# Patient Record
Sex: Male | Born: 1935 | Race: White | Hispanic: No | Marital: Married | Smoking: Former smoker
Health system: Southern US, Community
[De-identification: ages and names within clinical notes are randomized; demographics above are authoritative.]

## PROBLEM LIST (undated history)

## (undated) DIAGNOSIS — N184 Chronic kidney disease, stage 4 (severe): Secondary | ICD-10-CM

## (undated) DIAGNOSIS — I2510 Atherosclerotic heart disease of native coronary artery without angina pectoris: Secondary | ICD-10-CM

## (undated) DIAGNOSIS — E1142 Type 2 diabetes mellitus with diabetic polyneuropathy: Secondary | ICD-10-CM

## (undated) DIAGNOSIS — Z8673 Personal history of transient ischemic attack (TIA), and cerebral infarction without residual deficits: Secondary | ICD-10-CM

## (undated) DIAGNOSIS — E785 Hyperlipidemia, unspecified: Secondary | ICD-10-CM

## (undated) DIAGNOSIS — J449 Chronic obstructive pulmonary disease, unspecified: Secondary | ICD-10-CM

## (undated) DIAGNOSIS — N400 Enlarged prostate without lower urinary tract symptoms: Secondary | ICD-10-CM

## (undated) DIAGNOSIS — G4733 Obstructive sleep apnea (adult) (pediatric): Secondary | ICD-10-CM

## (undated) DIAGNOSIS — I5042 Chronic combined systolic (congestive) and diastolic (congestive) heart failure: Secondary | ICD-10-CM

## (undated) DIAGNOSIS — Z87442 Personal history of urinary calculi: Secondary | ICD-10-CM

## (undated) DIAGNOSIS — I4510 Unspecified right bundle-branch block: Secondary | ICD-10-CM

## (undated) DIAGNOSIS — Z8669 Personal history of other diseases of the nervous system and sense organs: Secondary | ICD-10-CM

## (undated) DIAGNOSIS — I252 Old myocardial infarction: Secondary | ICD-10-CM

## (undated) DIAGNOSIS — R350 Frequency of micturition: Secondary | ICD-10-CM

## (undated) DIAGNOSIS — K219 Gastro-esophageal reflux disease without esophagitis: Secondary | ICD-10-CM

## (undated) DIAGNOSIS — I493 Ventricular premature depolarization: Secondary | ICD-10-CM

## (undated) DIAGNOSIS — M1990 Unspecified osteoarthritis, unspecified site: Secondary | ICD-10-CM

## (undated) DIAGNOSIS — I10 Essential (primary) hypertension: Secondary | ICD-10-CM

## (undated) DIAGNOSIS — C679 Malignant neoplasm of bladder, unspecified: Secondary | ICD-10-CM

## (undated) DIAGNOSIS — E119 Type 2 diabetes mellitus without complications: Secondary | ICD-10-CM

## (undated) DIAGNOSIS — Z87898 Personal history of other specified conditions: Secondary | ICD-10-CM

## (undated) HISTORY — DX: Ventricular premature depolarization: I49.3

## (undated) HISTORY — DX: Atherosclerotic heart disease of native coronary artery without angina pectoris: I25.10

## (undated) HISTORY — PX: APPENDECTOMY: SHX54

## (undated) HISTORY — DX: Malignant neoplasm of bladder, unspecified: C67.9

## (undated) HISTORY — PX: CARDIAC CATHETERIZATION: SHX172

## (undated) HISTORY — DX: Benign prostatic hyperplasia without lower urinary tract symptoms: N40.0

## (undated) HISTORY — PX: CORONARY ARTERY BYPASS GRAFT: SHX141

---

## 1996-01-09 HISTORY — PX: TRANSURETHRAL RESECTION OF PROSTATE: SHX73

## 1998-10-10 ENCOUNTER — Ambulatory Visit (HOSPITAL_BASED_OUTPATIENT_CLINIC_OR_DEPARTMENT_OTHER): Admission: RE | Admit: 1998-10-10 | Discharge: 1998-10-10 | Payer: Self-pay | Admitting: Orthopedic Surgery

## 2004-05-01 ENCOUNTER — Encounter: Admission: RE | Admit: 2004-05-01 | Discharge: 2004-05-01 | Payer: Self-pay | Admitting: Family Medicine

## 2008-01-09 HISTORY — PX: RETINAL DETACHMENT SURGERY: SHX105

## 2008-01-09 HISTORY — PX: CATARACT EXTRACTION W/ INTRAOCULAR LENS  IMPLANT, BILATERAL: SHX1307

## 2008-12-16 ENCOUNTER — Encounter: Payer: Self-pay | Admitting: Emergency Medicine

## 2008-12-16 ENCOUNTER — Ambulatory Visit: Payer: Self-pay | Admitting: Cardiology

## 2008-12-16 ENCOUNTER — Encounter: Payer: Self-pay | Admitting: Cardiology

## 2008-12-16 ENCOUNTER — Inpatient Hospital Stay (HOSPITAL_COMMUNITY): Admission: AD | Admit: 2008-12-16 | Discharge: 2008-12-21 | Payer: Self-pay | Admitting: Cardiology

## 2008-12-16 ENCOUNTER — Encounter: Payer: Self-pay | Admitting: Cardiovascular Disease

## 2008-12-17 ENCOUNTER — Ambulatory Visit: Payer: Self-pay | Admitting: Cardiothoracic Surgery

## 2008-12-17 ENCOUNTER — Encounter: Payer: Self-pay | Admitting: Cardiology

## 2008-12-19 ENCOUNTER — Encounter (INDEPENDENT_AMBULATORY_CARE_PROVIDER_SITE_OTHER): Payer: Self-pay | Admitting: Internal Medicine

## 2008-12-20 ENCOUNTER — Encounter: Payer: Self-pay | Admitting: Cardiology

## 2008-12-21 ENCOUNTER — Encounter: Payer: Self-pay | Admitting: Cardiology

## 2008-12-22 ENCOUNTER — Telehealth: Payer: Self-pay | Admitting: Cardiology

## 2008-12-23 ENCOUNTER — Ambulatory Visit: Payer: Self-pay | Admitting: Cardiology

## 2008-12-28 ENCOUNTER — Telehealth: Payer: Self-pay | Admitting: Cardiology

## 2008-12-28 ENCOUNTER — Encounter: Payer: Self-pay | Admitting: Cardiology

## 2009-01-05 DIAGNOSIS — E1165 Type 2 diabetes mellitus with hyperglycemia: Secondary | ICD-10-CM

## 2009-01-05 DIAGNOSIS — J984 Other disorders of lung: Secondary | ICD-10-CM

## 2009-01-05 DIAGNOSIS — E785 Hyperlipidemia, unspecified: Secondary | ICD-10-CM | POA: Insufficient documentation

## 2009-01-05 DIAGNOSIS — I10 Essential (primary) hypertension: Secondary | ICD-10-CM | POA: Insufficient documentation

## 2009-01-05 DIAGNOSIS — E118 Type 2 diabetes mellitus with unspecified complications: Secondary | ICD-10-CM

## 2009-01-05 DIAGNOSIS — I6350 Cerebral infarction due to unspecified occlusion or stenosis of unspecified cerebral artery: Secondary | ICD-10-CM

## 2009-01-05 DIAGNOSIS — E1149 Type 2 diabetes mellitus with other diabetic neurological complication: Secondary | ICD-10-CM | POA: Insufficient documentation

## 2009-01-05 DIAGNOSIS — Z87898 Personal history of other specified conditions: Secondary | ICD-10-CM

## 2009-01-05 DIAGNOSIS — Z87891 Personal history of nicotine dependence: Secondary | ICD-10-CM

## 2009-01-05 DIAGNOSIS — I214 Non-ST elevation (NSTEMI) myocardial infarction: Secondary | ICD-10-CM

## 2009-01-05 DIAGNOSIS — M19019 Primary osteoarthritis, unspecified shoulder: Secondary | ICD-10-CM | POA: Insufficient documentation

## 2009-01-06 ENCOUNTER — Ambulatory Visit: Payer: Self-pay | Admitting: Cardiology

## 2009-01-06 DIAGNOSIS — I5032 Chronic diastolic (congestive) heart failure: Secondary | ICD-10-CM

## 2009-01-12 ENCOUNTER — Encounter: Payer: Self-pay | Admitting: Cardiology

## 2009-02-02 ENCOUNTER — Encounter: Payer: Self-pay | Admitting: Cardiology

## 2009-02-02 ENCOUNTER — Ambulatory Visit: Payer: Self-pay | Admitting: Cardiothoracic Surgery

## 2009-02-02 ENCOUNTER — Encounter: Admission: RE | Admit: 2009-02-02 | Discharge: 2009-02-02 | Payer: Self-pay | Admitting: Cardiothoracic Surgery

## 2009-02-07 ENCOUNTER — Inpatient Hospital Stay (HOSPITAL_COMMUNITY): Admission: RE | Admit: 2009-02-07 | Discharge: 2009-02-12 | Payer: Self-pay | Admitting: Cardiothoracic Surgery

## 2009-02-07 ENCOUNTER — Ambulatory Visit: Payer: Self-pay | Admitting: Cardiothoracic Surgery

## 2009-02-16 ENCOUNTER — Encounter: Payer: Self-pay | Admitting: Cardiology

## 2009-02-17 ENCOUNTER — Telehealth (INDEPENDENT_AMBULATORY_CARE_PROVIDER_SITE_OTHER): Payer: Self-pay | Admitting: *Deleted

## 2009-02-23 ENCOUNTER — Encounter: Admission: RE | Admit: 2009-02-23 | Discharge: 2009-02-23 | Payer: Self-pay | Admitting: Cardiothoracic Surgery

## 2009-02-23 ENCOUNTER — Ambulatory Visit: Payer: Self-pay | Admitting: Cardiothoracic Surgery

## 2009-02-25 ENCOUNTER — Ambulatory Visit: Payer: Self-pay | Admitting: Cardiology

## 2009-02-25 DIAGNOSIS — I2510 Atherosclerotic heart disease of native coronary artery without angina pectoris: Secondary | ICD-10-CM | POA: Insufficient documentation

## 2009-03-02 ENCOUNTER — Encounter: Admission: RE | Admit: 2009-03-02 | Discharge: 2009-03-02 | Payer: Self-pay | Admitting: Cardiothoracic Surgery

## 2009-03-02 ENCOUNTER — Ambulatory Visit: Payer: Self-pay | Admitting: Cardiothoracic Surgery

## 2009-03-10 ENCOUNTER — Encounter (HOSPITAL_COMMUNITY): Admission: RE | Admit: 2009-03-10 | Discharge: 2009-06-08 | Payer: Self-pay | Admitting: Cardiology

## 2009-03-16 ENCOUNTER — Ambulatory Visit: Payer: Self-pay | Admitting: Cardiothoracic Surgery

## 2009-03-16 ENCOUNTER — Encounter: Admission: RE | Admit: 2009-03-16 | Discharge: 2009-03-16 | Payer: Self-pay | Admitting: Cardiothoracic Surgery

## 2009-03-25 ENCOUNTER — Ambulatory Visit: Payer: Self-pay | Admitting: Cardiology

## 2009-03-28 ENCOUNTER — Telehealth: Payer: Self-pay | Admitting: Cardiology

## 2009-03-28 ENCOUNTER — Ambulatory Visit: Payer: Self-pay | Admitting: Cardiology

## 2009-03-28 ENCOUNTER — Inpatient Hospital Stay (HOSPITAL_COMMUNITY): Admission: EM | Admit: 2009-03-28 | Discharge: 2009-03-31 | Payer: Self-pay | Admitting: Emergency Medicine

## 2009-03-28 LAB — CONVERTED CEMR LAB
CO2: 28 meq/L (ref 19–32)
Chloride: 107 meq/L (ref 96–112)
GFR calc non Af Amer: 88.07 mL/min (ref >60)
Pro B Natriuretic peptide (BNP): 1106 pg/mL — ABNORMAL HIGH (ref 0.0–100.0)

## 2009-03-29 ENCOUNTER — Encounter: Payer: Self-pay | Admitting: Cardiology

## 2009-04-04 ENCOUNTER — Encounter: Payer: Self-pay | Admitting: Cardiology

## 2009-04-05 ENCOUNTER — Ambulatory Visit: Payer: Self-pay | Admitting: Cardiology

## 2009-04-06 LAB — CONVERTED CEMR LAB
ALT: 18 units/L (ref 0–53)
AST: 15 units/L (ref 0–37)
Albumin: 3.1 g/dL — ABNORMAL LOW (ref 3.5–5.2)
Alkaline Phosphatase: 77 units/L (ref 39–117)
BUN: 20 mg/dL (ref 6–23)
CO2: 30 meq/L (ref 19–32)
Calcium: 9.2 mg/dL (ref 8.4–10.5)
GFR calc non Af Amer: 48.84 mL/min (ref >60)
Glucose, Bld: 202 mg/dL — ABNORMAL HIGH (ref 70–99)
LDL Cholesterol: 87 mg/dL (ref 0–99)
Total Protein: 6.7 g/dL (ref 6.0–8.3)
Triglycerides: 131 mg/dL (ref 0.0–149.0)

## 2009-04-08 ENCOUNTER — Telehealth: Payer: Self-pay | Admitting: Cardiology

## 2009-04-09 ENCOUNTER — Emergency Department (HOSPITAL_COMMUNITY): Admission: EM | Admit: 2009-04-09 | Discharge: 2009-04-10 | Payer: Self-pay | Admitting: Emergency Medicine

## 2009-04-14 ENCOUNTER — Telehealth: Payer: Self-pay | Admitting: Cardiology

## 2009-04-22 ENCOUNTER — Telehealth: Payer: Self-pay | Admitting: Cardiology

## 2009-05-04 ENCOUNTER — Telehealth (INDEPENDENT_AMBULATORY_CARE_PROVIDER_SITE_OTHER): Payer: Self-pay | Admitting: *Deleted

## 2009-05-04 ENCOUNTER — Encounter: Payer: Self-pay | Admitting: Cardiology

## 2009-05-17 ENCOUNTER — Ambulatory Visit: Payer: Self-pay | Admitting: Cardiology

## 2009-05-17 DIAGNOSIS — M79609 Pain in unspecified limb: Secondary | ICD-10-CM

## 2009-05-19 ENCOUNTER — Encounter: Payer: Self-pay | Admitting: Cardiology

## 2009-05-19 ENCOUNTER — Telehealth (INDEPENDENT_AMBULATORY_CARE_PROVIDER_SITE_OTHER): Payer: Self-pay | Admitting: *Deleted

## 2009-05-31 ENCOUNTER — Encounter: Payer: Self-pay | Admitting: Cardiology

## 2009-06-09 ENCOUNTER — Encounter (HOSPITAL_COMMUNITY): Admission: RE | Admit: 2009-06-09 | Discharge: 2009-06-17 | Payer: Self-pay | Admitting: Cardiology

## 2009-06-24 ENCOUNTER — Telehealth: Payer: Self-pay | Admitting: Cardiology

## 2009-07-13 ENCOUNTER — Ambulatory Visit (HOSPITAL_COMMUNITY): Admission: RE | Admit: 2009-07-13 | Discharge: 2009-07-13 | Payer: Self-pay | Admitting: Urology

## 2009-07-13 HISTORY — PX: OTHER SURGICAL HISTORY: SHX169

## 2009-07-22 ENCOUNTER — Encounter: Payer: Self-pay | Admitting: Cardiology

## 2009-08-15 ENCOUNTER — Ambulatory Visit: Payer: Self-pay | Admitting: Cardiology

## 2009-08-15 DIAGNOSIS — R55 Syncope and collapse: Secondary | ICD-10-CM | POA: Insufficient documentation

## 2009-08-17 ENCOUNTER — Encounter: Payer: Self-pay | Admitting: Cardiology

## 2009-08-17 ENCOUNTER — Ambulatory Visit: Payer: Self-pay

## 2009-08-22 LAB — CONVERTED CEMR LAB
Albumin: 3.6 g/dL (ref 3.5–5.2)
Alkaline Phosphatase: 79 units/L (ref 39–117)
Bilirubin, Direct: 0.1 mg/dL (ref 0.0–0.3)
Calcium: 9.5 mg/dL (ref 8.4–10.5)
Cholesterol: 238 mg/dL — ABNORMAL HIGH (ref 0–200)
Glucose, Bld: 148 mg/dL — ABNORMAL HIGH (ref 70–99)
HDL: 33.7 mg/dL — ABNORMAL LOW (ref >39.00)
Sodium: 144 meq/L (ref 135–145)
Total CHOL/HDL Ratio: 7
VLDL: 46.4 mg/dL — ABNORMAL HIGH (ref 0.0–40.0)

## 2009-08-30 ENCOUNTER — Encounter: Payer: Self-pay | Admitting: Cardiology

## 2009-09-05 ENCOUNTER — Encounter: Payer: Self-pay | Admitting: Cardiology

## 2009-09-07 ENCOUNTER — Ambulatory Visit: Payer: Self-pay | Admitting: Cardiology

## 2009-09-15 ENCOUNTER — Telehealth: Payer: Self-pay | Admitting: Cardiology

## 2009-09-15 LAB — CONVERTED CEMR LAB
BUN: 25 mg/dL — ABNORMAL HIGH (ref 6–23)
CO2: 32 meq/L (ref 19–32)
Calcium: 9.5 mg/dL (ref 8.4–10.5)
Chloride: 92 meq/L — ABNORMAL LOW (ref 96–112)
Creatinine, Ser: 1.4 mg/dL (ref 0.4–1.5)
GFR calc non Af Amer: 51.97 mL/min (ref >60)
Glucose, Bld: 273 mg/dL — ABNORMAL HIGH (ref 70–99)
Potassium: 2.7 meq/L — CL (ref 3.5–5.1)
Pro B Natriuretic peptide (BNP): 120 pg/mL — ABNORMAL HIGH (ref 0.0–100.0)
Sodium: 136 meq/L (ref 135–145)

## 2009-09-19 ENCOUNTER — Ambulatory Visit: Payer: Self-pay | Admitting: Cardiology

## 2009-09-27 LAB — CONVERTED CEMR LAB
GFR calc non Af Amer: 61.91 mL/min (ref >60)
Glucose, Bld: 276 mg/dL — ABNORMAL HIGH (ref 70–99)
Potassium: 4.7 meq/L (ref 3.5–5.1)
Sodium: 137 meq/L (ref 135–145)

## 2009-10-19 ENCOUNTER — Ambulatory Visit: Payer: Self-pay

## 2009-12-13 ENCOUNTER — Ambulatory Visit: Payer: Self-pay | Admitting: Cardiology

## 2009-12-21 ENCOUNTER — Encounter: Payer: Self-pay | Admitting: Cardiology

## 2009-12-23 ENCOUNTER — Ambulatory Visit: Payer: Self-pay | Admitting: Cardiology

## 2009-12-26 LAB — CONVERTED CEMR LAB
BUN: 11 mg/dL (ref 6–23)
Calcium: 9 mg/dL (ref 8.4–10.5)
Chloride: 103 meq/L (ref 96–112)
Creatinine, Ser: 0.9 mg/dL (ref 0.4–1.5)
Pro B Natriuretic peptide (BNP): 918.5 pg/mL — ABNORMAL HIGH (ref 0.0–100.0)

## 2009-12-28 ENCOUNTER — Encounter: Payer: Self-pay | Admitting: Cardiology

## 2010-01-03 ENCOUNTER — Telehealth: Payer: Self-pay | Admitting: Cardiology

## 2010-01-05 ENCOUNTER — Ambulatory Visit: Payer: Self-pay | Admitting: Cardiology

## 2010-01-11 ENCOUNTER — Telehealth: Payer: Self-pay | Admitting: Cardiology

## 2010-01-11 LAB — CONVERTED CEMR LAB
BUN: 23 mg/dL
CO2: 30 meq/L
Calcium: 9.6 mg/dL
Chloride: 95 meq/L — ABNORMAL LOW
Creatinine, Ser: 1.3 mg/dL
GFR calc non Af Amer: 56.98 mL/min — ABNORMAL LOW
Glucose, Bld: 322 mg/dL — ABNORMAL HIGH
Potassium: 5.3 meq/L — ABNORMAL HIGH
Pro B Natriuretic peptide (BNP): 199.1 pg/mL — ABNORMAL HIGH
Sodium: 133 meq/L — ABNORMAL LOW

## 2010-01-20 ENCOUNTER — Ambulatory Visit: Admit: 2010-01-20 | Payer: Self-pay

## 2010-02-05 LAB — CONVERTED CEMR LAB
BUN: 17 mg/dL (ref 6–23)
CO2: 27 meq/L (ref 19–32)
CO2: 29 meq/L (ref 19–32)
Calcium: 10 mg/dL (ref 8.4–10.5)
Calcium: 9.3 mg/dL (ref 8.4–10.5)
Chloride: 101 meq/L (ref 96–112)
Chloride: 103 meq/L (ref 96–112)
GFR calc non Af Amer: 57.65 mL/min (ref >60)
Glucose, Bld: 185 mg/dL — ABNORMAL HIGH (ref 70–99)
Glucose, Bld: 187 mg/dL — ABNORMAL HIGH (ref 70–99)
Potassium: 4.1 meq/L (ref 3.5–5.1)
Potassium: 4.1 meq/L (ref 3.5–5.1)
Pro B Natriuretic peptide (BNP): 214 pg/mL — ABNORMAL HIGH (ref 0.0–100.0)
Pro B Natriuretic peptide (BNP): 626 pg/mL — ABNORMAL HIGH (ref 0.0–100.0)
Sodium: 139 meq/L (ref 135–145)
Sodium: 139 meq/L (ref 135–145)

## 2010-02-09 NOTE — Assessment & Plan Note (Signed)
Summary: PER CHECK OUT/SF   Primary Provider:  Lupe Carney, MD  CC:  rov.  Pt did not have labs on Aug 1st.  .  History of Present Illness: 76 yo with history of CAD s/p NSTEMI accompanied by pulmonary edema and peri-catheterization CVA, NSTEMI s/p CABG in July 2011presents for followup post-CABG in Feb 2011 and recently admitted for hematuria s/p surgical resection of bladder tumor and stricture dilatation.  Reports having pressure like sensation on the left side of his chest, started 2 months ago and happens about once in 2 weeks. A typical episode lasting a few minutes. No aggravating or relieving factors. Not associated with walking, hurrying into things or eating. He reports increase in his exercise tolerance and says that he can walk about a mile in a day where he used to walk just 400 yards previously.  Patient also reports having an episode of syncope which was unprovoked. He describes that he fainted and hit his head in the kitchen, he was down for unknown time. No associated palpitations, chest pain, vertigo noted. No loss of bladder or bowel movements. This is the first time he had any thing like this.  No other complaints.  Labs (5/11): BNP 520, K 4.1, creatinine 1.0  Patient was seen with resident physican Lars Mage    Current Medications (verified): 1)  Acetaminophen 325 Mg Tabs (Acetaminophen) .... As Needed 2)  Aspirin Ec 325 Mg Tbec (Aspirin) .... Take One Tablet By Mouth Daily 3)  Coreg 25 Mg Tabs (Carvedilol) .Marland Kitchen.. 1 By Mouth Two Times A Day 4)  Furosemide 40 Mg Tabs (Furosemide) .... One Tablet in The Morning and One-Half Tablet in The Evening 5)  Nitrostat 0.4 Mg Subl (Nitroglycerin) .Marland Kitchen.. 1 Tablet Under Tongue At Onset of Chest Pain; You May Repeat Every 5 Minutes For Up To 3 Doses. 6)  Potassium Chloride Crys Cr 20 Meq Cr-Tabs (Potassium Chloride Crys Cr) .Marland Kitchen.. 1 By Mouth Two Times A Day 7)  Lipitor 80 Mg Tabs (Atorvastatin Calcium) .Marland Kitchen.. 1 By Mouth Daily 8)  Co  Q-10 30 Mg  Caps (Coenzyme Q10) .Marland Kitchen.. 1 Tab By Mouth Once Daily 9)  Fish Oil   Oil (Fish Oil) .Marland Kitchen.. 1 Tab By Mouth Once Daily 10)  Glipizide Xl 10 Mg Xr24h-Tab (Glipizide) .Marland Kitchen.. 1 Tab By Mouth Once Daily 11)  Lantus 100 Unit/ml Soln (Insulin Glargine) .... 30 Untis Once Daily 12)  Metformin Hcl 500 Mg Tabs (Metformin Hcl) .... 2 Tabs By Mouth Two Times A Day 13)  Vitamin C 500 Mg  Tabs (Ascorbic Acid) .Marland Kitchen.. 1 Tab By Mouth Once Daily 14)  Vitamin D2 400 .Marland Kitchen.. 1 Tab By Mouth Once Daily-Out 15)  Vita-Plus E 400 Unit Caps (Vitamin E) .Marland Kitchen.. 1 Tab By Mouth Once Daily 16)  Gabapentin 300 Mg Caps (Gabapentin) .... One Tablet Twice A Day 17)  Nabumetone 500 Mg Tabs (Nabumetone) .Marland Kitchen.. 1 By Mouth Two Times A Day 18)  Lisinopril 40 Mg Tabs (Lisinopril) .Marland Kitchen.. 1 By Mouth Daily 19)  Tamsulosin Hcl 0.4 Mg Caps (Tamsulosin Hcl) .Marland Kitchen.. 1 By Mouth Daily  Allergies (verified): No Known Drug Allergies  Past History:  Past Medical History: 1. HYPERTENSION (ICD-401.9) 2. DYSLIPIDEMIA (ICD-272.4) 3. BENIGN PROSTATIC HYPERTROPHY, HX OF (ICD-V13.8) 4. DIABETIC PERIPHERAL NEUROPATHY (ICD-250.60) 5. DM (ICD-250.00) 6. OSTEOARTHRITIS, SHOULDER, RIGHT (ICD-715.91) 7. TOBACCO USE, QUIT (ICD-V15.82) 8. CAD: NSTEMI (12/10).  LHC showed sequential 90% mid LAD stenoses and 90% stenosis at the mid to distal LAD, D1 totally occluded, OM1 subtotally  occluded, 70% mid CFX, EF 50%.  CABG (1/11): LIMA-LAD, SVG-D, SVG-OM, SVG-PLV.  9.  CVA: peri-catheterization in 12/10.  Patient initially developed double vision and MRI showed right dorsal mid-brain infarct.  Carotid dopplers with no significant disease.  Double vision has resolved.  10.  Diastolic CHF: Pulmonary edema with NSTEMI.  Echo (12/10) showed EF 50-55% with mid to apical septal HK and mild mitral regurgitation.  Echo (3/11) when admitted with CHF exacerbation: Moderate LVH, EF 60%, valves ok.  11. Bladder cancer s/p resection 7/11    Family History: Reviewed history from  01/06/2009 and no changes required. Father died at age 28 from an embolic cerebrovascular accident from a deep vein thrombosis in his lower extremity.  His  brother died in his 2s from similar circumstances of an embolic CVA from a lower extremity deep vein thrombosis.  Mother died at age 57 without health problems prior to that.  The patient has a sister who died in her 58s from sequelae of a war injury.  He has a sister who is alive and is 35 years old with osteoarthritis.  He has 3 sons who are alive and healthy.   Social History: Reviewed history from 01/06/2009 and no changes required. The patient is married.  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  He has had a distant smoking history, 50-pack years and quit 20 years ago.  He drinks alcohol occasionally.  He does not use drugs.   Review of Systems       All systems reviewed and negative except as per HPI.   Vital Signs:  Patient profile:   76 year old male Height:      71 inches Weight:      200 pounds BMI:     28.00 Pulse rate:   75 / minute Pulse rhythm:   regular BP sitting:   118 / 68  (left arm) Cuff size:   large  Vitals Entered By: Judithe Modest CMA (August 15, 2009 11:46 AM)  Physical Exam  General:  Well developed, well nourished, in no acute distress. Neck:  Neck supple,JVP 7 cm. No masses, thyromegaly or abnormal cervical nodes.                                                                                                                                       Lungs:  Clear bilaterally to auscultation and percussion. Heart:  Non-displaced PMI, chest non-tender; regular rate and rhythm, S1, S2 without murmurs, rubs or gallops. Carotid upstroke normal, no bruit.  Pedals normal pulses. No edema.  Abdomen:  Bowel sounds positive; abdomen soft and non-tender without masses, organomegaly, or hernias noted. No hepatosplenomegaly. Extremities:  No clubbing or cyanosis. Neurologic:   Alert and oriented x 3. Psych:  Normal affect.   Impression & Recommendations:  Problem # 1:  SYNCOPE (ICD-780.2) Concerning  given his cardiac history.  EF has been normal by echo.  Will set him up for a 3 week event monitor.  He is on a beta blocker.   Problem # 2:  CORONARY ATHEROSLERO UNSPEC TYPE BYPASS GRAFT (ICD-414.05) Status post CABG, stable with occasional atypical CP.  He will continue ASA, Coreg, ACEI, statin.    Problem # 3:  DIASTOLIC HEART FAILURE, CHRONIC (ICD-428.32) Euvolemic on exam.  His symptoms are N YHA class II. Continue current Lasix and low salt diet. BMET/BNP today.   Problem # 4:  DYSLIPIDEMIA (ICD-272.4) Check lipids/LFTs today, goal LDL < 70.   Other Orders: TLB-BMP (Basic Metabolic Panel-BMET) (80048-METABOL) TLB-BNP (B-Natriuretic Peptide) (83880-BNPR) TLB-Lipid Panel (80061-LIPID) TLB-Hepatic/Liver Function Pnl (80076-HEPATIC) Event (Event)  Patient Instructions: 1)  Your physician recommends that you return for a FASTING lipid profile/liver profile/BMP/BNP 780.2 414.05 428.32 2)  Your physician has recommended that you wear an event monitor.  Event monitors are medical devices that record the heart's electrical activity. Doctors most often use these monitors to diagnose arrhythmias. Arrhythmias are problems with the speed or rhythm of the heartbeat. The monitor is a small, portable device. You can wear one while you do your normal daily activities. This is usually used to diagnose what is causing palpitations/syncope (passing out). 3 WEEK 3)  Your physician recommends that you schedule a follow-up appointment in: 4 months with Dr Shirlee Latch.

## 2010-02-09 NOTE — Assessment & Plan Note (Signed)
Summary: 4 MONTH ROV   Visit Type:  Follow-up Primary Joshua Vincent:  Joshua Carney, MD  CC:  The pt has been off all his medications for the last 3 weeks except for his Lantus. He does have some SOB. No edema to lower extremities. He states he has gained about 12 pounds in 2 weeks. Pt is pending a consultation with Dr. Janee Morn on Dec. 14 for hernia surgery.Marland Kitchen  History of Present Illness: 76 yo with history of CAD s/p NSTEMI accompanied by pulmonary edema and peri-catheterization CVA, now s/p CABG presents for followup.  He has also had significant diastolic CHF.   Joshua Vincent has been off all his medications except insulin for the last 3 weeks because they are too expensive.  He has gained 12 lbs and is more short of breath.  He can walk about 100 feet and then has to stop.  No ortthopnea or PND.  No chest pain.  He is still working as a Electrical engineer.  SBP has been as high as the 170s off meds.  It is 148/72 today.  Also of note, he has a large inguinal hernia.  Repair has been recommended.    Labs (5/11): BNP 520, K 4.1, creatinine 1.0 Labs (8/11): BNP 120, HDL 34, LDL 148 Labs (9/11): K 4.7, creatinine 1.2  ECG: NSR, old ASMI   Current Medications (verified): 1)  Acetaminophen 325 Mg Tabs (Acetaminophen) .... As Needed 2)  Aspirin Ec 325 Mg Tbec (Aspirin) .... Take One Tablet By Mouth Daily 3)  Nitrostat 0.4 Mg Subl (Nitroglycerin) .Marland Kitchen.. 1 Tablet Under Tongue At Onset of Chest Pain; You May Repeat Every 5 Minutes For Up To 3 Doses. 4)  Fish Oil   Oil (Fish Oil) .Marland Kitchen.. 1 Tab By Mouth Once Daily 5)  Lantus 100 Unit/ml Soln (Insulin Glargine) .... 30 Untis Once Daily 6)  Vitamin C 500 Mg  Tabs (Ascorbic Acid) .Marland Kitchen.. 1 Tab By Mouth Once Daily 7)  Vitamin D2 400 .Marland Kitchen.. 1 Tab By Mouth Once Daily-Out 8)  Vita-Plus E 400 Unit Caps (Vitamin E) .Marland Kitchen.. 1 Tab By Mouth Once Daily 9)  Nabumetone 500 Mg Tabs (Nabumetone) .Marland Kitchen.. 1 By Mouth Two Times A Day 10)  Simvastatin 40 Mg Tabs (Simvastatin) .... One in The  Evening 11)  Amlodipine Besylate 10 Mg Tabs (Amlodipine Besylate) .... One Daily 12)  Chlorthalidone 25 Mg Tabs (Chlorthalidone) .... One Daily 13)  Doxazosin Mesylate 8 Mg Tabs (Doxazosin Mesylate) .... One Daily 14)  Klor-Con M20 20 Meq Cr-Tabs (Potassium Chloride Crys Cr) .... Two Tablets  Twice A Day 15)  Glucotrol 10 Mg Tabs (Glipizide) .... Take One Tab By Mouth Once Daily 16)  Metformin Hcl 500 Mg Tabs (Metformin Hcl) .... Take One Tab By Mouth Two Times A Day  Allergies (verified): No Known Drug Allergies  Past History:  Past Medical History: 1. HYPERTENSION (ICD-401.9) 2. DYSLIPIDEMIA (ICD-272.4) 3. BENIGN PROSTATIC HYPERTROPHY, HX OF (ICD-V13.8) 4. DIABETIC PERIPHERAL NEUROPATHY (ICD-250.60) 5. DM (ICD-250.00) 6. OSTEOARTHRITIS, SHOULDER, RIGHT (ICD-715.91) 7. TOBACCO USE, QUIT (ICD-V15.82) 8. CAD: NSTEMI (12/10).  LHC showed sequential 90% mid LAD stenoses and 90% stenosis at the mid to distal LAD, D1 totally occluded, OM1 subtotally occluded, 70% mid CFX, EF 50%.  CABG (1/11): LIMA-LAD, SVG-D, SVG-OM, SVG-PLV.  9.  CVA: peri-catheterization in 12/10.  Patient initially developed double vision and MRI showed right dorsal mid-brain infarct.  Carotid dopplers with no significant disease.  Double vision has resolved.  10.  Diastolic CHF: Pulmonary edema with  NSTEMI.  Echo (12/10) showed EF 50-55% with mid to apical septal HK and mild mitral regurgitation.  Echo (3/11) when admitted with CHF exacerbation: Moderate LVH, EF 60%, valves ok.  11. Bladder cancer s/p resection 7/11 12. 3 week event monitor (8/11): no significant arrhythmias, occasional PVCs    Family History: Reviewed history from 01/06/2009 and no changes required. Father died at age 70 from an embolic cerebrovascular accident from a deep vein thrombosis in his lower extremity.  His  brother died in his 48s from similar circumstances of an embolic CVA from a lower extremity deep vein thrombosis.  Mother died at age 38  without health problems prior to that.  The patient has a sister who died in her 58s from sequelae of a war injury.  He has a sister who is alive and is 26 years old with osteoarthritis.  He has 3 sons who are alive and healthy.   Social History: Reviewed history from 01/06/2009 and no changes required. The patient is married.  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  He has had a distant smoking history, 50-pack years and quit 20 years ago.  He drinks alcohol occasionally.  He does not use drugs.   Review of Systems       All systems negative except as per HPI.   Vital Signs:  Patient profile:   76 year old male Height:      71 inches Weight:      201 pounds BMI:     28.14 Pulse rate:   83 / minute Pulse rhythm:   regular Resp:     18 per minute BP sitting:   148 / 72  (left arm) Cuff size:   large  Vitals Entered By: Sherri Rad, RN, BSN (December 13, 2009 11:05 AM)  Physical Exam  General:  Well developed, well nourished, in no acute distress. Neck:  Neck supple, JVP 8 cm. No masses, thyromegaly or abnormal cervical nodes.                                                                                                                                       Lungs:  Clear bilaterally to auscultation and percussion. Heart:  Non-displaced PMI, chest non-tender; regular rate and rhythm, S1, S2 without murmurs, rubs or gallops. Carotid upstroke normal, no bruit.  Pedals normal pulses. 1+ ankle edema. Abdomen:  Bowel sounds positive; abdomen soft and non-tender without masses, organomegaly, or hernias noted. No hepatosplenomegaly. Extremities:  No clubbing or cyanosis. Neurologic:  Alert and oriented x 3. Psych:  Normal affect.   Impression & Recommendations:  Problem # 1:  DIASTOLIC HEART FAILURE, CHRONIC (ICD-428.32) Patient has gained 12 lbs off Lasix for the last 3 weeks.  He is not as volume overloaded on exam as I would expect, however.   He does have NYHA class  III symptoms.  We had a long talk about med compliance today.  I gave him a list of meds that he absolutely needs to continue for his heart.  All are generic.  He will take them.   - Restart Lasix 40 mg qam, 20 mg qpm and KCl 20 mEq two times a day.   - BMET/BNP in 10 days.   Problem # 2:  CORONARY ATHEROSLERO UNSPEC TYPE BYPASS GRAFT (ICD-414.05) No chest pain.  Needs to restart Lipitor 80 mg daily (can get at generic pricing now) and ASA 162 mg daily.  Lipids/LFTs in 2 months with goal LDL < 70.   Problem # 3:  HYPERTENSION (ICD-401.9) I will have him restart Coreg at 12.5 mg two times a day (1/2 of prior dose) and lisinopril at 20 mg daily (1/2 of prior dose).  He will followup in 2 wks.  Can reassess BP at that time.    Other Orders: EKG w/ Interpretation (93000)  Patient Instructions: 1)  Your physician has recommended you make the following change in your medication:  2)  Take Lasix(furosemide) 40mg  in the morning and 20mg  in the evening--this will be one 40mg  tablet in the morning and one-half 40mg  tablet in the evening. 3)  Take KCL(potassium) 20 mEq twice a day. 4)  Take Coreg(carvedilol) 12.5mg  twice a day. 5)  Take Aspirin 162mg  daily--this will be two 81mg  tablets daily. 6)  Take Lisinopril 20mg  daily. 7)  Start Lipitor 80mg  daily. 8)  Your physician recommends that you return for lab work in: 10 days---BMP/BNP 414.05 428.32 9)  Your physician recommends that you schedule a follow-up appointment in: 2 weeks with Dr Shirlee Latch. Prescriptions: COREG 12.5 MG TABS (CARVEDILOL) one twice a day  #60 x 6   Entered by:   Katina Dung, RN, BSN   Authorized by:   Marca Ancona, MD   Signed by:   Katina Dung, RN, BSN on 12/13/2009   Method used:   Electronically to        [facility] (retail)       [phone]       [place], [place]  [address]       Ph: [phone]       Fax: [phone]   RxID:    [id number] LISINOPRIL 20 MG TABS (LISINOPRIL) one daily  #30 x 6   Entered by:   Katina Dung, RN, BSN   Authorized by:   Marca Ancona, MD   Signed by:   Katina Dung, RN, BSN on 12/13/2009   Method used:   Electronically to        [facility] (retail)       [phone]       [place], [place]  [address]       Ph: [phone]       Fax: [phone]   RxID:   [id number] FUROSEMIDE 40 MG TABS (FUROSEMIDE) one in the morning and one-half in the evening  #45 x 6   Entered by:   Katina Dung, RN, BSN   Authorized by:   Marca Ancona, MD   Signed by:   Katina Dung, RN, BSN on 12/13/2009   Method used:   Electronically to        [facility] (retail)       [phone]  [place], [place]  [address]       Ph: [phone]       Fax: [phone]   RxID:   [id number] KLOR-CON M20 20 MEQ CR-TABS (POTASSIUM CHLORIDE CRYS CR) one  tablet  twice a day  #60 x 6   Entered by:   Katina Dung, RN, BSN   Authorized by:   Marca Ancona, MD   Signed by:   Katina Dung, RN, BSN on 12/13/2009   Method used:   Electronically to        [facility] (retail)       [phone]       [place], [place]  [address]       Ph: [phone]       Fax: [phone]   RxID:   [id number] LIPITOR 80 MG TABS (ATORVASTATIN CALCIUM) one daily  #90 x 3   Entered by:   Katina Dung, RN, BSN   Authorized by:   Marca Ancona, MD   Signed by:   Katina Dung, RN, BSN on 12/13/2009   Method used:   Print then Give to Patient   RxID:   [id number]

## 2010-02-09 NOTE — Letter (Signed)
Summary: [facility]  [employer] Authorization   Imported By: Roderic Ovens 04/07/2009 12:35:52  _____________________________________________________________________  External Attachment:    Type:   Image     Comment:   External Document

## 2010-02-09 NOTE — Progress Notes (Signed)
Summary: medication  ---- Converted from flag ---- ---- 01/11/2010 1:53 PM, Judie Grieve wrote: Pt wanted calling want you to know that she has got the medication ------------------------------

## 2010-02-09 NOTE — Progress Notes (Signed)
Summary: req call back  Phone Note Call from Patient Call back at Home Phone [phone]   Caller: Patient Reason for Call: Talk to Nurse Summary of Call: request call from nurse, would not state what call was about Initial call taken by: Migdalia Dk,  April 08, 2009 8:08 AM  Follow-up for Phone Call        Spoke with pt. Patient states he has some pressure on upper right corner or right lung (scorer of 3). He feels weak in his thigh, also has a charlie horse on left calf. Pt. states these symptoms  started  yesterday evening. Pt. denies SOB, no change on pressure on inspiration or expiration. Pt. denies wt. gain. RN advised pt. to call his PCP so he can be checked out. Pt. states will call  Dr. Lupe Carney his PCP. Pt. to  call the office back if needed. Ollen Gross, RN, BSN  April 08, 2009 8:54 AM

## 2010-02-09 NOTE — Letter (Signed)
Summary: [facility] Office Visit Note   [facility] Office Visit Note   Imported By: Roderic Ovens 08/02/2009 15:46:05  _____________________________________________________________________  External Attachment:    Type:   Image     Comment:   External Document

## 2010-02-09 NOTE — Letter (Signed)
Summary: [facility]   Imported By: Roderic Ovens 02/11/2009 10:44:36  _____________________________________________________________________  External Attachment:    Type:   Image     Comment:   External Document

## 2010-02-09 NOTE — Progress Notes (Signed)
Summary: PT WANT TO STOP HIS CARDIAC REHAB  Phone Note Call from Patient Call back at Home Phone [phone]   Caller: Patient Summary of Call: PT WANT DR.Estanislao Harmon TO STOP HIS CARDIAC REHAB Initial call taken by: Judie Grieve,  April 14, 2009 2:39 PM  Follow-up for Phone Call        talked with patient --pt wants to cancel Cardiac Rehab for now-he thinks it is just "too much" for him right now,--he wants Dr Shirlee Latch to call Cardiac Rehab and cancel--I advised pt that Dr Shirlee Latch would enourage him to continue participation in Cardiac Rehab-I told him if he decided to discontinue Cardiac Rehab he should let them know

## 2010-02-09 NOTE — Progress Notes (Signed)
Summary: B/P readings  Phone Note Outgoing Call   Call placed by: Katina Dung, RN, BSN,  April 22, 2009 8:34 AM Call placed to: Patient Summary of Call: B/P readings  Follow-up for Phone Call        Hydralazine and Imdur stopped 04-05-09--LMVM for pt to call me to get B/P readings Luana Shu  Pt returning call would like a call before 4:00 Judie Grieve  April 22, 2009 2:29 PM pt recently in hospital--B/P 112/66 today--per pt -this is about what his B/P has been--he will continue to monitor his B/P and let me know is any significant changes

## 2010-02-09 NOTE — Progress Notes (Signed)
Summary: Records request from [facility] [facility]  Request for records received from [facility]. Request forwarded to [facility].Dena Chavis  February 17, 2009 3:30 PM

## 2010-02-09 NOTE — Progress Notes (Signed)
Summary: pls clarify k+ direction & dosage  Phone Note From Other Clinic   Caller: luanne office [phone] Request: Talk with Nurse Summary of Call: pt states he's taken k+ two 20 mg twice a day per dr. Shirlee Latch instruction. pls clarify.  Initial call taken by: Lorne Skeens,  January 03, 2010 2:56 PM  Follow-up for Phone Call        spoke with Luanne and let her know that Dr Shirlee Latch did order two times a day  She states pt h as admitted to her he has not been consistant with taking medications as he should Dennis Bast, RN, BSN  January 03, 2010 3:09 PM Pt has an appointment with Dr Shirlee Latch on 01/05/10

## 2010-02-09 NOTE — Progress Notes (Signed)
  Faxed LOv over to [facility] to fax [phone] [facility]  May 04, 2009 11:53 AM    Appended Document: low B/P at Cardiac Rehab 05-04-09 B/P low at Cardiac Rehab from  faxed report from 05-04-09 Dr Shirlee Latch reviewed B/P readings and recommended pt D/C Norvasc and decrease Lasix to 40mg  daily--I talked with pt by telephone and pt verbalized understanding -he will continue to monitor B/P   Clinical Lists Changes  Medications: Changed medication from FUROSEMIDE 40 MG TABS (FUROSEMIDE) Take one tablet by mouth two times a day to FUROSEMIDE 40 MG TABS (FUROSEMIDE) Take one tablet by mouth daily Removed medication of AMLODIPINE BESYLATE 10 MG TABS (AMLODIPINE BESYLATE) Take one tablet by mouth daily-OUT    will forward report to Medical Records to be scanned

## 2010-02-09 NOTE — Assessment & Plan Note (Signed)
Summary: `1 month rov/sl   Primary Provider:  Lupe Carney, MD   History of Present Illness: 76 yo with history of CAD s/p NSTEMI accompanied by pulmonary edema and peri-catheterization CVA presents for followup post-CABG.  He was recently admitted with a diastolic CHF exacerbation for diuresis.  He says that his breathing is now back to normal.  He is not short of breath walking on flat ground and can climb steps without shortness of breath.  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  He says that rehab three times a week is too much with his work.  BP is 108/58 today.  He is having some lightheadedness with standing and wants to know if he can stop some of his medications.  No chest pain.  He thinks that gabapentin has been helping the neuropathic-type pain down his arm.  Echo showed moderate LVH and preserved LV systolic function.    Labs (12/10): K 4, creatinine 1.3, LDL 76, HDL 46 Labs (2/11): BNP 626, creatinine 0.9 Labs (3/11): K 3.9, creatinine 0.9, BNP 1106 Labs (04/05/09): K 4, creatinine 1.5, BNP 365, LDL 87, HDL 33, TGs 131  Current Medications (verified): 1)  Acetaminophen 325 Mg Tabs (Acetaminophen) .... As Needed 2)  Aspirin Ec 325 Mg Tbec (Aspirin) .... Take One Tablet By Mouth Daily 3)  Coreg 25 Mg Tabs (Carvedilol) .Marland Kitchen.. 1 By Mouth Two Times A Day 4)  Furosemide 40 Mg Tabs (Furosemide) .... Take One Tablet Once Daily-Out 5)  Isosorbide Mononitrate Cr 30 Mg Xr24h-Tab (Isosorbide Mononitrate) .... Take One Tablet By Mouth Daily-Out 6)  Nitrostat 0.4 Mg Subl (Nitroglycerin) .Marland Kitchen.. 1 Tablet Under Tongue At Onset of Chest Pain; You May Repeat Every 5 Minutes For Up To 3 Doses. 7)  Amlodipine Besylate 10 Mg Tabs (Amlodipine Besylate) .... Take One Tablet By Mouth Daily-Out 8)  Potassium Chloride Crys Cr 20 Meq Cr-Tabs (Potassium Chloride Crys Cr) .Marland Kitchen.. 1 By Mouth Two Times A Day 9)  Lipitor 80 Mg Tabs (Atorvastatin Calcium) .Marland Kitchen.. 1 By  Mouth Daily 10)  Co Q-10 30 Mg  Caps (Coenzyme Q10) .Marland Kitchen.. 1 Tab By Mouth Once Daily 11)  Fish Oil   Oil (Fish Oil) .Marland Kitchen.. 1 Tab By Mouth Once Daily 12)  Glipizide Xl 10 Mg Xr24h-Tab (Glipizide) .Marland Kitchen.. 1 Tab By Mouth Once Daily 13)  Lantus 100 Unit/ml Soln (Insulin Glargine) .... 30 Untis Once Daily 14)  Metformin Hcl 500 Mg Tabs (Metformin Hcl) .... 2 Tabs By Mouth Two Times A Day 15)  Vitamin C 500 Mg  Tabs (Ascorbic Acid) .Marland Kitchen.. 1 Tab By Mouth Once Daily 16)  Vitamin D2 400 .Marland Kitchen.. 1 Tab By Mouth Once Daily-Out 17)  Vita-Plus E 400 Unit Caps (Vitamin E) .Marland Kitchen.. 1 Tab By Mouth Once Daily 18)  Gabapentin 300 Mg Caps (Gabapentin) .... One Tablet Twice A Day 19)  Hydralazine Hcl 25 Mg Tabs (Hydralazine Hcl) .Marland Kitchen.. 1 By Mouth Three Times A Day 20)  Nabumetone 500 Mg Tabs (Nabumetone) .Marland Kitchen.. 1 By Mouth Two Times A Day 21)  Lisinopril 40 Mg Tabs (Lisinopril) .Marland Kitchen.. 1 By Mouth Daily 22)  Tamsulosin Hcl 0.4 Mg Caps (Tamsulosin Hcl) .Marland Kitchen.. 1 By Mouth Daily  Allergies (verified): No Known Drug Allergies  Past History:  Past Medical History: 1. HYPERTENSION (ICD-401.9) 2. DYSLIPIDEMIA (ICD-272.4) 3. BENIGN PROSTATIC HYPERTROPHY, HX OF (ICD-V13.8) 4. DIABETIC PERIPHERAL NEUROPATHY (ICD-250.60) 5. DM (ICD-250.00) 6. OSTEOARTHRITIS, SHOULDER, RIGHT (ICD-715.91) 7. TOBACCO  USE, QUIT (ICD-V15.82) 8. CAD: NSTEMI (12/10).  LHC showed sequential 90% mid LAD stenoses and 90% stenosis at the mid to distal LAD, D1 totally occluded, OM1 subtotally occluded, 70% mid CFX, EF 50%.  CABG (1/11): LIMA-LAD, SVG-D, SVG-OM, SVG-PLV.  9.  CVA: peri-catheterization in 12/10.  Patient initially developed double vision and MRI showed right dorsal mid-brain infarct.  Carotid dopplers with no significant disease.  Double vision has resolved.  10.  Diastolic CHF: Pulmonary edema with NSTEMI.  Echo (12/10) showed EF 50-55% with mid to apical septal HK and mild mitral regurgitation.  Echo (3/11) when admitted with CHF exacerbation: Moderate LVH,  EF 60%, valves ok.     Family History: Reviewed history from 01/06/2009 and no changes required. Father died at age 16 from an embolic cerebrovascular accident from a deep vein thrombosis in his lower extremity.  His  brother died in his 30s from similar circumstances of an embolic CVA from a lower extremity deep vein thrombosis.  Mother died at age 24 without health problems prior to that.  The patient has a sister who died in her 49s from sequelae of a war injury.  He has a sister who is alive and is 74 years old with osteoarthritis.  He has 3 sons who are alive and healthy.   Social History: Reviewed history from 01/06/2009 and no changes required. The patient is married.  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  He has had a distant smoking history, 50-pack years and quit 20 years ago.  He drinks alcohol occasionally.  He does not use drugs.   Review of Systems       All systems reviewed and negative except as per HPI.   Vital Signs:  Patient profile:   76 year old male Height:      71 inches Weight:      202 pounds BMI:     28.28 Pulse rate:   75 / minute Resp:     16 per minute BP sitting:   108 / 58  (right arm)  Vitals Entered By: Marrion Coy, CNA (April 05, 2009 8:22 AM)  Physical Exam  General:  Well developed, well nourished, in no acute distress. Neck:  Neck supple, no JVD. No masses, thyromegaly or abnormal cervical nodes. Lungs:  Clear bilaterally to auscultation and percussion. Heart:  Non-displaced PMI, chest non-tender; regular rate and rhythm, S1, S2 without murmurs, rubs or gallops. Carotid upstroke normal, no bruit.  Pedals normal pulses. 2+ edema to knees bilaterally. Trace ankle edema.  Abdomen:  Bowel sounds positive; abdomen soft and non-tender without masses, organomegaly, or hernias noted. No hepatosplenomegaly. Extremities:  No clubbing or cyanosis. Neurologic:  Alert and oriented x 3. Psych:  Normal  affect.   Impression & Recommendations:  Problem # 1:  CORONARY ATHEROSLERO UNSPEC TYPE BYPASS GRAFT (ICD-414.05) Status post CABG, no recurrent chest pain.  He will continue ASA, Coreg, ACEI, statin.  Three times a week rehab is getting too difficult for him with work.  I encouraged him to at least stay with it for 2 times a week.   Problem # 2:  DIASTOLIC HEART FAILURE, CHRONIC (ICD-428.32) Patient was recently admitted with diastolic CHF exacerbation.  He is doing better now, NYHA class II.  He is not volume overloaded on exam.  We have cut his Lasix back to 40 mg two times a day.  His creatinine is 1.5 today, which is increased for  him.  Will repeat BMP in 10 days to see if creatinine has decreased on lower Lasix dose.  K is stable.  - Continue Lasix 40 mg two times a day for now.  - Keep BP controlled.   Problem # 3:  HYPERTENSION (ICD-401.9) BP is actually on the low side today and patient has some lightheadedness with standing.  He wants to come off some of his medications if possible.  I will let him stop hydralazine and Imdur.  He will check BP daily and we will call in 2 wks to see what BP is running.   Problem # 4:  DYSLIPIDEMIA (ICD-272.4) HDL low and LDL a little high.  He has been scared of possible Crestor side effects so I have put him on Lipitor 80 instead. He needs to work on diet and exercise.  If LDL stays too high (> 70), will likely want him to go back to Crestor 40.    Other Orders: TLB-BMP (Basic Metabolic Panel-BMET) (80048-METABOL) TLB-BNP (B-Natriuretic Peptide) (83880-BNPR) TLB-Lipid Panel (80061-LIPID) TLB-Hepatic/Liver Function Pnl (80076-HEPATIC)  Patient Instructions: 1)  Your physician recommends that you schedule a follow-up appointment in: 6 weeks 2)  Your physician has requested that you regularly monitor and record your blood pressure readings at home.  Please use the same machine at the same time of day to check your readings and record them to bring  to your follow-up visit. We will call you in about 2 weeks to see how your blood pressure is doing 3)  Your physician has recommended you make the following change in your medication: Stop Imdur. Stop Hydralazine

## 2010-02-09 NOTE — Assessment & Plan Note (Signed)
Summary: per check out/saf   Primary Provider:  Lupe Carney, MD  CC:  pt feeling very SOB with exertion.  Pt is having numbness and tingling on the left side of his body.  Specifically fingers.  History of Present Illness: 76 yo with history of CAD s/p NSTEMI accompanied by pulmonary edema and per-catheterization CVA presents for followup post-CABG.  Patient had no cardiac history until 12/10 when he presented to [facility] with NSTEMI.  He also had acute diastolic CHF with pulmonary edema.  He had a left heart cath showing severe diffuse disease in the LAD as well as significant CFX system disease.  The decision was made to proceed with CABG.  Unfortunately, post-cath he developed double vision and was found to have an acute right dorsal mid-brain stroke, likely related to the cath.  He was diuresed and stabilized with no further chest pain.  The decision was made to delay CABG 4-6 weeks to allow recovery from the stroke.  His diplopia cleared up.  He had CABG x 4 on 02/07/09.    Since bypass, patient reports severe pain shooting down from his posterior neck to his 4th and 5th fingers of the left arm.  This has been very bothersome.  He additionally is short of breath after walking about 50 yards.  He has orthopnea and sleeps on 3-4 pillows or in his recliner.  No chest pain other than very mild soreness at his sternotomy.  Weight is 3 lbs greater than pre-CABG.   Labs (12/10): K 4, creatinine 1.3, LDL 76, HDL 46 Labs (2/11): BNP 626, creatinine 0.9  ECG: NSR, probable old ASMI, anterior T wave inversions   Current Medications (verified): 1)  Acetaminophen 325 Mg Tabs (Acetaminophen) .... As Needed 2)  Aspirin Ec 325 Mg Tbec (Aspirin) .... Take One Tablet By Mouth Daily 3)  Carvedilol 12.5 Mg Tabs (Carvedilol) .... Take One Tablet By Mouth Twice A Day 4)  Docusate Sodium 100 Mg Caps (Docusate Sodium) .Marland Kitchen.. 1 Tab By Mouth Two Times A Day 5)  Furosemide 40 Mg Tabs (Furosemide) .... One Tablet  in The Morning and One-Half Tablet in The Evening For 5 Days Then One Tablet in The Morning 6)  Isosorbide Mononitrate Cr 30 Mg Xr24h-Tab (Isosorbide Mononitrate) .... Take One Tablet By Mouth Daily 7)  Nitrostat 0.4 Mg Subl (Nitroglycerin) .Marland Kitchen.. 1 Tablet Under Tongue At Onset of Chest Pain; You May Repeat Every 5 Minutes For Up To 3 Doses. 8)  Amlodipine Besylate 10 Mg Tabs (Amlodipine Besylate) .... Take One Tablet By Mouth Daily 9)  Potassium Chloride Crys Cr 20 Meq Cr-Tabs (Potassium Chloride Crys Cr) .... Take Two Tablets By Mouth Daily For 5 Days The One Tablet Daily 10)  Crestor 40 Mg Tabs (Rosuvastatin Calcium) .Marland Kitchen.. 1 Tab Op Once Daily-Pt Has Not Started Taking This 11)  Co Q-10 30 Mg  Caps (Coenzyme Q10) .Marland Kitchen.. 1 Tab By Mouth Once Daily 12)  Fish Oil   Oil (Fish Oil) .Marland Kitchen.. 1 Tab By Mouth Once Daily 13)  Glipizide Xl 10 Mg Xr24h-Tab (Glipizide) .Marland Kitchen.. 1 Tab By Mouth Once Daily 14)  Lantus 100 Unit/ml Soln (Insulin Glargine) .... 30 Untis Once Daily 15)  Metformin Hcl 500 Mg Tabs (Metformin Hcl) .... 2 Tabs By Mouth Two Times A Day 16)  Quinapril Hcl 40 Mg Tabs (Quinapril Hcl) .Marland Kitchen.. 1 Tab By Mouth Once Daily 17)  Vitamin C 500 Mg  Tabs (Ascorbic Acid) .Marland Kitchen.. 1 Tab By Mouth Once Daily 18)  Vitamin  D2 400 .Marland Kitchen.. 1 Tab By Mouth Once Daily 19)  Vita-Plus E 400 Unit Caps (Vitamin E) .Marland Kitchen.. 1 Tab By Mouth Once Daily 20)  Gabapentin 300 Mg Caps (Gabapentin) .... One Tablet Twice A Day  Allergies (verified): No Known Drug Allergies  Past History:  Past Medical History: 1. HYPERTENSION (ICD-401.9) 2. DYSLIPIDEMIA (ICD-272.4) 3. BENIGN PROSTATIC HYPERTROPHY, HX OF (ICD-V13.8) 4. DIABETIC PERIPHERAL NEUROPATHY (ICD-250.60) 5. DM (ICD-250.00) 6. OSTEOARTHRITIS, SHOULDER, RIGHT (ICD-715.91) 7. TOBACCO USE, QUIT (ICD-V15.82) 8. CAD: NSTEMI (12/10).  LHC showed sequential 90% mid LAD stenoses and 90% stenosis at the mid to distal LAD, D1 totally occluded, OM1 subtotally occluded, 70% mid CFX, EF 50%.  CABG  (1/11): LIMA-LAD, SVG-D, SVG-OM, SVG-PLV.  9.  CVA: peri-catheterization in 12/10.  Patient initially developed double vision and MRI showed right dorsal mid-brain infarct.  Carotid dopplers with no significant disease.  Double vision has resolved.  10.  Diastolic CHF: Pulmonary edema with NSTEMI.  Echo (12/10) showed EF 50-55% with mid to apical septal HK and mild mitral regurgitation.      Family History: Reviewed history from 01/06/2009 and no changes required. Father died at age 62 from an embolic cerebrovascular accident from a deep vein thrombosis in his lower extremity.  His  brother died in his 75s from similar circumstances of an embolic CVA from a lower extremity deep vein thrombosis.  Mother died at age 30 without health problems prior to that.  The patient has a sister who died in her 91s from sequelae of a war injury.  He has a sister who is alive and is 21 years old with osteoarthritis.  He has 3 sons who are alive and healthy.   Social History: Reviewed history from 01/06/2009 and no changes required. The patient is married.  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  He has had a distant smoking history, 50-pack years and quit 20 years ago.  He drinks alcohol occasionally.  He does not use drugs.   Review of Systems       All systems reviewed and negative except as per HPI.   Vital Signs:  Patient profile:   76 year old male Height:      71 inches Weight:      210 pounds BMI:     29.39 Pulse rate:   76 / minute Pulse rhythm:   regular BP sitting:   172 / 88  (left arm)  Vitals Entered By: Judithe Modest CMA (February 25, 2009 1:53 PM)  Physical Exam  General:  Well developed, well nourished, in no acute distress. Neck:  Neck supple, JVP 8-9 cm. No masses, thyromegaly or abnormal cervical nodes. Chest Wall:  Midline sternotomy is healing well.  Lungs:  Bronchial breath sounds left base.  Heart:  Non-displaced PMI, chest non-tender;  regular rate and rhythm, S1, S2 without murmurs, rubs or gallops. Carotid upstroke normal, no bruit.  Pedals normal pulses. 2+ edema to knees bilaterally.  Abdomen:  Bowel sounds positive; abdomen soft and non-tender without masses, organomegaly, or hernias noted. No hepatosplenomegaly. Extremities:  No clubbing or cyanosis. Neurologic:  Alert and oriented x 3. Psych:  Normal affect.   Impression & Recommendations:  Problem # 1:  CORONARY ATHEROSLERO UNSPEC TYPE BYPASS GRAFT (ICD-414.05) Status post CABG, no recurrent chest pain.  He will continue ASA, Coreg, ACEI, and Crestor.   Problem # 2:  DIASTOLIC HEART FAILURE, CHRONIC (ICD-428.32) Patient is volume overloaded on exam  today with increased BNP.  I will increase his Lasix to 40 mg qam and 20 mg qpm for 5 days, then go to 40 mg daily until he sees me again.  Increase KCl to 40 mEq daily for 5 days, then 20 mEq daily.  Followup with me in 1 month with a BMET and BNP prior to appointment.   Problem # 3:  HYPERTENSION (ICD-401.9) Patient's BP is high today.  He has not taken all his meds and he is in a lot of pain from his left arm.  Will not change his BP meds today but will follow closely.  His Coreg has room to increase.   Problem # 4:  LEFT ARM PAIN Patient's pain sounds neuropathic. It runs from the posterior neck like an electric shock to the ulnar side of the left hand.  It has been fairly constant.  I will have him start gabapentin 300 mg two times a day.  If this does not help, will need further evaluation.   Other Orders: EKG w/ Interpretation (93000) TLB-BNP (B-Natriuretic Peptide) (83880-BNPR) TLB-BMP (Basic Metabolic Panel-BMET) (80048-METABOL)  Patient Instructions: 1)  Your physician has recommended you make the following change in your medication:  2)  Increase Lasix to 40mg  in the morning and 20mg  in the evening for 5 days then decrease to 40mg  in the morning 3)  Increase KCL (potassium) to two  a day for 5 days  then decrease to one a day 4)  Start Gabapentin 300mg  twice a day 5)  Your physician recommends that you have lab today---BMP/BNP 414.05 428.32 6)  Your physician recommends that you schedule a follow-up appointment in: 1 month---have lab tests a few days before the appointment with Dr Shirlee Latch in 1 month---BMP/BNP 414.05 428.32 7)  Your physician recommends referral and attendance at a Cardiac Rehab Program.  Prescriptions: GABAPENTIN 300 MG CAPS (GABAPENTIN) one tablet twice a day  #180 x 3   Entered by:   Katina Dung, RN, BSN   Authorized by:   Marca Ancona, MD   Signed by:   Katina Dung, RN, BSN on 02/25/2009   Method used:   Print then Give to Patient   RxID:   [id number] QUINAPRIL HCL 40 MG TABS (QUINAPRIL HCL) 1 tab by mouth once daily  #90 x 3   Entered by:   Katina Dung, RN, BSN   Authorized by:   Marca Ancona, MD   Signed by:   Katina Dung, RN, BSN on 02/25/2009   Method used:   Print then Give to Patient   RxID:   [id number] CRESTOR 40 MG TABS (ROSUVASTATIN CALCIUM) 1 tab op once daily-pt has not started taking this  #90 x 3   Entered by:   Katina Dung, RN, BSN   Authorized by:   Marca Ancona, MD   Signed by:   Katina Dung, RN, BSN on 02/25/2009   Method used:   Print then Give to Patient   RxID:   [id number] POTASSIUM CHLORIDE CRYS CR 20 MEQ CR-TABS (POTASSIUM CHLORIDE CRYS CR) Take two tablets by mouth daily for 5 days the one tablet daily  #90 x 3   Entered by:   Katina Dung, RN, BSN   Authorized by:   Marca Ancona, MD   Signed by:   Katina Dung, RN, BSN on 02/25/2009   Method used:   Print then Give to Patient   RxID:   [id number] AMLODIPINE BESYLATE 10 MG TABS (AMLODIPINE BESYLATE) Take one tablet by  mouth daily  #90 x 3   Entered by:   Katina Dung, RN, BSN   Authorized by:   Marca Ancona, MD   Signed by:   Katina Dung, RN, BSN on 02/25/2009   Method used:   Print then Give to Patient   RxID:    [id number] ISOSORBIDE MONONITRATE CR 30 MG XR24H-TAB (ISOSORBIDE MONONITRATE) Take one tablet by mouth daily  #90 x 3   Entered by:   Katina Dung, RN, BSN   Authorized by:   Marca Ancona, MD   Signed by:   Katina Dung, RN, BSN on 02/25/2009   Method used:   Print then Give to Patient   RxID:   [id number] FUROSEMIDE 40 MG TABS (FUROSEMIDE) One tablet in the morning and one-half tablet in the evening for 5 days then one tablet in the morning  #90 x 3   Entered by:   Katina Dung, RN, BSN   Authorized by:   Marca Ancona, MD   Signed by:   Katina Dung, RN, BSN on 02/25/2009   Method used:   Print then Give to Patient   RxID:   [id number] CARVEDILOL 12.5 MG TABS (CARVEDILOL) Take one tablet by mouth twice a day  #180 x 3   Entered by:   Katina Dung, RN, BSN   Authorized by:   Marca Ancona, MD   Signed by:   Katina Dung, RN, BSN on 02/25/2009   Method used:   Print then Give to Patient   RxID:   [id number] GABAPENTIN 300 MG CAPS (GABAPENTIN) one tablet twice a day  #60 x 6   Entered by:   Katina Dung, RN, BSN   Authorized by:   Marca Ancona, MD   Signed by:   Katina Dung, RN, BSN on 02/25/2009   Method used:   Electronically to        [facility] # [id number]* (retail)       [address]       [place], [place]  [address]       Ph: [phone] or [phone]       Fax: [phone]   RxID:   [id number] POTASSIUM CHLORIDE CRYS CR 20 MEQ CR-TABS (POTASSIUM CHLORIDE CRYS CR) Take two tablets by mouth daily for 5 days the one tablet daily  #60 x 6   Entered by:   Katina Dung, RN, BSN   Authorized by:   Marca Ancona, MD   Signed by:   Katina Dung, RN, BSN on 02/25/2009   Method used:   Electronically to        [facility]. # [id number]* (retail)       [address]       [place], [place]  [address]       Ph: [phone] or [phone]       Fax: [phone]   RxID:    [id number] FUROSEMIDE 40 MG TABS (FUROSEMIDE) One tablet in the morning and one-half tablet in the evening for 5 days then one tablet in the morning  #50 x 6   Entered by:   Katina Dung, RN, BSN   Authorized by:   Marca Ancona, MD   Signed by:   Katina Dung, RN, BSN on 02/25/2009   Method used:   Electronically to        [facility]. # [id number]* (retail)  [address]       [place], [place]  [address]       Ph: [phone] or [phone]       Fax: [phone]   RxID:   [id number]

## 2010-02-09 NOTE — Assessment & Plan Note (Signed)
Summary: 6wk f/u sl   Primary Provider:  Lupe Carney, MD  CC:  6 week follow up.  Pt is doing okay but reports that his hand on the left is still having numbness in the last 2 fingers. Also with this shooting pain and no strength.    .  History of Present Illness: 76 yo with history of CAD s/p NSTEMI accompanied by pulmonary edema and peri-catheterization CVA presents for followup post-CABG.  He was admitted post-CABG with a diastolic CHF exacerbation for diuresis.  He feels like his breathing is now close to normal.  He is not short of breath walking on flat ground and can climb steps without shortness of breath.  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  He has been doing cardiac rehab.  No chest pain.  Main complaint today is lack of strength in the 4th and 5th fingers of the left hand as well as shoooting pain down the left arm.  He does not think that gabapentin has helped much.     Labs (12/10): K 4, creatinine 1.3, LDL 76, HDL 46 Labs (2/11): BNP 626, creatinine 0.9 Labs (3/11): K 3.9, creatinine 0.9, BNP 1106 Labs (04/05/09): K 4, creatinine 1.5, BNP 365, LDL 87, HDL 33, TGs 131 Labs (5/11): creatinine 1.0, K 4.0  ECG: NSR, old ASMI  Current Medications (verified): 1)  Acetaminophen 325 Mg Tabs (Acetaminophen) .... As Needed 2)  Aspirin Ec 325 Mg Tbec (Aspirin) .... Take One Tablet By Mouth Daily 3)  Coreg 25 Mg Tabs (Carvedilol) .Marland Kitchen.. 1 By Mouth Two Times A Day 4)  Furosemide 40 Mg Tabs (Furosemide) .... Take One Tablet By Mouth Daily 5)  Nitrostat 0.4 Mg Subl (Nitroglycerin) .Marland Kitchen.. 1 Tablet Under Tongue At Onset of Chest Pain; You May Repeat Every 5 Minutes For Up To 3 Doses. 6)  Potassium Chloride Crys Cr 20 Meq Cr-Tabs (Potassium Chloride Crys Cr) .Marland Kitchen.. 1 By Mouth Two Times A Day 7)  Lipitor 80 Mg Tabs (Atorvastatin Calcium) .Marland Kitchen.. 1 By Mouth Daily 8)  Co Q-10 30 Mg  Caps (Coenzyme Q10) .Marland Kitchen.. 1 Tab By Mouth Once Daily 9)  Fish Oil    Oil (Fish Oil) .Marland Kitchen.. 1 Tab By Mouth Once Daily 10)  Glipizide Xl 10 Mg Xr24h-Tab (Glipizide) .Marland Kitchen.. 1 Tab By Mouth Once Daily 11)  Lantus 100 Unit/ml Soln (Insulin Glargine) .... 30 Untis Once Daily 12)  Metformin Hcl 500 Mg Tabs (Metformin Hcl) .... 2 Tabs By Mouth Two Times A Day 13)  Vitamin C 500 Mg  Tabs (Ascorbic Acid) .Marland Kitchen.. 1 Tab By Mouth Once Daily 14)  Vitamin D2 400 .Marland Kitchen.. 1 Tab By Mouth Once Daily-Out 15)  Vita-Plus E 400 Unit Caps (Vitamin E) .Marland Kitchen.. 1 Tab By Mouth Once Daily 16)  Gabapentin 300 Mg Caps (Gabapentin) .... One Tablet Twice A Day 17)  Nabumetone 500 Mg Tabs (Nabumetone) .Marland Kitchen.. 1 By Mouth Two Times A Day 18)  Lisinopril 40 Mg Tabs (Lisinopril) .Marland Kitchen.. 1 By Mouth Daily 19)  Tamsulosin Hcl 0.4 Mg Caps (Tamsulosin Hcl) .Marland Kitchen.. 1 By Mouth Daily  Allergies (verified): No Known Drug Allergies  Past History:  Past Medical History: Reviewed history from 04/05/2009 and no changes required. 1. HYPERTENSION (ICD-401.9) 2. DYSLIPIDEMIA (ICD-272.4) 3. BENIGN PROSTATIC HYPERTROPHY, HX OF (ICD-V13.8) 4. DIABETIC PERIPHERAL NEUROPATHY (ICD-250.60) 5. DM (ICD-250.00) 6. OSTEOARTHRITIS, SHOULDER, RIGHT (ICD-715.91) 7. TOBACCO USE, QUIT (ICD-V15.82) 8. CAD: NSTEMI (12/10).  LHC  showed sequential 90% mid LAD stenoses and 90% stenosis at the mid to distal LAD, D1 totally occluded, OM1 subtotally occluded, 70% mid CFX, EF 50%.  CABG (1/11): LIMA-LAD, SVG-D, SVG-OM, SVG-PLV.  9.  CVA: peri-catheterization in 12/10.  Patient initially developed double vision and MRI showed right dorsal mid-brain infarct.  Carotid dopplers with no significant disease.  Double vision has resolved.  10.  Diastolic CHF: Pulmonary edema with NSTEMI.  Echo (12/10) showed EF 50-55% with mid to apical septal HK and mild mitral regurgitation.  Echo (3/11) when admitted with CHF exacerbation: Moderate LVH, EF 60%, valves ok.     Family History: Reviewed history from 01/06/2009 and no changes required. Father died at age 5  from an embolic cerebrovascular accident from a deep vein thrombosis in his lower extremity.  His  brother died in his 60s from similar circumstances of an embolic CVA from a lower extremity deep vein thrombosis.  Mother died at age 75 without health problems prior to that.  The patient has a sister who died in her 84s from sequelae of a war injury.  He has a sister who is alive and is 84 years old with osteoarthritis.  He has 3 sons who are alive and healthy.   Social History: Reviewed history from 01/06/2009 and no changes required. The patient is married.  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  He has had a distant smoking history, 50-pack years and quit 20 years ago.  He drinks alcohol occasionally.  He does not use drugs.   Review of Systems       All systems reviewed and negative except as per HPI.   Vital Signs:  Patient profile:   76 year old male Height:      71 inches Weight:      200 pounds BMI:     28.00 Pulse rate:   73 / minute Pulse rhythm:   regular BP sitting:   146 / 90  (left arm) Cuff size:   large  Vitals Entered By: Judithe Modest CMA (May 17, 2009 11:05 AM)  Physical Exam  General:  Well developed, well nourished, in no acute distress. Neck:  Neck supple,JVP 8 cm. No masses, thyromegaly or abnormal cervical nodes.                                                                                                                                       Lungs:  Clear bilaterally to auscultation and percussion. Heart:  Non-displaced PMI, chest non-tender; regular rate and rhythm, S1, S2 without murmurs, rubs or gallops. Carotid upstroke normal, no bruit.  Pedals normal pulses. Trace ankle edema.  Abdomen:  Bowel sounds positive; abdomen soft and non-tender without masses, organomegaly, or hernias noted. No hepatosplenomegaly. Extremities:  No clubbing or cyanosis. Neurologic:  Left 4th and 5th finger strength is diminished.  Psych:   Normal affect.   Impression & Recommendations:  Problem # 1:  FINGER PAIN (ICD-729.5) Patient's main complaint is shooting pain down the left arm and weakness in the left 4th and 5th digits.  This seems neuropathic.  Gabapentin has not helped.  I am going to refer to neurology for evaluation, may need nerve conduction studies for a firm diagnosis.   Problem # 2:  CORONARY ATHEROSLERO UNSPEC TYPE BYPASS GRAFT (ICD-414.05) Status post CABG, no recurrent chest pain.  He will continue ASA, Coreg, ACEI, statin.  Three times a week rehab is getting too difficult for him with work.  I encouraged him to at least stay with it for 2 times a week.   Problem # 3:  DIASTOLIC HEART FAILURE, CHRONIC (ICD-428.32) Patient has mild volume overload on exam with significantly elevated BNP.  His symptoms are N YHA class II.  I am going to go ahead and increase his Lasix to 40 qam, 20 qpm to try to keep volume off more effectively. He needs to keep a low salt diet.    Problem # 4:  HYPERTENSION (ICD-401.9) Mild BP elevation today.  SBP in 130s for the most part at home.  Continue current meds.   Problem # 5:  DYSLIPIDEMIA (ICD-272.4) Goal LDL < 70.  He is on a high dose statin.  Repeat lipids/LFTs before next appointment.   Other Orders: EKG w/ Interpretation (93000) Neurology Referral (Neuro) TLB-BNP (B-Natriuretic Peptide) (83880-BNPR) TLB-BMP (Basic Metabolic Panel-BMET) (80048-METABOL)  Patient Instructions: 1)  Your physician recommends that you have lab today---BMP/BNP 414.05 428.32 2)  You have been referred to Neurololgy for your finger pain 3)  Your physician recommends that you schedule a follow-up appointment in: 3 months with Dr Shirlee Latch.

## 2010-02-09 NOTE — Miscellaneous (Signed)
Summary: [facility] Order/Treatment Plan  [facility] Order[facility]Treatment Plan   Imported By: Roderic Ovens 02/25/2009 11:52:20  _____________________________________________________________________  External Attachment:    Type:   Image     Comment:   External Document

## 2010-02-09 NOTE — Miscellaneous (Signed)
Summary: [facility] Progress Note  [facility] Progress Note   Imported By: Roderic Ovens 05/27/2009 11:12:05  _____________________________________________________________________  External Attachment:    Type:   Image     Comment:   External Document

## 2010-02-09 NOTE — Letter (Signed)
Summary: [facility] Clear[facility]   Imported By: Roderic Ovens 01/19/2010 16:27:27  _____________________________________________________________________  External Attachment:    Type:   Image     Comment:   External Document

## 2010-02-09 NOTE — Procedures (Signed)
Summary: Holter and Event  Hol[facility] and Event   Imported By: Erle Crocker 09/26/2009 11:53:53  _____________________________________________________________________  External Attachment:    Type:   Image     Comment:   External Document

## 2010-02-09 NOTE — Letter (Signed)
Summary: [facility] Office Visit Note   [facility] Office Visit Note   Imported By: Roderic Ovens 09/05/2009 15:58:37  _____________________________________________________________________  External Attachment:    Type:   Image     Comment:   External Document

## 2010-02-09 NOTE — Progress Notes (Signed)
Summary: SOB BLOOD SUGAR DROP  Phone Note Call from Patient Call back at Home Phone [phone]   Caller: Patient Summary of Call: PT HAVING SOB,BLOOD SUGAR DROP TO 70 Initial call taken by: Judie Grieve,  March 28, 2009 8:08 AM  Follow-up for Phone Call        talked with patient--pt states he has been SOB over the week-end and he has gained 4 pounds since Friday--he feels congested in his chest and it hurts when he coughs--he is requesting  an appt with Dr Shirlee Latch rather than make Lasix adjustments over the telephone--I will review with Dr Shirlee Latch the pt also noted his glucose was down to 70 --pt states Dr Clovis Riley manages his diabetes and he will follow-up with him about his glucose Katina Dung, RN, BSN  March 28, 2009 9:43 AM  discussed with Dr Ronn Melena today at 3:45 pt aware

## 2010-02-09 NOTE — Progress Notes (Signed)
  FAxed LOV,12 lead over to [facility] to fax [phone] [facility]  May 19, 2009 2:04 PM

## 2010-02-09 NOTE — Assessment & Plan Note (Signed)
Summary: rov   Primary Provider:  Lupe Carney, MD  CC:  pt comes in today SOB. Marland Kitchen  History of Present Illness: 76 yo with history of CAD s/p NSTEMI accompanied by pulmonary edema and peri-catheterization CVA presents for followup post-CABG.  Patient had no cardiac history until 12/10 when he presented to [facility] with NSTEMI.  He also had acute diastolic CHF with pulmonary edema.  He had a left heart cath showing severe diffuse disease in the LAD as well as significant CFX system disease.  The decision was made to proceed with CABG.  Unfortunately, post-cath he developed double vision and was found to have an acute right dorsal mid-brain stroke, likely related to the cath.  He was diuresed and stabilized with no further chest pain.  The decision was made to delay CABG 4-6 weeks to allow recovery from the stroke.  His diplopia cleared up.  He had CABG x 4 on 02/07/09.    Since CABG, patient has had significant dyspnea with exertion.  At last appointment, I increased his Lasix to 40 mg qam and 20 mg qpm for a period of time followed by return of Lasix to 40 mg daily.  Patient had been stably short of breath with exertion until 2 days ago.  2 days ago, he noted the onset of shortness of breath with minimal exertion.  He coughed up pink frothy sputum.  He was unable to lie flat and slept sitting up.  Yesterday, he continued to be short of breath with minimal exertion and almost called 911 but decided to wait to be seen in the office today.  He slept minimally last night.  No chest pain. He does appear comfortable at rest.  He took all his meds today but just ran out of several after today's doses. He does not want to take Crestor after seeing an ad on TV about potential side effects.  He is ok with taking Lipitor.  BP is very high today, 196/108. Oxygen saturation 93% on room air.   Labs (12/10): K 4, creatinine 1.3, LDL 76, HDL 46 Labs (2/11): BNP 626, creatinine 0.9 Labs (3/11): K 3.9, creatinine 0.9,  BNP 1106  ECG: NSR, old anterior MI.   Current Medications (verified): 1)  Acetaminophen 325 Mg Tabs (Acetaminophen) .... As Needed 2)  Aspirin Ec 325 Mg Tbec (Aspirin) .... Take One Tablet By Mouth Daily 3)  Carvedilol 12.5 Mg Tabs (Carvedilol) .... Take One Tablet By Mouth Twice A Day-Out 4)  Docusate Sodium 100 Mg Caps (Docusate Sodium) .Marland Kitchen.. 1 Tab By Mouth Two Times A Day 5)  Furosemide 40 Mg Tabs (Furosemide) .... Take One Tablet Once Daily-Out 6)  Isosorbide Mononitrate Cr 30 Mg Xr24h-Tab (Isosorbide Mononitrate) .... Take One Tablet By Mouth Daily-Out 7)  Nitrostat 0.4 Mg Subl (Nitroglycerin) .Marland Kitchen.. 1 Tablet Under Tongue At Onset of Chest Pain; You May Repeat Every 5 Minutes For Up To 3 Doses. 8)  Amlodipine Besylate 10 Mg Tabs (Amlodipine Besylate) .... Take One Tablet By Mouth Daily-Out 9)  Potassium Chloride Crys Cr 20 Meq Cr-Tabs (Potassium Chloride Crys Cr) .... Take One Tablet Once Daily 10)  Crestor 40 Mg Tabs (Rosuvastatin Calcium) .Marland Kitchen.. 1 Tab Op Once Daily-Pt Has Not Started Taking This 11)  Co Q-10 30 Mg  Caps (Coenzyme Q10) .Marland Kitchen.. 1 Tab By Mouth Once Daily 12)  Fish Oil   Oil (Fish Oil) .Marland Kitchen.. 1 Tab By Mouth Once Daily 13)  Glipizide Xl 10 Mg Xr24h-Tab (Glipizide) .Marland Kitchen.. 1 Tab  By Mouth Once Daily 14)  Lantus 100 Unit/ml Soln (Insulin Glargine) .... 30 Untis Once Daily 15)  Metformin Hcl 500 Mg Tabs (Metformin Hcl) .... 2 Tabs By Mouth Two Times A Day 16)  Quinapril Hcl 40 Mg Tabs (Quinapril Hcl) .Marland Kitchen.. 1 Tab By Mouth Once Daily 17)  Vitamin C 500 Mg  Tabs (Ascorbic Acid) .Marland Kitchen.. 1 Tab By Mouth Once Daily 18)  Vitamin D2 400 .Marland Kitchen.. 1 Tab By Mouth Once Daily-Out 19)  Vita-Plus E 400 Unit Caps (Vitamin E) .Marland Kitchen.. 1 Tab By Mouth Once Daily 20)  Gabapentin 300 Mg Caps (Gabapentin) .... One Tablet Twice A Day  Allergies (verified): No Known Drug Allergies  Past History:  Past Medical History: Reviewed history from 02/25/2009 and no changes required. 1. HYPERTENSION (ICD-401.9) 2.  DYSLIPIDEMIA (ICD-272.4) 3. BENIGN PROSTATIC HYPERTROPHY, HX OF (ICD-V13.8) 4. DIABETIC PERIPHERAL NEUROPATHY (ICD-250.60) 5. DM (ICD-250.00) 6. OSTEOARTHRITIS, SHOULDER, RIGHT (ICD-715.91) 7. TOBACCO USE, QUIT (ICD-V15.82) 8. CAD: NSTEMI (12/10).  LHC showed sequential 90% mid LAD stenoses and 90% stenosis at the mid to distal LAD, D1 totally occluded, OM1 subtotally occluded, 70% mid CFX, EF 50%.  CABG (1/11): LIMA-LAD, SVG-D, SVG-OM, SVG-PLV.  9.  CVA: peri-catheterization in 12/10.  Patient initially developed double vision and MRI showed right dorsal mid-brain infarct.  Carotid dopplers with no significant disease.  Double vision has resolved.  10.  Diastolic CHF: Pulmonary edema with NSTEMI.  Echo (12/10) showed EF 50-55% with mid to apical septal HK and mild mitral regurgitation.      Family History: Reviewed history from 01/06/2009 and no changes required. Father died at age 21 from an embolic cerebrovascular accident from a deep vein thrombosis in his lower extremity.  His  brother died in his 64s from similar circumstances of an embolic CVA from a lower extremity deep vein thrombosis.  Mother died at age 5 without health problems prior to that.  The patient has a sister who died in her 38s from sequelae of a war injury.  He has a sister who is alive and is 46 years old with osteoarthritis.  He has 3 sons who are alive and healthy.   Social History: Reviewed history from 01/06/2009 and no changes required. The patient is married.  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  He has had a distant smoking history, 50-pack years and quit 20 years ago.  He drinks alcohol occasionally.  He does not use drugs.   Review of Systems       All systems reviewed and negative except as per HPI.   Vital Signs:  Patient profile:   76 year old male Height:      71 inches Weight:      211 pounds BMI:     29.53 O2 Sat:      93 % on Room air Pulse rate:   99 /  minute Pulse rhythm:   regular BP sitting:   196 / 108  (left arm) Cuff size:   large  Vitals Entered By: Judithe Modest CMA (March 28, 2009 3:58 PM)  O2 Flow:  Room air  Physical Exam  General:  Well developed, well nourished, in no acute distress. Neck:  Neck supple, JVP 9-10 cm. No masses, thyromegaly or abnormal cervical nodes. Lungs:  Crackles 1/2 up lung fields bilaterally.  Heart:  Non-displaced PMI, chest non-tender; regular rate and rhythm, S1, S2 without murmurs, rubs or gallops. Carotid upstroke normal, no bruit.  Pedals normal pulses. 2+ edema to knees bilaterally.  Abdomen:  Bowel sounds positive; abdomen soft and non-tender without masses, organomegaly, or hernias noted. No hepatosplenomegaly. Extremities:  No clubbing or cyanosis. Neurologic:  Alert and oriented x 3. Psych:  Normal affect.   Impression & Recommendations:  Problem # 1:  DIASTOLIC HEART FAILURE, CHRONIC (ICD-428.32) Patient is volume overloaded today with NYHA class IV symptoms.  His BNP has gone up despite increased Lasix.  His BP is very high.  He is not doing well with outpatient management.  I am going to admit Mr Youngblood for IV diuresis and afterload reduction.  - NTG gtt, titrate to 150 micrograms/min (goal) to afterload reduce.  - lasix 80 mg IV q8 hrs.   - Echo to reassess LV systolic function and assess for any evidence of pericardial constriction.  - BMET, BNP today  Problem # 2:  CORONARY ATHEROSLERO UNSPEC TYPE BYPASS GRAFT (ICD-414.05) Status post CABG, no recurrent chest pain.  He will continue ASA, Coreg, ACEI, statin.  As he is concerned with possible side effects from Crestor, I will have him on Lipitor 80 mg daily instead.   Problem # 3:  CEREBROVASCULAR ACCIDENT (ICD-434.91) Patient appears to have completely recovered from his per-catheterization CVA.   Problem # 4:  HYPERTENSION (ICD-401.9) High BP is likely contributing to CHF exacerbation.  IV NTG gtt as above, continue  quinapril, amlodipine, Coreg.    Other Orders: EKG w/ Interpretation (93000)

## 2010-02-09 NOTE — Progress Notes (Signed)
Summary: returning call back--bladder surgery 07-13-09 Dr Isabel Caprice  Phone Note Call from Patient Call back at [facility] Phone [phone] Call back at [phone]   Caller: Patient Reason for Call: Talk to Nurse Summary of Call: returning call back to anne l.  Initial call taken by: Lorne Skeens,  June 24, 2009 10:10 AM  Follow-up for Phone Call        [facility] Katina Dung, RN, BSN  June 24, 2009 10:43 AM  talked with pt by telephone--he is scheduled for surgery by Dr Isabel Caprice July 6,2011 at [facility] for bladder tumors--wants to be sure Dr Shirlee Latch is aware of this--will forward to Dr Shirlee Latch      Appended Document: returning call back--bladder surgery 07-13-09 Dr Isabel Caprice If possible would stay on a baby ASA peri-operatively  Appended Document: returning call back--bladder surgery 07-13-09 Dr Isabel Caprice [facility]   Appended Document: returning call back--bladder surgery 07-13-09 Dr Isabel Caprice [facility]   Appended Document: returning call back--bladder surgery 07-13-09 Dr Isabel Caprice discussed with pt by telephone

## 2010-02-09 NOTE — Assessment & Plan Note (Signed)
Summary: per check outsf   Visit Type:  Follow-up Primary Provider:  Lupe Carney, MD  CC:  dizziness .  History of Present Illness: 76 yo with history of CAD s/p NSTEMI accompanied by pulmonary edema and peri-catheterization CVA, now s/p CABG presents for followup.  He has also had significant diastolic CHF.  He has been back on his medications now for about 3 weeks.  Weight is down about 10 lbs and BNP is also lower.  He continues to get short of breath after walking about 100-150 feet.  He is short of breath after climbing a flight of steps.  No chest pain.  Of note, he is bladder cancer-free per his urologist. SBP has been running 105-130 at home.  He needs an inguinal hernia repair.   Labs (5/11): BNP 520, K 4.1, creatinine 1.0 Labs (8/11): BNP 120, HDL 34, LDL 148 Labs (9/11): K 4.7, creatinine 1.2 Labs (12/11): K 3.3 => 5.3, creatinine 0.9 => 1.3, BNP 918 => 199   Current Medications (verified): 1)  Acetaminophen 325 Mg Tabs (Acetaminophen) .... As Needed 2)  Aspirin 81 Mg Tbec (Aspirin) .... Two Daily 3)  Nitrostat 0.4 Mg Subl (Nitroglycerin) .Marland Kitchen.. 1 Tablet Under Tongue At Onset of Chest Pain; You May Repeat Every 5 Minutes For Up To 3 Doses. 4)  Fish Oil   Oil (Fish Oil) .Marland Kitchen.. 1 Tab By Mouth Once Daily 5)  Lantus 100 Unit/ml Soln (Insulin Glargine) .... 30 Untis Once Daily 6)  Vitamin C 500 Mg  Tabs (Ascorbic Acid) .Marland Kitchen.. 1 Tab By Mouth Once Daily 7)  Vitamin D2 400 .Marland Kitchen.. 1 Tab By Mouth Once Daily-Out 8)  Vita-Plus E 400 Unit Caps (Vitamin E) .Marland Kitchen.. 1 Tab By Mouth Once Daily 9)  Nabumetone 500 Mg Tabs (Nabumetone) .Marland Kitchen.. 1 By Mouth Two Times A Day 10)  Lipitor 80 Mg Tabs (Atorvastatin Calcium) .... One Daily 11)  Doxazosin Mesylate 8 Mg Tabs (Doxazosin Mesylate) .... One Daily 12)  Klor-Con M20 20 Meq Cr-Tabs (Potassium Chloride Crys Cr) .... Two   Tablets   Twice A Day 13)  Glucotrol 10 Mg Tabs (Glipizide) .... Take One Tab By Mouth Once Daily 14)  Metformin Hcl 500 Mg Tabs  (Metformin Hcl) .... Take One Tab By Mouth Two Times A Day 15)  Furosemide 40 Mg Tabs (Furosemide) .... One in The Morning and One-Half in The Evening 16)  Lisinopril 20 Mg Tabs (Lisinopril) .... One Daily 17)  Coreg 12.5 Mg Tabs (Carvedilol) .... One Twice A Day  Allergies (verified): 1)  ! Niacin  Past History:  Past Medical History: Reviewed history from 12/13/2009 and no changes required. 1. HYPERTENSION (ICD-401.9) 2. DYSLIPIDEMIA (ICD-272.4) 3. BENIGN PROSTATIC HYPERTROPHY, HX OF (ICD-V13.8) 4. DIABETIC PERIPHERAL NEUROPATHY (ICD-250.60) 5. DM (ICD-250.00) 6. OSTEOARTHRITIS, SHOULDER, RIGHT (ICD-715.91) 7. TOBACCO USE, QUIT (ICD-V15.82) 8. CAD: NSTEMI (12/10).  LHC showed sequential 90% mid LAD stenoses and 90% stenosis at the mid to distal LAD, D1 totally occluded, OM1 subtotally occluded, 70% mid CFX, EF 50%.  CABG (1/11): LIMA-LAD, SVG-D, SVG-OM, SVG-PLV.  9.  CVA: peri-catheterization in 12/10.  Patient initially developed double vision and MRI showed right dorsal mid-brain infarct.  Carotid dopplers with no significant disease.  Double vision has resolved.  10.  Diastolic CHF: Pulmonary edema with NSTEMI.  Echo (12/10) showed EF 50-55% with mid to apical septal HK and mild mitral regurgitation.  Echo (3/11) when admitted with CHF exacerbation: Moderate LVH, EF 60%, valves ok.  11. Bladder cancer s/p resection  7/11 12. 3 week event monitor (8/11): no significant arrhythmias, occasional PVCs    Family History: Reviewed history from 01/06/2009 and no changes required. Father died at age 27 from an embolic cerebrovascular accident from a deep vein thrombosis in his lower extremity.  His  brother died in his 27s from similar circumstances of an embolic CVA from a lower extremity deep vein thrombosis.  Mother died at age 69 without health problems prior to that.  The patient has a sister who died in her 65s from sequelae of a war injury.  He has a sister who is alive and is 71 years  old with osteoarthritis.  He has 3 sons who are alive and healthy.   Social History: Reviewed history from 01/06/2009 and no changes required. The patient is married.  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  He has had a distant smoking history, 50-pack years and quit 20 years ago.  He drinks alcohol occasionally.  He does not use drugs.   Review of Systems       All systems reviewed and negative except as per HPI.   Vital Signs:  Patient profile:   76 year old male Height:      71 inches Weight:      191.75 pounds BMI:     26.84 Pulse rate:   78 / minute BP sitting:   108 / 60  (left arm) Cuff size:   regular  Vitals Entered By: Caralee Ates CMA (January 05, 2010 12:39 PM)  Physical Exam  General:  Well developed, well nourished, in no acute distress. Neck:  Neck supple, no JVD. No masses, thyromegaly or abnormal cervical nodes. Lungs:  Clear bilaterally to auscultation and percussion. Heart:  Non-displaced PMI, chest non-tender; regular rate and rhythm, S1, S2 without murmurs, rubs or gallops. Carotid upstroke normal, no bruit.  Pedals normal pulses. Trace ankle edema.  Abdomen:  Bowel sounds positive; abdomen soft and non-tender without masses, organomegaly, or hernias noted. No hepatosplenomegaly. Extremities:  No clubbing or cyanosis. Neurologic:  Alert and oriented x 3. Psych:  Normal affect.   Impression & Recommendations:  Problem # 1:  CORONARY ATHEROSLERO UNSPEC TYPE BYPASS GRAFT (ICD-414.05) Stable with no ischemic symptoms.  Continue ASA, Coreg, ACEI, and statin.   Problem # 2:  DYSLIPIDEMIA (ICD-272.4) Check lipids/LFTs in 2/12 with goal LDL < 70.   Problem # 3:  DIASTOLIC HEART FAILURE, CHRONIC (ICD-428.32) Still symptomatic (NYHA class III), but weight is down 10 lbs and BNP is down considerably.  He looks close to euvolemic on exam.  Continue current Lasix dose.  Can decrease KCl to 40 mEq daily and repeat BMET in 2 wks  given elevated potassium.   Problem # 4:  PREOPERATIVE EVALUATION Patient should be stable to undergo inguinal hernia repair as long as he stays on his cardiac medications.  Aspirin should be held for the minimum amount of time peri-operatively.   Other Orders: TLB-BMP (Basic Metabolic Panel-BMET) (80048-METABOL) TLB-BNP (B-Natriuretic Peptide) (83880-BNPR)  Patient Instructions: 1)  Your physician recommends that you return for a FASTING lipid profile: IN FEB 2)  Your physician wants you to follow-up in:4 MONTHS   You will receive a reminder letter in the mail two months in advance. If you don't receive a letter, please call our office to schedule the follow-up appointment.

## 2010-02-09 NOTE — Progress Notes (Signed)
Summary: pt rtn call to [facility]  Phone Note Call from Patient Call back at [facility] Phone [phone]   Caller: Patient Reason for Call: Talk to Nurse, Talk to Doctor Summary of Call: pt rtn call to [facility] Initial call taken by: Omer Jack,  September 15, 2009 1:01 PM  Follow-up for Phone Call        reveiwed meds with pt and discussed returning for lab--will review with Dr Shirlee Latch     New/Updated Medications: AMLODIPINE BESYLATE 10 MG TABS (AMLODIPINE BESYLATE) one daily CHLORTHALIDONE 25 MG TABS (CHLORTHALIDONE) one daily DOXAZOSIN MESYLATE 8 MG TABS (DOXAZOSIN MESYLATE) one daily KLOR-CON M20 20 MEQ CR-TABS (POTASSIUM CHLORIDE CRYS CR) two tablets  twice a day   Current Medications (verified): 1)  Acetaminophen 325 Mg Tabs (Acetaminophen) .... As Needed 2)  Aspirin Ec 325 Mg Tbec (Aspirin) .... Take One Tablet By Mouth Daily 3)  Nitrostat 0.4 Mg Subl (Nitroglycerin) .Marland Kitchen.. 1 Tablet Under Tongue At Onset of Chest Pain; You May Repeat Every 5 Minutes For Up To 3 Doses. 4)  Fish Oil   Oil (Fish Oil) .Marland Kitchen.. 1 Tab By Mouth Once Daily 5)  Lantus 100 Unit/ml Soln (Insulin Glargine) .... 30 Untis Once Daily 6)  Vitamin C 500 Mg  Tabs (Ascorbic Acid) .Marland Kitchen.. 1 Tab By Mouth Once Daily 7)  Vitamin D2 400 .Marland Kitchen.. 1 Tab By Mouth Once Daily-Out 8)  Vita-Plus E 400 Unit Caps (Vitamin E) .Marland Kitchen.. 1 Tab By Mouth Once Daily 9)  Nabumetone 500 Mg Tabs (Nabumetone) .Marland Kitchen.. 1 By Mouth Two Times A Day 10)  Simvastatin 40 Mg Tabs (Simvastatin) .... One in The Evening 11)  Amlodipine Besylate 10 Mg Tabs (Amlodipine Besylate) .... One Daily 12)  Chlorthalidone 25 Mg Tabs (Chlorthalidone) .... One Daily 13)  Doxazosin Mesylate 8 Mg Tabs (Doxazosin Mesylate) .... One Daily 14)  Klor-Con M20 20 Meq Cr-Tabs (Potassium Chloride Crys Cr) .... Two Tablets  Twice A Day  Allergies: No Known Drug Allergies reviewed with Dr McLean--pt to take KCL twice a day(two 20 mEq twice a day) and return for BMP 09/19/09   Appended  Document: pt rtn call to [facility]    Clinical Lists Changes  Medications: Rx of KLOR-CON M20 20 MEQ CR-TABS (POTASSIUM CHLORIDE CRYS CR) two tablets  twice a day;  #120 x 11;  Signed;  Entered by: Katina Dung, RN, BSN;  Authorized by: Marca Ancona, MD;  Method used: Electronically to [facility] # [id number]*, [address] [place], [place], [place]  [address], Ph: [phone] or [phone], Fax: [phone]    Prescriptions: KLOR-CON M20 20 MEQ CR-TABS (POTASSIUM CHLORIDE CRYS CR) two tablets  twice a day  #120 x 11   Entered by:   Katina Dung, RN, BSN   Authorized by:   Marca Ancona, MD   Signed by:   Katina Dung, RN, BSN on 09/19/2009   Method used:   Electronically to        [facility] # [id number]* (retail)       [address]       [place], [place]  [address]       Ph: [phone] or [phone]       Fax: [phone]   RxID:   [id number]

## 2010-02-09 NOTE — Miscellaneous (Signed)
Summary: Home Health Certification/Care Plan  Home Health Certification/Care Plan   Imported By: Roderic Ovens 01/24/2009 12:24:46  _____________________________________________________________________  External Attachment:    Type:   Image     Comment:   External Document

## 2010-02-09 NOTE — Miscellaneous (Signed)
  Clinical Lists Changes  Observations: Added new observation of ECHOINTERP:  - Left ventricle: There is moderate concentric LVH. In addition     there is upper septal thickening. There is no LVOT gradient. The     cavity size was normal. The estimated ejection fraction was 60%.     Wall motion was normal; there were no regional wall motion     abnormalities.   - Mitral valve: Mild regurgitation. (03/29/2009 13:37)      Echocardiogram  Procedure date:  03/29/2009  Findings:       - Left ventricle: There is moderate concentric LVH. In addition     there is upper septal thickening. There is no LVOT gradient. The     cavity size was normal. The estimated ejection fraction was 60%.     Wall motion was normal; there were no regional wall motion     abnormalities.   - Mitral valve: Mild regurgitation.

## 2010-02-09 NOTE — Miscellaneous (Signed)
Summary: [facility] Progress Note  [facility]   Imported By: Roderic Ovens 05/30/2009 15:36:59  _____________________________________________________________________  External Attachment:    Type:   Image     Comment:   External Document

## 2010-02-09 NOTE — Miscellaneous (Signed)
Summary: [facility] Note   [facility]   Imported By: Roderic Ovens 09/19/2009 11:50:11  _____________________________________________________________________  External Attachment:    Type:   Image     Comment:   External Document

## 2010-02-09 NOTE — Letter (Signed)
Summary: [facility] Office Note  [facility] Office Note   Imported By: Roderic Ovens 06/16/2009 15:52:01  _____________________________________________________________________  External Attachment:    Type:   Image     Comment:   External Document

## 2010-02-15 ENCOUNTER — Other Ambulatory Visit (HOSPITAL_COMMUNITY): Payer: Self-pay | Admitting: General Surgery

## 2010-02-15 ENCOUNTER — Encounter (HOSPITAL_COMMUNITY)
Admission: RE | Admit: 2010-02-15 | Discharge: 2010-02-15 | Disposition: A | Discharge status: Home / Self Care | Payer: Medicare Other | Source: Ambulatory Visit | Attending: General Surgery | Admitting: General Surgery

## 2010-02-15 ENCOUNTER — Ambulatory Visit (HOSPITAL_COMMUNITY)
Admission: RE | Admit: 2010-02-15 | Discharge: 2010-02-15 | Disposition: A | Discharge status: Home / Self Care | Payer: Medicare Other | Source: Ambulatory Visit | Attending: General Surgery | Admitting: General Surgery

## 2010-02-15 DIAGNOSIS — K4090 Unilateral inguinal hernia, without obstruction or gangrene, not specified as recurrent: Secondary | ICD-10-CM

## 2010-02-15 DIAGNOSIS — I10 Essential (primary) hypertension: Secondary | ICD-10-CM | POA: Insufficient documentation

## 2010-02-15 DIAGNOSIS — Z01818 Encounter for other preprocedural examination: Secondary | ICD-10-CM | POA: Insufficient documentation

## 2010-02-15 LAB — CBC
MCH: 28.1 pg (ref 26.0–34.0)
MCV: 81.1 fL (ref 78.0–100.0)
Platelets: 217 10*3/uL (ref 150–400)
RDW: 13.7 % (ref 11.5–15.5)
WBC: 6.1 10*3/uL (ref 4.0–10.5)

## 2010-02-15 LAB — BASIC METABOLIC PANEL
BUN: 10 mg/dL (ref 6–23)
CO2: 30 mEq/L (ref 19–32)
Calcium: 9.4 mg/dL (ref 8.4–10.5)
Chloride: 96 mEq/L (ref 96–112)
Creatinine, Ser: 1.11 mg/dL (ref 0.4–1.5)
GFR calc Af Amer: >60 mL/min (ref >60)

## 2010-02-15 LAB — SURGICAL PCR SCREEN
MRSA, PCR: NEGATIVE
Staphylococcus aureus: NEGATIVE

## 2010-02-17 ENCOUNTER — Ambulatory Visit (HOSPITAL_COMMUNITY)
Admission: RE | Admit: 2010-02-17 | Discharge: 2010-02-17 | Disposition: A | Discharge status: Home / Self Care | Payer: Medicare Other | Attending: General Surgery | Admitting: General Surgery

## 2010-02-17 DIAGNOSIS — J4489 Other specified chronic obstructive pulmonary disease: Secondary | ICD-10-CM | POA: Insufficient documentation

## 2010-02-17 DIAGNOSIS — J449 Chronic obstructive pulmonary disease, unspecified: Secondary | ICD-10-CM | POA: Insufficient documentation

## 2010-02-17 DIAGNOSIS — K4090 Unilateral inguinal hernia, without obstruction or gangrene, not specified as recurrent: Secondary | ICD-10-CM | POA: Insufficient documentation

## 2010-02-17 DIAGNOSIS — I10 Essential (primary) hypertension: Secondary | ICD-10-CM | POA: Insufficient documentation

## 2010-02-17 DIAGNOSIS — Z951 Presence of aortocoronary bypass graft: Secondary | ICD-10-CM | POA: Insufficient documentation

## 2010-02-17 DIAGNOSIS — E119 Type 2 diabetes mellitus without complications: Secondary | ICD-10-CM | POA: Insufficient documentation

## 2010-02-17 HISTORY — PX: INGUINAL HERNIA REPAIR: SUR1180

## 2010-02-20 NOTE — Op Note (Signed)
Joshua, Vincent                ACCOUNT NO.:  [id number]  MEDICAL RECORD NO.:  [id number]           PATIENT TYPE:  O  LOCATION:  [facility]                         FACILITY:  [facility]  PHYSICIAN:  Gabrielle Dare. Janee Morn, M.D.DATE OF BIRTH:  December 08, 1934  DATE OF PROCEDURE:  02/17/2010 DATE OF DISCHARGE:  02/17/2010                              OPERATIVE REPORT   PREOPERATIVE DIAGNOSIS:  Left inguinal hernia.  POSTOPERATIVE DIAGNOSIS:  Left inguinal hernia.  PROCEDURE:  Repair of left inguinal hernia with mesh.  SURGEON:  Gabrielle Dare. Janee Morn, MD  ANESTHESIA:  General endotracheal.  FINDINGS:  Large indirect left inguinal hernia that extended into the scrotum.  HISTORY OF PRESENT ILLNESS:  [name] is a 76 year old gentleman who I evaluated in the office for an increasingly symptomatic large left inguinal hernia.  It is extending down into his scrotum.  He presents for elective repair with mesh.  PROCEDURE IN DETAIL:  Informed consent was obtained.  The patient was identified in the preop holding area.  His site was marked.  He received intravenous antibiotics.  He was brought to the operating room.  General endotracheal anesthesia was administered by the anesthesia staff.  His lower abdomen and bilateral groins as well as his scrotum and penis all were prepped and draped in sterile fashion.  An extra towel was placed to keep the anus of the field but give [place] exposure to the scrotum to help with reduction of hernia.  Marcaine 0.25% with epinephrine was injected out towards the anterior-superior iliac spine on the left side and along the planned line of incision.  The hernia was not reducible initially.  Left inguinal incision was made.  Subcutaneous tissues were dissected down through Scarpa fascia revealing a very attenuated external oblique fascia.  This was divided out laterally and had already been obliterated medially due to the large hernia.  Once it was divided out laterally,  the inferior leaflet was dissected free off the hernia and down revealing the shelving edge of the inguinal ligament.  The superior leaflet was dissected off the hernia sac as well revealing the transversalis.  Next, the hernia itself was manipulated, so we were still unable to reduce it fully so the external oblique was opened a little bit further out laterally, freeing things up.  This facilitated reduction of the hernia up out of the scrotum and back into the abdomen. The hernia sac was then decompressed by the sac.  The cord structures and hernia sac were encircled with a Penrose drain.  The hernia sac was then gradually and carefully dissected free off the cord structures. The hernia sac was opened, it contained only some small amount of omentum residual and this was reduced back into the abdomen.  The hernia sac was freed up off the cord structures.  It was twisted high, ligated with a 2-0 Vicryl suture.  There was associated cord lipoma on the cord and it was removed and discarded.  The cord structures remained viable. The hernia repair was then accomplished with a polypropylene mesh, was cut into a keyhole fashion custom size.  After inspection of the floor  revealed no evidence of direct hernia, this mesh was then tacked medially to the tissues over the pubic tubercle with 2-0 Prolene and the stitch was continued in a running fashion inferiorly along the shelving edge of the inguinal ligament.  Next, the 2-0 Prolene was also used in an interrupted fashion to tack the mesh again to the tissues over the pubic tubercle and then superiorly out along the transversalis.  We continued tacking this down with the 2-0 Prolene.  Next, the two leaflets of the mesh were tacked together into the underlying musculature with 2-0 Prolene.  The ends of the leaflets of the mesh were left a bit long to tuck out laterally to extend out under the external oblique and these were tacked down again with  several interrupted 2-0 Prolene.  Next, the aperture in the mesh keyhole was adjusted slightly to submit tip of the fifth digit.  Cord structures remained viable and nonedematous.  Testicle was repositioned into anatomic place in the scrotum.  The area was copiously irrigated.  Some additional local anesthetic was injected.  Hemostasis was obtained.  The external oblique fascia as much as possible was closed with a running 2-0 Vicryl suture. Subcutaneous tissues were approximated with interrupted 3-0 Vicryl sutures and the skin was closed with running 4-0 Monocryl subcuticular stitch followed by Dermabond.  Sponge, needle, and instrument counts were again correct.  There was good hemostasis at the end of the case. There were no apparent complications.  The patient was taken to the recovery room in stable condition.     Gabrielle Dare Janee Morn, M.D.     BET/MEDQ  D:  02/17/2010  T:  02/17/2010  Job:  [id number]  cc:   L. Lupe [place], M.D. Valetta Fuller, M.D.  Electronically Signed by Violeta Gelinas M.D. on 02/20/2010 10:15:27 AM

## 2010-02-23 LAB — GLUCOSE, CAPILLARY: Glucose-Capillary: 144 mg/dL — ABNORMAL HIGH (ref 70–99)

## 2010-03-01 NOTE — Letter (Signed)
Summary: [facility] - Office Visit  [facility] - Office Visit   Imported By: Marylou Mccoy 02/21/2010 14:31:54  _____________________________________________________________________  External Attachment:    Type:   Image     Comment:   External Document

## 2010-03-26 LAB — COMPREHENSIVE METABOLIC PANEL
AST: 13 U/L (ref 0–37)
Albumin: 3.1 g/dL — ABNORMAL LOW (ref 3.5–5.2)
Alkaline Phosphatase: 86 U/L (ref 39–117)
CO2: 30 mEq/L (ref 19–32)
Chloride: 99 mEq/L (ref 96–112)
GFR calc Af Amer: 44 mL/min — ABNORMAL LOW (ref >60)
GFR calc non Af Amer: 36 mL/min — ABNORMAL LOW (ref >60)
Potassium: 4 mEq/L (ref 3.5–5.1)
Total Bilirubin: 1.1 mg/dL (ref 0.3–1.2)

## 2010-03-26 LAB — SURGICAL PCR SCREEN
MRSA, PCR: NEGATIVE
Staphylococcus aureus: POSITIVE — AB

## 2010-03-26 LAB — CBC
MCH: 29.6 pg (ref 26.0–34.0)
MCHC: 34.6 g/dL (ref 30.0–36.0)
Platelets: 180 10*3/uL (ref 150–400)
RDW: 20.7 % — ABNORMAL HIGH (ref 11.5–15.5)

## 2010-03-27 LAB — POCT I-STAT, CHEM 8
BUN: 10 mg/dL (ref 6–23)
Calcium, Ion: 1.18 mmol/L (ref 1.12–1.32)
Chloride: 109 mEq/L (ref 96–112)
Creatinine, Ser: 1 mg/dL (ref 0.4–1.5)
Glucose, Bld: 144 mg/dL — ABNORMAL HIGH (ref 70–99)
HCT: 31 % — ABNORMAL LOW (ref 39.0–52.0)
Hemoglobin: 10.5 g/dL — ABNORMAL LOW (ref 13.0–17.0)
Potassium: 3.8 mEq/L (ref 3.5–5.1)
Sodium: 139 mEq/L (ref 135–145)
TCO2: 24 mmol/L (ref 0–100)

## 2010-03-27 LAB — COMPREHENSIVE METABOLIC PANEL
ALT: 16 U/L (ref 0–53)
AST: 13 U/L (ref 0–37)
Albumin: 3.6 g/dL (ref 3.5–5.2)
Alkaline Phosphatase: 73 U/L (ref 39–117)
BUN: 11 mg/dL (ref 6–23)
CO2: 23 mEq/L (ref 19–32)
Calcium: 9 mg/dL (ref 8.4–10.5)
Chloride: 105 mEq/L (ref 96–112)
Creatinine, Ser: 0.96 mg/dL (ref 0.4–1.5)
GFR calc Af Amer: >60 mL/min (ref >60)
GFR calc non Af Amer: >60 mL/min (ref >60)
Glucose, Bld: 144 mg/dL — ABNORMAL HIGH (ref 70–99)
Potassium: 3.9 mEq/L (ref 3.5–5.1)
Sodium: 138 mEq/L (ref 135–145)
Total Bilirubin: 1 mg/dL (ref 0.3–1.2)
Total Protein: 6 g/dL (ref 6.0–8.3)

## 2010-03-27 LAB — GLUCOSE, CAPILLARY
Glucose-Capillary: 114 mg/dL — ABNORMAL HIGH (ref 70–99)
Glucose-Capillary: 117 mg/dL — ABNORMAL HIGH (ref 70–99)
Glucose-Capillary: 119 mg/dL — ABNORMAL HIGH (ref 70–99)
Glucose-Capillary: 121 mg/dL — ABNORMAL HIGH (ref 70–99)
Glucose-Capillary: 126 mg/dL — ABNORMAL HIGH (ref 70–99)
Glucose-Capillary: 136 mg/dL — ABNORMAL HIGH (ref 70–99)
Glucose-Capillary: 136 mg/dL — ABNORMAL HIGH (ref 70–99)
Glucose-Capillary: 142 mg/dL — ABNORMAL HIGH (ref 70–99)
Glucose-Capillary: 142 mg/dL — ABNORMAL HIGH (ref 70–99)
Glucose-Capillary: 92 mg/dL (ref 70–99)
Glucose-Capillary: 96 mg/dL (ref 70–99)

## 2010-03-27 LAB — POCT I-STAT 3, ART BLOOD GAS (G3+)
Acid-Base Excess: 3 mmol/L — ABNORMAL HIGH (ref 0.0–2.0)
Acid-Base Excess: 3 mmol/L — ABNORMAL HIGH (ref 0.0–2.0)
Acid-base deficit: 1 mmol/L (ref 0.0–2.0)
Acid-base deficit: 1 mmol/L (ref 0.0–2.0)
Acid-base deficit: 1 mmol/L (ref 0.0–2.0)
Acid-base deficit: 2 mmol/L (ref 0.0–2.0)
Acid-base deficit: 2 mmol/L (ref 0.0–2.0)
Bicarbonate: 23.3 mEq/L (ref 20.0–24.0)
Bicarbonate: 23.4 mEq/L (ref 20.0–24.0)
Bicarbonate: 23.4 mEq/L (ref 20.0–24.0)
Bicarbonate: 23.8 mEq/L (ref 20.0–24.0)
Bicarbonate: 24.2 mEq/L — ABNORMAL HIGH (ref 20.0–24.0)
Bicarbonate: 26.6 mEq/L — ABNORMAL HIGH (ref 20.0–24.0)
Bicarbonate: 26.9 mEq/L — ABNORMAL HIGH (ref 20.0–24.0)
O2 Saturation: 100 %
O2 Saturation: 100 %
O2 Saturation: 91 %
O2 Saturation: 92 %
O2 Saturation: 92 %
O2 Saturation: 94 %
O2 Saturation: 98 %
Patient temperature: 36
Patient temperature: 36.3
Patient temperature: 36.5
Patient temperature: 36.5
Patient temperature: 36.6
TCO2: 24 mmol/L (ref 0–100)
TCO2: 25 mmol/L (ref 0–100)
TCO2: 25 mmol/L (ref 0–100)
TCO2: 25 mmol/L (ref 0–100)
TCO2: 25 mmol/L (ref 0–100)
TCO2: 28 mmol/L (ref 0–100)
TCO2: 28 mmol/L (ref 0–100)
pCO2 arterial: 36.2 mmHg (ref 35.0–45.0)
pCO2 arterial: 36.6 mmHg (ref 35.0–45.0)
pCO2 arterial: 37.3 mmHg (ref 35.0–45.0)
pCO2 arterial: 38.2 mmHg (ref 35.0–45.0)
pCO2 arterial: 38.2 mmHg (ref 35.0–45.0)
pCO2 arterial: 38.4 mmHg (ref 35.0–45.0)
pCO2 arterial: 39 mmHg (ref 35.0–45.0)
pH, Arterial: 7.389 (ref 7.350–7.450)
pH, Arterial: 7.392 (ref 7.350–7.450)
pH, Arterial: 7.392 (ref 7.350–7.450)
pH, Arterial: 7.407 (ref 7.350–7.450)
pH, Arterial: 7.414 (ref 7.350–7.450)
pH, Arterial: 7.461 — ABNORMAL HIGH (ref 7.350–7.450)
pH, Arterial: 7.474 — ABNORMAL HIGH (ref 7.350–7.450)
pO2, Arterial: 294 mmHg — ABNORMAL HIGH (ref 80.0–100.0)
pO2, Arterial: 365 mmHg — ABNORMAL HIGH (ref 80.0–100.0)
pO2, Arterial: 60 mmHg — ABNORMAL LOW (ref 80.0–100.0)
pO2, Arterial: 60 mmHg — ABNORMAL LOW (ref 80.0–100.0)
pO2, Arterial: 61 mmHg — ABNORMAL LOW (ref 80.0–100.0)
pO2, Arterial: 67 mmHg — ABNORMAL LOW (ref 80.0–100.0)
pO2, Arterial: 96 mmHg (ref 80.0–100.0)

## 2010-03-27 LAB — POCT I-STAT 4, (NA,K, GLUC, HGB,HCT)
Glucose, Bld: 101 mg/dL — ABNORMAL HIGH (ref 70–99)
Glucose, Bld: 108 mg/dL — ABNORMAL HIGH (ref 70–99)
Glucose, Bld: 121 mg/dL — ABNORMAL HIGH (ref 70–99)
Glucose, Bld: 81 mg/dL (ref 70–99)
Glucose, Bld: 82 mg/dL (ref 70–99)
Glucose, Bld: 89 mg/dL (ref 70–99)
HCT: 25 % — ABNORMAL LOW (ref 39.0–52.0)
HCT: 26 % — ABNORMAL LOW (ref 39.0–52.0)
HCT: 26 % — ABNORMAL LOW (ref 39.0–52.0)
HCT: 28 % — ABNORMAL LOW (ref 39.0–52.0)
HCT: 32 % — ABNORMAL LOW (ref 39.0–52.0)
HCT: 36 % — ABNORMAL LOW (ref 39.0–52.0)
Hemoglobin: 10.9 g/dL — ABNORMAL LOW (ref 13.0–17.0)
Hemoglobin: 12.2 g/dL — ABNORMAL LOW (ref 13.0–17.0)
Hemoglobin: 8.5 g/dL — ABNORMAL LOW (ref 13.0–17.0)
Hemoglobin: 8.8 g/dL — ABNORMAL LOW (ref 13.0–17.0)
Hemoglobin: 8.8 g/dL — ABNORMAL LOW (ref 13.0–17.0)
Hemoglobin: 9.5 g/dL — ABNORMAL LOW (ref 13.0–17.0)
Potassium: 2.8 mEq/L — ABNORMAL LOW (ref 3.5–5.1)
Potassium: 2.8 mEq/L — ABNORMAL LOW (ref 3.5–5.1)
Potassium: 2.9 mEq/L — ABNORMAL LOW (ref 3.5–5.1)
Potassium: 2.9 mEq/L — ABNORMAL LOW (ref 3.5–5.1)
Potassium: 3.3 mEq/L — ABNORMAL LOW (ref 3.5–5.1)
Potassium: 3.4 mEq/L — ABNORMAL LOW (ref 3.5–5.1)
Sodium: 136 mEq/L (ref 135–145)
Sodium: 139 mEq/L (ref 135–145)
Sodium: 140 mEq/L (ref 135–145)
Sodium: 141 mEq/L (ref 135–145)
Sodium: 142 mEq/L (ref 135–145)
Sodium: 142 mEq/L (ref 135–145)

## 2010-03-27 LAB — CBC
HCT: 29.8 % — ABNORMAL LOW (ref 39.0–52.0)
HCT: 33.9 % — ABNORMAL LOW (ref 39.0–52.0)
HCT: 37 % — ABNORMAL LOW (ref 39.0–52.0)
Hemoglobin: 11.5 g/dL — ABNORMAL LOW (ref 13.0–17.0)
Hemoglobin: 13 g/dL (ref 13.0–17.0)
Hemoglobin: 9.9 g/dL — ABNORMAL LOW (ref 13.0–17.0)
MCHC: 33.2 g/dL (ref 30.0–36.0)
MCHC: 33.8 g/dL (ref 30.0–36.0)
MCHC: 35.1 g/dL (ref 30.0–36.0)
MCV: 89.9 fL (ref 78.0–100.0)
MCV: 90.5 fL (ref 78.0–100.0)
MCV: 91.9 fL (ref 78.0–100.0)
Platelets: 121 10*3/uL — ABNORMAL LOW (ref 150–400)
Platelets: 155 10*3/uL (ref 150–400)
Platelets: 170 10*3/uL (ref 150–400)
RBC: 3.3 MIL/uL — ABNORMAL LOW (ref 4.22–5.81)
RBC: 3.69 MIL/uL — ABNORMAL LOW (ref 4.22–5.81)
RBC: 4.12 MIL/uL — ABNORMAL LOW (ref 4.22–5.81)
RDW: 13.7 % (ref 11.5–15.5)
RDW: 13.7 % (ref 11.5–15.5)
RDW: 14 % (ref 11.5–15.5)
WBC: 11.5 10*3/uL — ABNORMAL HIGH (ref 4.0–10.5)
WBC: 5.5 10*3/uL (ref 4.0–10.5)
WBC: 8 10*3/uL (ref 4.0–10.5)

## 2010-03-27 LAB — TYPE AND SCREEN
ABO/RH(D): O POS
Antibody Screen: NEGATIVE

## 2010-03-27 LAB — HEMOGLOBIN AND HEMATOCRIT, BLOOD
HCT: 26.6 % — ABNORMAL LOW (ref 39.0–52.0)
Hemoglobin: 9.2 g/dL — ABNORMAL LOW (ref 13.0–17.0)

## 2010-03-27 LAB — HEMOGLOBIN A1C
Hgb A1c MFr Bld: 8 % — ABNORMAL HIGH (ref 4.6–6.1)
Mean Plasma Glucose: 183 mg/dL

## 2010-03-27 LAB — PROTIME-INR
INR: 1.02 (ref 0.00–1.49)
INR: 1.48 (ref 0.00–1.49)
Prothrombin Time: 13.3 seconds (ref 11.6–15.2)
Prothrombin Time: 17.8 seconds — ABNORMAL HIGH (ref 11.6–15.2)

## 2010-03-27 LAB — PREPARE PLATELETS

## 2010-03-27 LAB — BLOOD GAS, ARTERIAL
Acid-Base Excess: 1 mmol/L (ref 0.0–2.0)
Bicarbonate: 25 mEq/L — ABNORMAL HIGH (ref 20.0–24.0)
Drawn by: 206361
FIO2: 0.21 %
O2 Saturation: 98.4 %
Patient temperature: 98.6
TCO2: 26.2 mmol/L (ref 0–100)
pCO2 arterial: 39.1 mmHg (ref 35.0–45.0)
pH, Arterial: 7.421 (ref 7.350–7.450)
pO2, Arterial: 104 mmHg — ABNORMAL HIGH (ref 80.0–100.0)

## 2010-03-27 LAB — URINALYSIS, ROUTINE W REFLEX MICROSCOPIC
Bilirubin Urine: NEGATIVE
Glucose, UA: NEGATIVE mg/dL
Hgb urine dipstick: NEGATIVE
Ketones, ur: NEGATIVE mg/dL
Leukocytes, UA: NEGATIVE
Nitrite: NEGATIVE
Protein, ur: >300 mg/dL — AB
Specific Gravity, Urine: 1.02 (ref 1.005–1.030)
Urobilinogen, UA: 1 mg/dL (ref 0.0–1.0)
pH: 5.5 (ref 5.0–8.0)

## 2010-03-27 LAB — PLATELET COUNT: Platelets: 102 10*3/uL — ABNORMAL LOW (ref 150–400)

## 2010-03-27 LAB — APTT
aPTT: 32 seconds (ref 24–37)
aPTT: 39 seconds — ABNORMAL HIGH (ref 24–37)

## 2010-03-27 LAB — MAGNESIUM: Magnesium: 2.4 mg/dL (ref 1.5–2.5)

## 2010-03-27 LAB — URINE MICROSCOPIC-ADD ON

## 2010-03-27 LAB — CREATININE, SERUM
Creatinine, Ser: 0.84 mg/dL (ref 0.4–1.5)
GFR calc Af Amer: >60 mL/min (ref >60)
GFR calc non Af Amer: >60 mL/min (ref >60)

## 2010-03-27 LAB — ABO/RH: ABO/RH(D): O POS

## 2010-03-27 LAB — POCT I-STAT GLUCOSE
Glucose, Bld: 92 mg/dL (ref 70–99)
Operator id: 156951

## 2010-03-27 LAB — MRSA PCR SCREENING: MRSA by PCR: NEGATIVE

## 2010-03-28 LAB — GLUCOSE, CAPILLARY: Glucose-Capillary: 228 mg/dL — ABNORMAL HIGH (ref 70–99)

## 2010-03-29 LAB — POCT I-STAT, CHEM 8
BUN: 26 mg/dL — ABNORMAL HIGH (ref 6–23)
Chloride: 105 mEq/L (ref 96–112)
Creatinine, Ser: 1.5 mg/dL (ref 0.4–1.5)
Glucose, Bld: 239 mg/dL — ABNORMAL HIGH (ref 70–99)
Hemoglobin: 12.2 g/dL — ABNORMAL LOW (ref 13.0–17.0)
Potassium: 3.6 mEq/L (ref 3.5–5.1)
Sodium: 137 mEq/L (ref 135–145)

## 2010-03-29 LAB — URINALYSIS, ROUTINE W REFLEX MICROSCOPIC
Hgb urine dipstick: NEGATIVE
Ketones, ur: NEGATIVE mg/dL
Nitrite: NEGATIVE
Protein, ur: 30 mg/dL — AB
Urobilinogen, UA: 1 mg/dL (ref 0.0–1.0)

## 2010-03-29 LAB — CBC
HCT: 36.2 % — ABNORMAL LOW (ref 39.0–52.0)
Hemoglobin: 11.9 g/dL — ABNORMAL LOW (ref 13.0–17.0)
MCHC: 33 g/dL (ref 30.0–36.0)
Platelets: 208 10*3/uL (ref 150–400)
RDW: 15.7 % — ABNORMAL HIGH (ref 11.5–15.5)

## 2010-03-29 LAB — POCT CARDIAC MARKERS
CKMB, poc: <1 ng/mL — ABNORMAL LOW (ref 1.0–8.0)
Myoglobin, poc: 148 ng/mL (ref 12–200)
Troponin i, poc: <0.05 ng/mL (ref 0.00–0.09)

## 2010-03-30 LAB — GLUCOSE, CAPILLARY
Glucose-Capillary: 108 mg/dL — ABNORMAL HIGH (ref 70–99)
Glucose-Capillary: 111 mg/dL — ABNORMAL HIGH (ref 70–99)
Glucose-Capillary: 121 mg/dL — ABNORMAL HIGH (ref 70–99)
Glucose-Capillary: 131 mg/dL — ABNORMAL HIGH (ref 70–99)
Glucose-Capillary: 131 mg/dL — ABNORMAL HIGH (ref 70–99)
Glucose-Capillary: 133 mg/dL — ABNORMAL HIGH (ref 70–99)
Glucose-Capillary: 134 mg/dL — ABNORMAL HIGH (ref 70–99)
Glucose-Capillary: 136 mg/dL — ABNORMAL HIGH (ref 70–99)
Glucose-Capillary: 137 mg/dL — ABNORMAL HIGH (ref 70–99)
Glucose-Capillary: 139 mg/dL — ABNORMAL HIGH (ref 70–99)
Glucose-Capillary: 147 mg/dL — ABNORMAL HIGH (ref 70–99)
Glucose-Capillary: 148 mg/dL — ABNORMAL HIGH (ref 70–99)
Glucose-Capillary: 149 mg/dL — ABNORMAL HIGH (ref 70–99)
Glucose-Capillary: 175 mg/dL — ABNORMAL HIGH (ref 70–99)
Glucose-Capillary: 181 mg/dL — ABNORMAL HIGH (ref 70–99)
Glucose-Capillary: 196 mg/dL — ABNORMAL HIGH (ref 70–99)
Glucose-Capillary: 213 mg/dL — ABNORMAL HIGH (ref 70–99)
Glucose-Capillary: 243 mg/dL — ABNORMAL HIGH (ref 70–99)
Glucose-Capillary: 77 mg/dL (ref 70–99)
Glucose-Capillary: 86 mg/dL (ref 70–99)
Glucose-Capillary: 98 mg/dL (ref 70–99)
Glucose-Capillary: 99 mg/dL (ref 70–99)

## 2010-03-30 LAB — BASIC METABOLIC PANEL
BUN: 11 mg/dL (ref 6–23)
BUN: 13 mg/dL (ref 6–23)
BUN: 14 mg/dL (ref 6–23)
CO2: 23 mEq/L (ref 19–32)
CO2: 27 mEq/L (ref 19–32)
CO2: 30 mEq/L (ref 19–32)
Calcium: 8.1 mg/dL — ABNORMAL LOW (ref 8.4–10.5)
Calcium: 8.5 mg/dL (ref 8.4–10.5)
Calcium: 8.8 mg/dL (ref 8.4–10.5)
Calcium: 9.1 mg/dL (ref 8.4–10.5)
Chloride: 105 mEq/L (ref 96–112)
Chloride: 105 mEq/L (ref 96–112)
Chloride: 107 mEq/L (ref 96–112)
Creatinine, Ser: 0.94 mg/dL (ref 0.4–1.5)
Creatinine, Ser: 0.97 mg/dL (ref 0.4–1.5)
Creatinine, Ser: 1.02 mg/dL (ref 0.4–1.5)
Creatinine, Ser: 1.05 mg/dL (ref 0.4–1.5)
GFR calc Af Amer: >60 mL/min (ref >60)
GFR calc Af Amer: >60 mL/min (ref >60)
GFR calc Af Amer: >60 mL/min (ref >60)
GFR calc Af Amer: >60 mL/min (ref >60)
GFR calc non Af Amer: >60 mL/min (ref >60)
GFR calc non Af Amer: >60 mL/min (ref >60)
GFR calc non Af Amer: >60 mL/min (ref >60)
GFR calc non Af Amer: >60 mL/min (ref >60)
Glucose, Bld: 141 mg/dL — ABNORMAL HIGH (ref 70–99)
Glucose, Bld: 141 mg/dL — ABNORMAL HIGH (ref 70–99)
Glucose, Bld: 88 mg/dL (ref 70–99)
Glucose, Bld: 98 mg/dL (ref 70–99)
Potassium: 3.5 mEq/L (ref 3.5–5.1)
Potassium: 3.9 mEq/L (ref 3.5–5.1)
Potassium: 4.1 mEq/L (ref 3.5–5.1)
Sodium: 137 mEq/L (ref 135–145)
Sodium: 138 mEq/L (ref 135–145)
Sodium: 142 mEq/L (ref 135–145)
Sodium: 143 mEq/L (ref 135–145)

## 2010-03-30 LAB — CBC
HCT: 29.9 % — ABNORMAL LOW (ref 39.0–52.0)
HCT: 31.3 % — ABNORMAL LOW (ref 39.0–52.0)
HCT: 31.4 % — ABNORMAL LOW (ref 39.0–52.0)
Hemoglobin: 10.7 g/dL — ABNORMAL LOW (ref 13.0–17.0)
Hemoglobin: 10.7 g/dL — ABNORMAL LOW (ref 13.0–17.0)
Hemoglobin: 9.9 g/dL — ABNORMAL LOW (ref 13.0–17.0)
MCHC: 33.1 g/dL (ref 30.0–36.0)
MCHC: 34 g/dL (ref 30.0–36.0)
MCHC: 34.2 g/dL (ref 30.0–36.0)
MCV: 90.1 fL (ref 78.0–100.0)
MCV: 91.9 fL (ref 78.0–100.0)
MCV: 92 fL (ref 78.0–100.0)
Platelets: 135 K/uL — ABNORMAL LOW (ref 150–400)
Platelets: 142 K/uL — ABNORMAL LOW (ref 150–400)
Platelets: 162 10*3/uL (ref 150–400)
RBC: 3.32 MIL/uL — ABNORMAL LOW (ref 4.22–5.81)
RBC: 3.4 MIL/uL — ABNORMAL LOW (ref 4.22–5.81)
RBC: 3.42 MIL/uL — ABNORMAL LOW (ref 4.22–5.81)
RDW: 13.9 % (ref 11.5–15.5)
RDW: 14.2 % (ref 11.5–15.5)
RDW: 14.3 % (ref 11.5–15.5)
WBC: 11.8 K/uL — ABNORMAL HIGH (ref 4.0–10.5)
WBC: 9.4 10*3/uL (ref 4.0–10.5)
WBC: 9.5 K/uL (ref 4.0–10.5)

## 2010-03-30 LAB — URINALYSIS, MICROSCOPIC ONLY
Bilirubin Urine: NEGATIVE
Glucose, UA: NEGATIVE mg/dL
Ketones, ur: NEGATIVE mg/dL
Nitrite: NEGATIVE
Protein, ur: 100 mg/dL — AB
Specific Gravity, Urine: 1.023 (ref 1.005–1.030)
Urobilinogen, UA: 0.2 mg/dL (ref 0.0–1.0)
pH: 5.5 (ref 5.0–8.0)

## 2010-03-30 LAB — BASIC METABOLIC PANEL WITH GFR
BUN: 15 mg/dL (ref 6–23)
CO2: 28 meq/L (ref 19–32)
Calcium: 8.6 mg/dL (ref 8.4–10.5)
Chloride: 104 meq/L (ref 96–112)
Creatinine, Ser: 1.02 mg/dL (ref 0.4–1.5)
GFR calc non Af Amer: >60 mL/min
Glucose, Bld: 114 mg/dL — ABNORMAL HIGH (ref 70–99)
Potassium: 3.9 meq/L (ref 3.5–5.1)
Sodium: 138 meq/L (ref 135–145)

## 2010-03-30 LAB — TSH: TSH: 2.706 u[IU]/mL (ref 0.350–4.500)

## 2010-03-30 LAB — MAGNESIUM
Magnesium: 2.2 mg/dL (ref 1.5–2.5)
Magnesium: 2.4 mg/dL (ref 1.5–2.5)

## 2010-04-03 ENCOUNTER — Encounter: Payer: Self-pay | Admitting: *Deleted

## 2010-04-03 LAB — COMPREHENSIVE METABOLIC PANEL
ALT: 12 U/L (ref 0–53)
ALT: 15 U/L (ref 0–53)
ALT: 18 U/L (ref 0–53)
AST: 13 U/L (ref 0–37)
Albumin: 2.3 g/dL — ABNORMAL LOW (ref 3.5–5.2)
Alkaline Phosphatase: 76 U/L (ref 39–117)
BUN: 12 mg/dL (ref 6–23)
BUN: 14 mg/dL (ref 6–23)
CO2: 27 mEq/L (ref 19–32)
CO2: 30 mEq/L (ref 19–32)
Calcium: 8.7 mg/dL (ref 8.4–10.5)
Calcium: 9.4 mg/dL (ref 8.4–10.5)
Chloride: 103 mEq/L (ref 96–112)
GFR calc Af Amer: >60 mL/min (ref >60)
GFR calc non Af Amer: >60 mL/min (ref >60)
GFR calc non Af Amer: >60 mL/min (ref >60)
Glucose, Bld: 128 mg/dL — ABNORMAL HIGH (ref 70–99)
Glucose, Bld: 92 mg/dL (ref 70–99)
Potassium: 3.8 mEq/L (ref 3.5–5.1)
Sodium: 137 mEq/L (ref 135–145)
Sodium: 139 mEq/L (ref 135–145)
Sodium: 140 mEq/L (ref 135–145)
Total Bilirubin: 0.4 mg/dL (ref 0.3–1.2)
Total Protein: 6.8 g/dL (ref 6.0–8.3)

## 2010-04-03 LAB — BASIC METABOLIC PANEL
BUN: 15 mg/dL (ref 6–23)
BUN: 19 mg/dL (ref 6–23)
CO2: 31 mEq/L (ref 19–32)
Chloride: 102 mEq/L (ref 96–112)
Chloride: 103 mEq/L (ref 96–112)
Creatinine, Ser: 1.2 mg/dL (ref 0.4–1.5)
Glucose, Bld: 133 mg/dL — ABNORMAL HIGH (ref 70–99)
Potassium: 3.2 mEq/L — ABNORMAL LOW (ref 3.5–5.1)
Potassium: 3.7 mEq/L (ref 3.5–5.1)

## 2010-04-03 LAB — GLUCOSE, CAPILLARY
Glucose-Capillary: 100 mg/dL — ABNORMAL HIGH (ref 70–99)
Glucose-Capillary: 112 mg/dL — ABNORMAL HIGH (ref 70–99)
Glucose-Capillary: 128 mg/dL — ABNORMAL HIGH (ref 70–99)
Glucose-Capillary: 189 mg/dL — ABNORMAL HIGH (ref 70–99)
Glucose-Capillary: 204 mg/dL — ABNORMAL HIGH (ref 70–99)
Glucose-Capillary: 276 mg/dL — ABNORMAL HIGH (ref 70–99)
Glucose-Capillary: 49 mg/dL — ABNORMAL LOW (ref 70–99)

## 2010-04-03 LAB — CBC
HCT: 31.1 % — ABNORMAL LOW (ref 39.0–52.0)
HCT: 31.9 % — ABNORMAL LOW (ref 39.0–52.0)
HCT: 34.2 % — ABNORMAL LOW (ref 39.0–52.0)
Hemoglobin: 10.5 g/dL — ABNORMAL LOW (ref 13.0–17.0)
MCHC: 33.2 g/dL (ref 30.0–36.0)
MCHC: 33.9 g/dL (ref 30.0–36.0)
MCV: 82.8 fL (ref 78.0–100.0)
MCV: 84 fL (ref 78.0–100.0)
Platelets: 217 10*3/uL (ref 150–400)
Platelets: 260 10*3/uL (ref 150–400)
RBC: 3.72 MIL/uL — ABNORMAL LOW (ref 4.22–5.81)
RBC: 4.6 MIL/uL (ref 4.22–5.81)
RDW: 15.2 % (ref 11.5–15.5)
RDW: 15.5 % (ref 11.5–15.5)
WBC: 6.5 10*3/uL (ref 4.0–10.5)
WBC: 7.7 10*3/uL (ref 4.0–10.5)

## 2010-04-03 LAB — DIFFERENTIAL
Lymphocytes Relative: 20 % (ref 12–46)
Lymphs Abs: 1.6 10*3/uL (ref 0.7–4.0)
Monocytes Relative: 11 % (ref 3–12)
Neutro Abs: 5 10*3/uL (ref 1.7–7.7)
Neutrophils Relative %: 65 % (ref 43–77)

## 2010-04-03 LAB — CK TOTAL AND CKMB (NOT AT ARMC)
Relative Index: INVALID (ref 0.0–2.5)
Total CK: 33 U/L (ref 7–232)

## 2010-04-03 LAB — TROPONIN I: Troponin I: 0.14 ng/mL — ABNORMAL HIGH (ref 0.00–0.06)

## 2010-04-03 LAB — PROTIME-INR
INR: 1.02 (ref 0.00–1.49)
Prothrombin Time: 13.3 seconds (ref 11.6–15.2)

## 2010-04-03 LAB — APTT: aPTT: 35 seconds (ref 24–37)

## 2010-04-03 LAB — CARDIAC PANEL(CRET KIN+CKTOT+MB+TROPI)
CK, MB: 1 ng/mL (ref 0.3–4.0)
Relative Index: INVALID (ref 0.0–2.5)

## 2010-04-03 LAB — TSH: TSH: 2.793 u[IU]/mL (ref 0.350–4.500)

## 2010-04-11 LAB — CBC
HCT: 37 % — ABNORMAL LOW (ref 39.0–52.0)
Hemoglobin: 12.3 g/dL — ABNORMAL LOW (ref 13.0–17.0)
Hemoglobin: 12.9 g/dL — ABNORMAL LOW (ref 13.0–17.0)
MCHC: 33.8 g/dL (ref 30.0–36.0)
MCHC: 34.2 g/dL (ref 30.0–36.0)
MCHC: 34.5 g/dL (ref 30.0–36.0)
MCHC: 34.7 g/dL (ref 30.0–36.0)
MCV: 91.4 fL (ref 78.0–100.0)
MCV: 91.5 fL (ref 78.0–100.0)
Platelets: 154 10*3/uL (ref 150–400)
Platelets: 163 10*3/uL (ref 150–400)
Platelets: 172 10*3/uL (ref 150–400)
Platelets: 185 10*3/uL (ref 150–400)
RBC: 4.02 MIL/uL — ABNORMAL LOW (ref 4.22–5.81)
RDW: 13.2 % (ref 11.5–15.5)
RDW: 13.5 % (ref 11.5–15.5)
RDW: 13.6 % (ref 11.5–15.5)
RDW: 13.6 % (ref 11.5–15.5)
RDW: 13.8 % (ref 11.5–15.5)
WBC: 7.7 10*3/uL (ref 4.0–10.5)

## 2010-04-11 LAB — BASIC METABOLIC PANEL
BUN: 17 mg/dL (ref 6–23)
CO2: 25 mEq/L (ref 19–32)
CO2: 25 mEq/L (ref 19–32)
CO2: 28 mEq/L (ref 19–32)
CO2: 29 mEq/L (ref 19–32)
Calcium: 10.1 mg/dL (ref 8.4–10.5)
Calcium: 9 mg/dL (ref 8.4–10.5)
Calcium: 9.2 mg/dL (ref 8.4–10.5)
Calcium: 9.5 mg/dL (ref 8.4–10.5)
Chloride: 100 mEq/L (ref 96–112)
Chloride: 102 mEq/L (ref 96–112)
Creatinine, Ser: 1.08 mg/dL (ref 0.4–1.5)
Creatinine, Ser: 1.08 mg/dL (ref 0.4–1.5)
Creatinine, Ser: 1.21 mg/dL (ref 0.4–1.5)
Creatinine, Ser: 1.3 mg/dL (ref 0.4–1.5)
GFR calc Af Amer: >60 mL/min (ref >60)
GFR calc Af Amer: >60 mL/min (ref >60)
GFR calc non Af Amer: 54 mL/min — ABNORMAL LOW (ref >60)
GFR calc non Af Amer: >60 mL/min (ref >60)
Glucose, Bld: 161 mg/dL — ABNORMAL HIGH (ref 70–99)
Glucose, Bld: 196 mg/dL — ABNORMAL HIGH (ref 70–99)
Glucose, Bld: 231 mg/dL — ABNORMAL HIGH (ref 70–99)
Glucose, Bld: 246 mg/dL — ABNORMAL HIGH (ref 70–99)
Potassium: 3.6 mEq/L (ref 3.5–5.1)
Potassium: 4 mEq/L (ref 3.5–5.1)
Sodium: 133 mEq/L — ABNORMAL LOW (ref 135–145)
Sodium: 136 mEq/L (ref 135–145)
Sodium: 137 mEq/L (ref 135–145)
Sodium: 140 mEq/L (ref 135–145)

## 2010-04-11 LAB — GLUCOSE, CAPILLARY
Glucose-Capillary: 156 mg/dL — ABNORMAL HIGH (ref 70–99)
Glucose-Capillary: 162 mg/dL — ABNORMAL HIGH (ref 70–99)
Glucose-Capillary: 167 mg/dL — ABNORMAL HIGH (ref 70–99)
Glucose-Capillary: 171 mg/dL — ABNORMAL HIGH (ref 70–99)
Glucose-Capillary: 179 mg/dL — ABNORMAL HIGH (ref 70–99)
Glucose-Capillary: 181 mg/dL — ABNORMAL HIGH (ref 70–99)
Glucose-Capillary: 183 mg/dL — ABNORMAL HIGH (ref 70–99)
Glucose-Capillary: 194 mg/dL — ABNORMAL HIGH (ref 70–99)
Glucose-Capillary: 205 mg/dL — ABNORMAL HIGH (ref 70–99)
Glucose-Capillary: 218 mg/dL — ABNORMAL HIGH (ref 70–99)
Glucose-Capillary: 239 mg/dL — ABNORMAL HIGH (ref 70–99)
Glucose-Capillary: 256 mg/dL — ABNORMAL HIGH (ref 70–99)
Glucose-Capillary: 300 mg/dL — ABNORMAL HIGH (ref 70–99)
Glucose-Capillary: 316 mg/dL — ABNORMAL HIGH (ref 70–99)

## 2010-04-11 LAB — HEMOGLOBINOPATHY EVALUATION
Hemoglobin Other: 0 % (ref 0.0–0.0)
Hgb A: 97.5 % (ref 96.8–97.8)

## 2010-04-11 LAB — CARDIAC PANEL(CRET KIN+CKTOT+MB+TROPI)
CK, MB: 4.4 ng/mL — ABNORMAL HIGH (ref 0.3–4.0)
Relative Index: INVALID (ref 0.0–2.5)
Relative Index: INVALID (ref 0.0–2.5)
Total CK: 69 U/L (ref 7–232)
Total CK: 95 U/L (ref 7–232)
Troponin I: 2.21 ng/mL (ref 0.00–0.06)

## 2010-04-11 LAB — LIPID PANEL
Cholesterol: 143 mg/dL (ref 0–200)
HDL: 46 mg/dL (ref >39)
Total CHOL/HDL Ratio: 3.1 RATIO
VLDL: 21 mg/dL (ref 0–40)

## 2010-04-11 LAB — DIFFERENTIAL
Basophils Absolute: 0 10*3/uL (ref 0.0–0.1)
Basophils Relative: 1 % (ref 0–1)
Eosinophils Absolute: 0.2 10*3/uL (ref 0.0–0.7)
Eosinophils Relative: 2 % (ref 0–5)
Monocytes Absolute: 0.5 10*3/uL (ref 0.1–1.0)

## 2010-04-11 LAB — PROTIME-INR: INR: 0.96 (ref 0.00–1.49)

## 2010-04-11 LAB — CK TOTAL AND CKMB (NOT AT ARMC)
CK, MB: 5.7 ng/mL — ABNORMAL HIGH (ref 0.3–4.0)
Relative Index: 5.1 — ABNORMAL HIGH (ref 0.0–2.5)
Total CK: 112 U/L (ref 7–232)

## 2010-04-11 LAB — HEPARIN LEVEL (UNFRACTIONATED): Heparin Unfractionated: 0.22 IU/mL — ABNORMAL LOW (ref 0.30–0.70)

## 2010-04-11 LAB — TROPONIN I: Troponin I: 1.55 ng/mL (ref 0.00–0.06)

## 2010-04-11 LAB — POCT CARDIAC MARKERS: CKMB, poc: 1.1 ng/mL (ref 1.0–8.0)

## 2010-04-11 LAB — APTT: aPTT: 31 seconds (ref 24–37)

## 2010-04-17 ENCOUNTER — Ambulatory Visit (INDEPENDENT_AMBULATORY_CARE_PROVIDER_SITE_OTHER): Payer: Medicare Other | Admitting: Cardiology

## 2010-04-17 ENCOUNTER — Encounter: Payer: Self-pay | Admitting: Cardiology

## 2010-04-17 VITALS — BP 96/60 | HR 70 | Ht 71.0 in | Wt 206.8 lb

## 2010-04-17 DIAGNOSIS — E1149 Type 2 diabetes mellitus with other diabetic neurological complication: Secondary | ICD-10-CM

## 2010-04-17 DIAGNOSIS — I509 Heart failure, unspecified: Secondary | ICD-10-CM

## 2010-04-17 DIAGNOSIS — I5032 Chronic diastolic (congestive) heart failure: Secondary | ICD-10-CM

## 2010-04-17 DIAGNOSIS — R0602 Shortness of breath: Secondary | ICD-10-CM

## 2010-04-17 DIAGNOSIS — I25810 Atherosclerosis of coronary artery bypass graft(s) without angina pectoris: Secondary | ICD-10-CM

## 2010-04-17 DIAGNOSIS — E785 Hyperlipidemia, unspecified: Secondary | ICD-10-CM

## 2010-04-17 MED ORDER — POTASSIUM CHLORIDE CRYS ER 20 MEQ PO TBCR: EXTENDED_RELEASE_TABLET | ORAL | Status: DC

## 2010-04-17 MED ORDER — FUROSEMIDE 40 MG PO TABS: ORAL_TABLET | ORAL | Status: DC

## 2010-04-17 NOTE — Patient Instructions (Signed)
Increase Lasix(furosemide) to 40mg  twice a day.  Increase KCL(potassium)  To in the morning and 20 mEq in the evening. This will be two 20 mEq tablets in the morning and one 20 mEq tablet in the evening.  Lab in 2 weeks--bmp/bnp 428.32  414.05  Appointment with Dr Shirlee Latch in 2 months(June 2012).

## 2010-04-17 NOTE — Assessment & Plan Note (Signed)
Stable with no ischemic symptoms.  Continue ASA, Coreg, ACEI, and statin.

## 2010-04-17 NOTE — Progress Notes (Signed)
PCP: Dr. Clovis Riley  76 yo with history of CAD s/p NSTEMI accompanied by pulmonary edema and peri-catheterization CVA, now s/p CABG presents for followup.  He has also had significant diastolic CHF.  He had an inguinal hernia repair in 2/12 without complication.  He has gained 14 lbs since I last saw him.  His diet is poor (donuts/sweets, fast food on occasion, etc).  He does try to watch salt intake.  He has noted increased exertional dyspnea recently and has to stop after walking about 100 yards.  He does try to walk about 1 mile a day with frequent rest breaks.  He is short of breath walking up stairs.  No chest pain.  He has had some tingling in his fingers/hands, similar to the tingling he gets in his feet.   Labs (5/11): BNP 520, K 4.1, creatinine 1.0 Labs (8/11): BNP 120, HDL 34, LDL 148 Labs (9/11): K 4.7, creatinine 1.2 Labs (12/11): K 3.3 => 5.3, creatinine 0.9 => 1.3, BNP 918 => 199 Labs (2/12): K 3.9, creatinine 1.11  ECG: NSR, normal  Allergies (verified):  1)  ! Niacin  Past Medical History: 1. HYPERTENSION (ICD-401.9) 2. DYSLIPIDEMIA (ICD-272.4) 3. BENIGN PROSTATIC HYPERTROPHY, HX OF (ICD-V13.8) 4. DIABETIC PERIPHERAL NEUROPATHY (ICD-250.60) 5. DM (ICD-250.00) 6. OSTEOARTHRITIS, SHOULDER, RIGHT (ICD-715.91) 7. TOBACCO USE, QUIT (ICD-V15.82) 8. CAD: NSTEMI (12/10).  LHC showed sequential 90% mid LAD stenoses and 90% stenosis at the mid to distal LAD, D1 totally occluded, OM1 subtotally occluded, 70% mid CFX, EF 50%.  CABG (1/11): LIMA-LAD, SVG-D, SVG-OM, SVG-PLV.  9.  CVA: peri-catheterization in 12/10.  Patient initially developed double vision and MRI showed right dorsal mid-brain infarct.  Carotid dopplers with no significant disease.  Double vision has resolved.  10.  Diastolic CHF: Pulmonary edema with NSTEMI.  Echo (12/10) showed EF 50-55% with mid to apical septal HK and mild mitral regurgitation.  Echo (3/11) when admitted with CHF exacerbation: Moderate LVH, EF 60%,  valves ok.  11. Bladder cancer s/p resection 7/11 12. 3 week event monitor (8/11): no significant arrhythmias, occasional PVCs   Family History: Father died at age 81 from an embolic cerebrovascular accident from a deep vein thrombosis in his lower extremity.  His  brother died in his 41s from similar circumstances of an embolic CVA from a lower extremity deep vein thrombosis.  Mother died at age 59 without health problems prior to that.  The patient has a sister who died in her 66s from sequelae of a war injury.  He has a sister who is alive and is 59 years old with osteoarthritis.  He has 3 sons who are alive and healthy.   Social History: The patient is married.  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  He has had a distant smoking history, 50-pack years and quit 20 years ago.  He drinks alcohol occasionally.  He does not use drugs.   Review of Systems        All systems reviewed and negative except as per HPI.   Current Outpatient Prescriptions  Medication Sig Dispense Refill  . acetaminophen (TYLENOL) 325 MG tablet Take 325 mg by mouth as needed.        . Ascorbic Acid (VITAMIN C) 500 MG tablet Take 500 mg by mouth daily.        Marland Kitchen aspirin 325 MG tablet Take 325 mg by mouth daily.        Marland Kitchen atorvastatin (  LIPITOR) 80 MG tablet Take 80 mg by mouth daily.        . B-D UF III MINI PEN NEEDLES 31G X 5 MM MISC       . carvedilol (COREG) 12.5 MG tablet Take 12.5 mg by mouth 2 (two) times daily with a meal.        . doxazosin (CARDURA) 8 MG tablet Take 8 mg by mouth at bedtime.        . Ergocalciferol (VITAMIN D2) 400 UNITS TABS Take 400 Units by mouth daily.        . fish oil-omega-3 fatty acids 1000 MG capsule Take 1 g by mouth daily.        Marland Kitchen glipiZIDE (GLUCOTROL) 10 MG tablet Take 10 mg by mouth daily.        . insulin glargine (LANTUS) 100 UNIT/ML injection Inject 30 Units into the skin daily.        Marland Kitchen lisinopril (PRINIVIL,ZESTRIL) 20 MG tablet Take 20  mg by mouth daily.        . metFORMIN (GLUCOPHAGE) 500 MG tablet Take 500 mg by mouth 2 (two) times daily with a meal. Take 2 tablets      . nabumetone (RELAFEN) 500 MG tablet Take 500 mg by mouth 2 (two) times daily.        . nitroGLYCERIN (NITROSTAT) 0.4 MG SL tablet Place 0.4 mg under the tongue every 5 (five) minutes as needed.        . Tamsulosin HCl (FLOMAX) 0.4 MG CAPS Take 0.4 mg by mouth.        . vitamin E 400 UNIT capsule Take 400 Units by mouth daily.        Marland Kitchen DISCONTD: aspirin 81 MG tablet Take 81 mg by mouth daily.        Marland Kitchen DISCONTD: furosemide (LASIX) 40 MG tablet Take one tab in the morning and 1/2 tab in the evening.       Marland Kitchen DISCONTD: potassium chloride SA (K-DUR,KLOR-CON) 20 MEQ tablet Take 20 mEq by mouth 2 (two) times daily.        . furosemide (LASIX) 40 MG tablet Take one tablet twice a day  180 tablet  3  . potassium chloride SA (KLOR-CON M20) 20 MEQ tablet Take two tablets in the morning and one tablet in the evening  270 tablet  3    BP 96/60  Pulse 70  Ht 5\' 11"  (1.803 m)  Wt 206 lb 12.8 oz (93.804 kg)  BMI 28.84 kg/m2 General:  Well developed, well nourished, in no acute distress. Neck:  Neck supple, JVP 7-8 cm. No masses, thyromegaly or abnormal cervical nodes. Lungs:  Clear bilaterally to auscultation and percussion. Heart:  Non-displaced PMI, chest non-tender; regular rate and rhythm, S1, S2 without murmurs, rubs or gallops. Carotid upstroke normal, no bruit.  Pedals normal pulses. Trace ankle edema.  Abdomen:  Bowel sounds positive; abdomen soft and non-tender without masses, organomegaly, or hernias noted. No hepatosplenomegaly. Extremities:  No clubbing or cyanosis. Neurologic:  Alert and oriented x 3. Psych:  Normal affect.

## 2010-04-17 NOTE — Assessment & Plan Note (Signed)
Weight is up 14 lbs with increased exertional dyspnea.  He has NYHA class III symptoms.  He has certainly had some dietary indiscretion.  He does not appear severely volume overloaded but I expect he may be retaining a bit of fluid.  I will increase Lasix to 40 mg bid (from 40 qam, 20 qpm) and will increase KCl to 40 qam, 20 qpm.  BMET/BNP in 2 wks.  Followup in 2 months as long as symptoms do not worsen.

## 2010-04-17 NOTE — Assessment & Plan Note (Signed)
Patient has peripheral neuropathy affecting his feet.  I suspect that the tingling in his fingers/hands is probably diabetic peripheral neuropathy as well.  Fits stocking/glove distribution.

## 2010-04-17 NOTE — Assessment & Plan Note (Signed)
Will need to check lipids/LFTs, goal LDL < 70.

## 2010-05-01 ENCOUNTER — Other Ambulatory Visit (INDEPENDENT_AMBULATORY_CARE_PROVIDER_SITE_OTHER): Payer: Medicare Other | Admitting: *Deleted

## 2010-05-01 DIAGNOSIS — I25810 Atherosclerosis of coronary artery bypass graft(s) without angina pectoris: Secondary | ICD-10-CM

## 2010-05-01 DIAGNOSIS — I5032 Chronic diastolic (congestive) heart failure: Secondary | ICD-10-CM

## 2010-05-01 DIAGNOSIS — R0602 Shortness of breath: Secondary | ICD-10-CM

## 2010-05-01 DIAGNOSIS — I509 Heart failure, unspecified: Secondary | ICD-10-CM

## 2010-05-01 LAB — BASIC METABOLIC PANEL
BUN: 29 mg/dL — ABNORMAL HIGH (ref 6–23)
CO2: 25 mEq/L (ref 19–32)
Calcium: 9.3 mg/dL (ref 8.4–10.5)
Creatinine, Ser: 1.7 mg/dL — ABNORMAL HIGH (ref 0.4–1.5)

## 2010-05-11 ENCOUNTER — Other Ambulatory Visit (INDEPENDENT_AMBULATORY_CARE_PROVIDER_SITE_OTHER): Payer: Medicare Other | Admitting: *Deleted

## 2010-05-11 DIAGNOSIS — I509 Heart failure, unspecified: Secondary | ICD-10-CM

## 2010-05-11 DIAGNOSIS — I5032 Chronic diastolic (congestive) heart failure: Secondary | ICD-10-CM

## 2010-05-11 LAB — BASIC METABOLIC PANEL
BUN: 29 mg/dL — ABNORMAL HIGH (ref 6–23)
Calcium: 9.1 mg/dL (ref 8.4–10.5)
Creatinine, Ser: 1.5 mg/dL (ref 0.4–1.5)
GFR: 50.23 mL/min — ABNORMAL LOW (ref >60.00)
Glucose, Bld: 178 mg/dL — ABNORMAL HIGH (ref 70–99)
Sodium: 136 mEq/L (ref 135–145)

## 2010-05-23 NOTE — Assessment & Plan Note (Signed)
OFFICE VISIT   Joshua Vincent, ZAHLER  DOB:  19-Sep-1934                                        February 23, 2009  CHART #:  [id number]   CURRENT PROBLEMS:  1. Status post coronary artery bypass graft x4 February 07, 2009 for      severe three-vessel coronary artery disease status post      subendocardial myocardial infarction.  2. Cerebrovascular accident December 2010, probable thromboembolic      origin from the aortic arch at the time of catheterization      (superior right brainstem).  3. Diabetes mellitus.  4. Hypertension.  5. History of osteoarthritis of his right shoulder.  6. Benign prostatic hypertrophy.   PRESENT ILLNESS:  The patient returns for his first office visit after  multivessel bypass grafting for severe three-vessel disease and a  preoperative subendocardial MI.  He has done well at home without  recurrent angina or symptoms of CHF.  The surgical incisions are healing  well.  His main complaint is his left shoulder and left hand with pain,  a weak left hand grip, and numbness in the ulnar distribution of his  left hand.  He has a history of osteoarthritis, right greater than left  shoulder with decreased range of motion.  He denies cough or shortness  of breath.  He is on his post discharge medications and he has requested  more pain medicine (Mobic and oxycodone).   PHYSICAL EXAMINATION:  Vital Signs:  Blood pressure 140/70, pulse 90 and  regular, respirations 18, and saturation on room air 93%.  General:  He  is alert and pleasant.  Lungs:  Breath sounds are clear and equal.  The  sternum is stable and well healed.  Cardiac:  Rhythm is regular and  there is no S3 gallop or murmur.  Extremities:  The right leg incision  is healed.  There is no significant pedal edema.  His left hand grip is  somewhat weak.  He states he has decreased sensation in his left hand  fourth and fifth fingers.  Both shoulders have reduced range of motion.  The thorax is stable.   A PA and lateral chest x-ray shows status post CABG with a small left  pleural effusion.   IMPRESSION AND PLAN:  The patient has a stretch-type injury to his left  brachial plexus from the sternotomy with some left ulnar paresis.  We  will refer him back to his orthopedic surgeon, Dr. Regino Schultze.  He is also  experiencing trouble voiding.  We will refer him back to his urologist,  Dr. Isabel Caprice.  I have provided him with more pain medication for his left  hand and arm and I have encouraged him to walk more.  He can resume  driving, but he knows not to lift more than 20 pounds until 3 months  after surgery.  I plan on seeing him back with a followup chest x-ray to  assess the left pleural effusion in 2 weeks.   Joshua Vincent, M.D.  Electronically Signed   PV/MEDQ  D:  02/23/2009  T:  02/24/2009  Job:  [id number]   cc:   Veverly Fells. Excell Seltzer, MD  L. Lupe Carney, M.D.

## 2010-05-23 NOTE — H&P (Signed)
HISTORY AND PHYSICAL EXAMINATION   February 02, 2009   Re:  Joshua Vincent, Joshua Vincent          DOB:  [birth year]   ADMISSION DIAGNOSES:  1. Severe three-vessel coronary artery disease status post      subendocardial myocardial infarction, December 17 2008.  2. Postcatheterization embolic right mid brain stroke with diplopia      and left-sided weakness, resolved.   HISTORY OF PRESENT ILLNESS:  The patient returns for followup to  schedule elective multivessel bypass grafting for severe three-vessel  coronary artery disease.  This was diagnosed at the time of his  presentation in mid December when he developed chest pain, shortness of  breath, and was admitted to the emergency room with positive cardiac  enzymes and pulmonary edema on x-ray.  He is a poorly controlled  diabetic, hypertensive Caucasian male with EF of 50% and trivial mitral  regurgitation on his echo.  His subsequent cardiac cath demonstrated  severe three-vessel disease with 90-95% stenosis of the LAD, 80%  stenosis of the circumflex, and 50-60% stenosis of the right coronary.  There is a diagonal branch of the LAD which is occluded.  Several hours  after the cath, he developed neurologic changes and brain MRI showed a  stroke in the left mid brain.  He was treated with physical therapy and  subsequently discharged to allow recovery from the stroke before  scheduling elective surgery.  Fortunately since his return home, he has  had no recurrent symptoms of angina, palpitations, CHF, and his  neurologic status is now back to baseline.  He was not given any Plavix  at time of his hospital discharge.   CURRENT MEDICATIONS:  1. Aspirin 325 mg daily.  2. Coreg 12.5 mg b.i.d.  3. Colace 100 mg b.i.d.  4. Lasix 20 mg daily.  5. Potassium 10 mEq once daily.  6. Imdur 30 mg daily.  7. Nitroglycerin tablets p.r.n.  8. Norvasc 10 mg daily.  9. Crestor 40 mg daily.  10.Fish oil 1 daily.  11.Glipizide XL 10 mg  daily.  12.Lantus insulin 30 units at bedtime.  13.Metformin 1 g p.o. b.i.d.  14.Lisinopril 40 mg daily.  15.Vitamins E and D.  16.Coenzyme 10.   ALLERGIES:  None.   PAST MEDICAL HISTORY:  1. Diabetes mellitus type 2, most recent A1c 8.4.  2. Hypertension.  3. Dyslipidemia.  4. BPH status post TURP.  5. Left inguinal hernia.  6. Osteoarthritis of his right shoulder.  7. Varicose veins, left greater than right leg.   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  He stopped smoking over 25 years ago.  He drinks alcohol  minimally.   FAMILY HISTORY:  Positive for stroke, diabetes, and DVT.   REVIEW OF SYSTEMS:  Following his stroke in the hospital, he had Doppler  studies which showed no significant disease.  He does have bilateral  positive Allen test on Doppler peripheral vascular exam.  He had a CT of  the chest which shows calcification and atherosclerotic disease of the  distal aortic arch, possibly the source of the atheroma at the time of  cath.  The ascending aorta appears to the minimally diseased.  The  examination is somewhat limited because the patient refused IV contrast,  however.  The patient denies any history of bleeding or prior blood  transfusions.  He did have an episode of feeling warm and sweaty this  morning and got up  and took a shower and the sensation resolved.  There  is no associated chest pain, dizziness, or shortness of breath.  He  states his blood sugars have been running in the 80-100 range.  He has  been bothered by left inguinal hernia, probably related to straining at  stool.  He has had surgery for BPH in the past.   PHYSICAL EXAMINATION:  The patient is 5 feet 11 inches, weighs 198  pounds, blood pressure 140/70, pulse 72 and regular in sinus,  respirations 18, saturation 98%.  He is an alert middle-aged Caucasian  male no distress.  HEENT exam is normocephalic.  Pupils are equally.  EOMs are full.  Dentition  is full upper and lower dental plates.  Neck  is without JVD, mass, or carotid bruit.  Lymphatics reveal no palpable  supraclavicular or cervical adenopathy.  Breath sounds are clear.  There  is no thoracic deformity.  Cardiac exam is regular rhythm without S3,  gallop, or murmur.  Abdominal exam is soft, nontender without pulsatile  mass.  Extremities reveal varicosities, severe in the left leg below the  knee, minimal in the right leg below the knee.  He has no significant  peripheral edema.  Peripheral pulses are 2+ in all extremities, and  there is no clubbing or cyanosis.  Neurologic exam is alert and oriented  without focal motor deficit, normal gait.   IMPRESSION AND PLAN:  The patient has severe three-vessel disease with  mild left ventricular dysfunction status post prior subendocardial  myocardial infarction.  He has resolved the neurologic changes from the  stroke, probably induced by the cardiac catheterization.  A CT scan  today shows some calcification atherosclerotic disease at the distal  arch.  The ascending aorta looks to be with only minimal focal disease,  and I feel that placement of a clamp for the cardioplegic rest of the  heart and placement proximal anastomosis is indicated.   PLAN:  The patient will be scheduled for multivessel bypass grafting on  Monday, February 07, 2009.  I discussed the indications, benefits,  alternatives, and risks with the patient.  He understands and is  agreeable.  He will stop his fish oil now and will stop the metformin  which he is taking 40 hours prior to procedure.  He will take his beta-  blocker and calcium channel blocker on the morning of surgery.   Kerin Perna, M.D.  Electronically Signed   PV/MEDQ  D:  02/02/2009  T:  02/02/2009  Job:  [id number]   cc:   Marca Ancona, MD

## 2010-05-23 NOTE — Assessment & Plan Note (Signed)
OFFICE VISIT   Joshua Vincent, Joshua Vincent  DOB:  [birth year]                                        March 02, 2009  CHART #:  [id number]   HISTORY:  The patient comes in today for 2-week followup.  He is status  post coronary artery bypass grafting on February 07, 2009.  He was last  seen in the office on February 23, 2009 and at that time was complaining  of left shoulder and hand pain and weakness.  Dr. Donata Clay felt that  this is most likely left brachial plexus stretch type injury secondary  to his sternotomy.  He had recommended followup with his orthopedic  surgeon Dr. Regino Schultze.  However, the patient has not been seen by the  orthopedist at this point.  He actually states that over the last 2  days, the pain and weakness have improved significantly.  He overall is  doing very well.  He is ambulating quite a bit.  His appetite is good.  He is resting well, though he still has issues with lying flat secondary  to discomfort.  He denies any shortness of breath, but he continues to  have some lower extremity edema.   PHYSICAL EXAMINATION:  Blood pressure is 115/61, pulse is 74,  respirations 18, and O2 sat 95% on room air.  His sternotomy incision is  well healed as well as his chest tube sites.  His sternum is stable to  palpation.  Heart has regular rate and rhythm without murmurs, rubs, or  gallops.  Lungs are clear, very slightly diminished in the left base.  Extremities show 1+ edema bilaterally.  His right lower extremity EVH  site is healing well.   A chest x-ray today shows improvement of his left pleural effusion with  only a tiny amount of residual fluid remaining.   ASSESSMENT/PLAN:  The patient is recovering nicely status post coronary  artery bypass grafting.  He was seen by Dr. Donata Clay today and we will  plan to see him back again in the office in 2 weeks with a followup  chest x-ray to make sure that this left effusion has completely  resolved.   In the interim, he is asked to continue his current  medication regimen.  He is given a refill of oxycodone, #20 for any  persistent arm or hand pain.  He also was given a return to work note.  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  Dr. Donata Clay has released him to return  to work on March 08, 2009 to his regular duties with the exception of  lifting over 20 pounds until after April 08, 2009.  He may call in the  interim if he experiences any further problems or has questions.   Kerin Perna, M.D.  Electronically Signed   GC/MEDQ  D:  03/02/2009  T:  03/03/2009  Job:  [id number]   cc:   Veverly Fells. Excell Seltzer, MD  L. Lupe Carney, M.D.

## 2010-05-23 NOTE — Assessment & Plan Note (Signed)
OFFICE VISIT   Joshua Vincent, Joshua Vincent  DOB:  04/08/1934                                        March 16, 2009  CHART #:  [id number]   CURRENT PROBLEMS:  1. Status post coronary artery bypass graft x4 February 07, 2009, for      severe three-vessel coronary disease and history of subendocardial      myocardial infarction in December 2010.  2. Postcatheterization embolic right midbrain stroke, December 2010,      now resolved.  3. Postoperative left pleural effusion, now resolved.  4. Left arm weakness and pain secondary to brachial plexus stretch      from his sternotomy, slowly improving.  5. History of osteoarthritis of both shoulders with decreased range of      motion.  6. Diabetes mellitus.   PRESENT ILLNESS:  The patient returns for a final postoperative  evaluation with chest x-ray.  We have been following him for a residual  left pleural effusion which has slowly improved and now on today's x-ray  is resolved.  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  He is losing weight and has entered the outpatient cardiac  rehab program.  His main complaint is with his left arm and hand which  have been painful, weak, and requiring pain medication (oxycodone).  He  is also checked in with his urologist who has made a medication change  for his BPH.  His other issues involved his hypertension and he has been  slowly resuming his preoperative medications, but today we have asked  him to resume his Norvasc 10 mg a day for increasing blood pressure now  over a month post surgery.   PHYSICAL EXAMINATION:  Blood pressure 170/90, pulse 94, respirations 18,  saturation 97%.  He appears to be very well today and comfortable.  Breath sounds are clear and equal.  The sternum is well healed, and his  cardiac rhythm is regular, consistent with sinus rhythm.  He has no  pedal edema and leg incisions are healed.  His left arm is still  problematic with  some hand weakness, weak grip, and pain running from  shoulder to his hand.  He also has some signs of numbness in the left  fifth and fourth fingers.   LABORATORY DATA:  PA and lateral chest x-ray today shows clear lung  fields with resolution of the previous left mild pleural effusion.   PLAN:  We will refer the patient to Physical Therapy for his left hand  brachial plexopathy and appointment with [facility]  on [address] has been arranged.  We will make a copy of the  patient's records to go with him for his initial evaluation.  He will  continue outpatient cardiac rehab.  He will resume his Norvasc 10 mg  daily in addition to his other blood pressure medication including the  metoprolol, lisinopril.   PLAN:  The patient will return here as needed.  Hopefully, his left hand  will improve with time and some physical therapy.  He was given one more  prescription for oxycodone, but he knows that this will not be  continuously refilled now since he is 6 weeks following surgery.  He  will return to the care of his cardiologist and primary care  physicians  and return here as needed.   Kerin Perna, M.D.  Electronically Signed   PV/MEDQ  D:  03/16/2009  T:  03/16/2009  Job:  [id number]   cc:   Marca Ancona, MD

## 2010-06-02 ENCOUNTER — Encounter: Payer: Self-pay | Admitting: Cardiology

## 2010-06-23 ENCOUNTER — Encounter: Payer: Self-pay | Admitting: Physician Assistant

## 2010-06-23 ENCOUNTER — Ambulatory Visit: Payer: Medicare Other | Admitting: Cardiology

## 2010-06-23 ENCOUNTER — Ambulatory Visit (INDEPENDENT_AMBULATORY_CARE_PROVIDER_SITE_OTHER): Payer: Medicare Other | Admitting: Physician Assistant

## 2010-06-23 VITALS — BP 130/80 | HR 82 | Ht 71.0 in | Wt 202.0 lb

## 2010-06-23 DIAGNOSIS — I2510 Atherosclerotic heart disease of native coronary artery without angina pectoris: Secondary | ICD-10-CM

## 2010-06-23 DIAGNOSIS — E785 Hyperlipidemia, unspecified: Secondary | ICD-10-CM

## 2010-06-23 DIAGNOSIS — I5032 Chronic diastolic (congestive) heart failure: Secondary | ICD-10-CM

## 2010-06-23 DIAGNOSIS — I509 Heart failure, unspecified: Secondary | ICD-10-CM

## 2010-06-23 LAB — BASIC METABOLIC PANEL
Calcium: 9.5 mg/dL (ref 8.4–10.5)
Chloride: 103 mEq/L (ref 96–112)
Creatinine, Ser: 1 mg/dL (ref 0.4–1.5)
GFR: 82.46 mL/min (ref >60.00)

## 2010-06-23 NOTE — Assessment & Plan Note (Signed)
Resume statin.  Check lipids in 3 months.

## 2010-06-23 NOTE — Patient Instructions (Signed)
Your physician recommends that you schedule a follow-up appointment in: 4 months with Dr Shirlee Latch  Your physician recommends that you return for lab work in: today BMP

## 2010-06-23 NOTE — Assessment & Plan Note (Signed)
Doing well.  No angina.  Continue aspirin.  We will provide him with samples of Crestor and then he will refill his Lipitor.  Follow  up with Dr. Shirlee Latch in 4 months.

## 2010-06-23 NOTE — Assessment & Plan Note (Signed)
Volume stable.  Continue current dose of Lasix.  Check a basic metabolic panel today.

## 2010-06-23 NOTE — Progress Notes (Signed)
History of Present Illness: Primary Cardiologist:  Dr. Marca Ancona PCP: Dr. Loletta Specter Joshua Vincent is a 76 y.o. male with history of CAD s/p NSTEMI accompanied by pulmonary edema and peri-catheterization CVA, now s/p CABG presents for followup. He has also had significant diastolic CHF. He had an inguinal hernia repair in 2/12 without complication. He saw Dr. Shirlee Latch 2 months ago.  His Lasix was adjusted due to weight gain.  His creatinine increased with this and his Lasix was adjusted again.  He denies any significant complaints.  He has mild shortness of breath with exertion.  He is probable NYHA class II.  He denies orthopnea or PND.  He denies significant edema.  He denies syncope.  He has an occasional chest pain.  This seems to be related to acid reflux.  He denies any recurrent angina.  He continues to have neuralgia-type pains in his left hand.  He says that this was from a needle stick around the time of his bypass.  It has been there ever since.  He was supposed to see a neurologist.  However, he could not afford this.  Of note, he has not taken several of his medications over the last week.  He just got paid again and plans to restart these next week.  I am going to give him a prescription for carvedilol and lisinopril to take to [facility] so that he can obtain these.  We also will give him samples of Crestor to use in place of Lipitor until he gets this filled.  Labs (5/11): BNP 520, K 4.1, creatinine 1.0  Labs (8/11): BNP 120, HDL 34, LDL 148  Labs (9/11): K 4.7, creatinine 1.2  Labs (12/11): K 3.3 => 5.3, creatinine 0.9 => 1.3, BNP 918 => 199  Labs (2/12): K 3.9, creatinine 1.11  Labs (4/12): K 4.1, creatinine 1.7  Past Medical History:  1. HYPERTENSION (ICD-401.9)  2. DYSLIPIDEMIA (ICD-272.4)  3. BENIGN PROSTATIC HYPERTROPHY, HX OF (ICD-V13.8)  4. DIABETIC PERIPHERAL NEUROPATHY (ICD-250.60)  5. DM (ICD-250.00)  6. OSTEOARTHRITIS, SHOULDER, RIGHT (ICD-715.91)  7. TOBACCO USE,  QUIT (ICD-V15.82)  8. CAD: NSTEMI (12/10). LHC showed sequential 90% mid LAD stenoses and 90% stenosis at the mid to distal LAD, D1 totally occluded, OM1 subtotally occluded, 70% mid CFX, EF 50%. CABG (1/11): LIMA-LAD, SVG-D, SVG-OM, SVG-PLV.  9. CVA: peri-catheterization in 12/10. Patient initially developed double vision and MRI showed right dorsal mid-brain infarct. Carotid dopplers with no significant disease. Double vision has resolved.  10. Diastolic CHF: Pulmonary edema with NSTEMI. Echo (12/10) showed EF 50-55% with mid to apical septal HK and mild mitral regurgitation. Echo (3/11) when admitted with CHF exacerbation: Moderate LVH, EF 60%, valves ok.  11. Bladder cancer s/p resection 7/11  12. 3 week event monitor (8/11): no significant arrhythmias, occasional PVCs     Current Outpatient Prescriptions  Medication Sig Dispense Refill  . acetaminophen (TYLENOL) 325 MG tablet Take 325 mg by mouth as needed.        . Ascorbic Acid (VITAMIN C) 500 MG tablet Take 500 mg by mouth daily.        Marland Kitchen aspirin 325 MG tablet Take 325 mg by mouth daily.        Marland Kitchen doxazosin (CARDURA) 8 MG tablet Take 8 mg by mouth at bedtime.        . Ergocalciferol (VITAMIN D2) 400 UNITS TABS Take 400 Units by mouth daily.        . fish oil-omega-3 fatty  acids 1000 MG capsule Take 1 g by mouth daily.        . furosemide (LASIX) 40 MG tablet daily.        . insulin glargine (LANTUS) 100 UNIT/ML injection Inject 30 Units into the skin daily.        . metFORMIN (GLUCOPHAGE) 500 MG tablet Take 500 mg by mouth 2 (two) times daily with a meal. Take 2 tablets      . nabumetone (RELAFEN) 500 MG tablet Take 500 mg by mouth 2 (two) times daily.        . nitroGLYCERIN (NITROSTAT) 0.4 MG SL tablet Place 0.4 mg under the tongue every 5 (five) minutes as needed.        . potassium chloride SA (KLOR-CON M20) 20 MEQ tablet Take two tablets in the morning and one tablet in the evening  270 tablet  3  . vitamin E 400 UNIT capsule Take  400 Units by mouth daily.        Marland Kitchen DISCONTD: furosemide (LASIX) 40 MG tablet Take one tablet twice a day  180 tablet  3  . atorvastatin (LIPITOR) 80 MG tablet Take 80 mg by mouth daily.        . B-D UF III MINI PEN NEEDLES 31G X 5 MM MISC       . carvedilol (COREG) 12.5 MG tablet Take 12.5 mg by mouth 2 (two) times daily with a meal.        . glipiZIDE (GLUCOTROL) 10 MG tablet Take 10 mg by mouth daily.        Marland Kitchen lisinopril (PRINIVIL,ZESTRIL) 20 MG tablet Take 20 mg by mouth daily.        . Tamsulosin HCl (FLOMAX) 0.4 MG CAPS Take 0.4 mg by mouth.          Allergies: Allergies  Allergen Reactions  . Niacin     REACTION: itching    Vital Signs: BP 130/80  Pulse 82  Ht 5\' 11"  (1.803 m)  Wt 202 lb (91.627 kg)  BMI 28.17 kg/m2  PHYSICAL EXAM: Well nourished, well developed, in no acute distress HEENT: normal Neck: no JVD Cardiac:  normal S1, S2; RRR; no murmur Lungs:  clear to auscultation bilaterally, no wheezing, rhonchi or rales Abd: soft, nontender, no hepatomegaly Ext: Trace bilateral ankle edema Skin: warm and dry Neuro:  CNs 2-12 intact, no focal abnormalities noted  ASSESSMENT AND PLAN:

## 2010-06-23 NOTE — Assessment & Plan Note (Addendum)
Surprisingly controlled without blood pressure medications.  As noted, I gave him a prescription for lisinopril and carvedilol to take to [facility] so that he can obtain these medications.  He will get his other medications through the mail in about one to 2 weeks.

## 2010-08-22 ENCOUNTER — Telehealth: Payer: Self-pay | Admitting: Cardiology

## 2010-08-22 NOTE — Telephone Encounter (Signed)
Per pt call pt needs list of RX printed out for pt to come pick up. Please call pt when RX list is ready.

## 2010-08-23 NOTE — Telephone Encounter (Signed)
SEE OTHER PHONE NOTE./CY 

## 2010-08-23 NOTE — Telephone Encounter (Signed)
LEFT MESSAGE ON AM , MED LIST LEFT AT FRONT DESK FOR PICK UP .Zack Seal

## 2010-08-24 ENCOUNTER — Other Ambulatory Visit: Payer: Self-pay | Admitting: *Deleted

## 2010-08-24 DIAGNOSIS — E785 Hyperlipidemia, unspecified: Secondary | ICD-10-CM

## 2010-08-24 DIAGNOSIS — I10 Essential (primary) hypertension: Secondary | ICD-10-CM

## 2010-08-24 DIAGNOSIS — I5032 Chronic diastolic (congestive) heart failure: Secondary | ICD-10-CM

## 2010-08-24 DIAGNOSIS — I25810 Atherosclerosis of coronary artery bypass graft(s) without angina pectoris: Secondary | ICD-10-CM

## 2010-08-24 MED ORDER — LISINOPRIL 20 MG PO TABS: 20.0000 mg | ORAL_TABLET | Freq: Every day | ORAL | Status: DC

## 2010-08-24 MED ORDER — POTASSIUM CHLORIDE CRYS ER 20 MEQ PO TBCR: EXTENDED_RELEASE_TABLET | ORAL | Status: DC

## 2010-08-24 MED ORDER — NITROGLYCERIN 0.4 MG SL SUBL: 0.4000 mg | SUBLINGUAL_TABLET | SUBLINGUAL | Status: DC | PRN

## 2010-08-24 MED ORDER — FUROSEMIDE 40 MG PO TABS: 40.0000 mg | ORAL_TABLET | Freq: Every day | ORAL | Status: DC

## 2010-08-24 MED ORDER — ATORVASTATIN CALCIUM 80 MG PO TABS: 80.0000 mg | ORAL_TABLET | Freq: Every day | ORAL | Status: DC

## 2010-08-24 MED ORDER — CARVEDILOL 12.5 MG PO TABS: 12.5000 mg | ORAL_TABLET | Freq: Two times a day (BID) | ORAL | Status: DC

## 2010-08-24 MED ORDER — DOXAZOSIN MESYLATE 8 MG PO TABS: 8.0000 mg | ORAL_TABLET | Freq: Every day | ORAL | Status: DC

## 2010-10-19 ENCOUNTER — Other Ambulatory Visit: Payer: Medicare Other | Admitting: *Deleted

## 2010-10-19 ENCOUNTER — Ambulatory Visit (INDEPENDENT_AMBULATORY_CARE_PROVIDER_SITE_OTHER): Payer: Medicare Other | Admitting: Cardiology

## 2010-10-19 ENCOUNTER — Encounter: Payer: Self-pay | Admitting: Cardiology

## 2010-10-19 ENCOUNTER — Other Ambulatory Visit: Payer: Self-pay | Admitting: Physician Assistant

## 2010-10-19 DIAGNOSIS — I2510 Atherosclerotic heart disease of native coronary artery without angina pectoris: Secondary | ICD-10-CM

## 2010-10-19 DIAGNOSIS — E785 Hyperlipidemia, unspecified: Secondary | ICD-10-CM

## 2010-10-19 DIAGNOSIS — I5032 Chronic diastolic (congestive) heart failure: Secondary | ICD-10-CM

## 2010-10-19 DIAGNOSIS — I509 Heart failure, unspecified: Secondary | ICD-10-CM

## 2010-10-19 LAB — LDL CHOLESTEROL, DIRECT: Direct LDL: 213.6 mg/dL

## 2010-10-19 LAB — LIPID PANEL
Cholesterol: 302 mg/dL — ABNORMAL HIGH (ref 0–200)
HDL: 43.4 mg/dL (ref >39.00)
Total CHOL/HDL Ratio: 7
Triglycerides: 162 mg/dL — ABNORMAL HIGH (ref 0.0–149.0)
VLDL: 32.4 mg/dL (ref 0.0–40.0)

## 2010-10-19 LAB — HEPATIC FUNCTION PANEL
Albumin: 3.5 g/dL (ref 3.5–5.2)
Total Protein: 6.8 g/dL (ref 6.0–8.3)

## 2010-10-19 NOTE — Patient Instructions (Signed)
Your physician wants you to follow-up in: 4 months with Dr McLean. (February 2013). You will receive a reminder letter in the mail two months in advance. If you don't receive a letter, please call our office to schedule the follow-up appointment.  

## 2010-10-20 ENCOUNTER — Telehealth: Payer: Self-pay | Admitting: Cardiology

## 2010-10-20 DIAGNOSIS — I2510 Atherosclerotic heart disease of native coronary artery without angina pectoris: Secondary | ICD-10-CM

## 2010-10-20 DIAGNOSIS — E785 Hyperlipidemia, unspecified: Secondary | ICD-10-CM

## 2010-10-20 NOTE — Telephone Encounter (Signed)
Pt said someone called him. I could not find record of outgoing call. His cholesterol results are in with recommendations by Maish Vaya PA. I have given him those result.s Pt is taking his Lipitor 80mg . He will contact [employer] for price on Crestor as he recalls this was expensive in the past. I did offer samples of Crestor but asked him to find out first if he would be able to afford this once started. Pt will call back later today and let me know. Mylo Red RN

## 2010-10-20 NOTE — Telephone Encounter (Signed)
Pt returns call and has found Crestor to be $100/3 month supply.  He will be filling out co-pay assistance form from his insurance. He will call back next week as to whether he will switch to Crestor 40mg .  Mylo Red RN

## 2010-10-20 NOTE — Telephone Encounter (Signed)
Pt returning your call

## 2010-10-21 NOTE — Assessment & Plan Note (Addendum)
Weight is up 4 lbs since last appointment but he does not appear to be significantly volume overloaded.  I suspect this weight gain is due to lack of exercise in setting of back pain.  I will have him continue the current dose of Lasix and KCl.

## 2010-10-21 NOTE — Assessment & Plan Note (Signed)
Stable with no ischemic symptoms.  Continue ASA, Coreg, ACEI, and statin.

## 2010-10-21 NOTE — Progress Notes (Signed)
PCP: Dr. Clovis Riley  76 yo with history of CAD s/p NSTEMI in 12/10 accompanied by pulmonary edema and peri-catheterization CVA, now s/p CABG presents for followup.  He has also had significant diastolic CHF.    Main symptomatic issue recently has been back pain.  Epidural steroid injection did not help.  He has not been able to walk much because of the back pain and has gained 4 lbs since I last saw him.  No chest pain.  No dyspnea walking on flat ground but some dyspnea with steps.    Labs (5/11): BNP 520, K 4.1, creatinine 1.0 Labs (8/11): BNP 120, HDL 34, LDL 148 Labs (9/11): K 4.7, creatinine 1.2 Labs (12/11): K 3.3 => 5.3, creatinine 0.9 => 1.3, BNP 918 => 199 Labs (2/12): K 3.9, creatinine 1.11 Labs (6/12): K 4.3, creatinine 1.0  Allergies (verified):  1)  ! Niacin  Past Medical History: 1. HYPERTENSION (ICD-401.9) 2. DYSLIPIDEMIA (ICD-272.4) 3. BENIGN PROSTATIC HYPERTROPHY, HX OF (ICD-V13.8) 4. DIABETIC PERIPHERAL NEUROPATHY (ICD-250.60) 5. DM (ICD-250.00) 6. OSTEOARTHRITIS, SHOULDER, RIGHT (ICD-715.91) 7. TOBACCO USE, QUIT (ICD-V15.82) 8. CAD: NSTEMI (12/10).  LHC showed sequential 90% mid LAD stenoses and 90% stenosis at the mid to distal LAD, D1 totally occluded, OM1 subtotally occluded, 70% mid CFX, EF 50%.  CABG (1/11): LIMA-LAD, SVG-D, SVG-OM, SVG-PLV.  9.  CVA: peri-catheterization in 12/10.  Patient initially developed double vision and MRI showed right dorsal mid-brain infarct.  Carotid dopplers with no significant disease.  Double vision has resolved.  10.  Diastolic CHF: Pulmonary edema with NSTEMI.  Echo (12/10) showed EF 50-55% with mid to apical septal HK and mild mitral regurgitation.  Echo (3/11) when admitted with CHF exacerbation: Moderate LVH, EF 60%, valves ok.  11. Bladder cancer s/p resection 7/11 12. 3 week event monitor (8/11): no significant arrhythmias, occasional PVCs   Family History: Father died at age 54 from an embolic cerebrovascular accident from a  deep vein thrombosis in his lower extremity.  His  brother died in his 39s from similar circumstances of an embolic CVA from a lower extremity deep vein thrombosis.  Mother died at age 8 without health problems prior to that.  The patient has a sister who died in her 69s from sequelae of a war injury.  He has a sister who is alive and is 71 years old with osteoarthritis.  He has 3 sons who are alive and healthy.   Social History: The patient is married.  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  He has had a distant smoking history, 50-pack years and quit 20 years ago.  He drinks alcohol occasionally.  He does not use drugs.    Current Outpatient Prescriptions  Medication Sig Dispense Refill  . acetaminophen (TYLENOL) 325 MG tablet Take 325 mg by mouth as needed.        . Ascorbic Acid (VITAMIN C) 500 MG tablet Take 500 mg by mouth daily.        Marland Kitchen aspirin 325 MG tablet Take 325 mg by mouth daily.        Marland Kitchen atorvastatin (LIPITOR) 80 MG tablet Take 1 tablet (80 mg total) by mouth daily.  90 tablet  3  . B-D UF III MINI PEN NEEDLES 31G X 5 MM MISC       . carvedilol (COREG) 12.5 MG tablet Take 1 tablet (12.5 mg total) by mouth 2 (two) times daily with a meal.  120 tablet  3  . doxazosin (CARDURA) 8 MG tablet Take 1 tablet (8 mg total) by mouth at bedtime.  90 tablet  3  . Ergocalciferol (VITAMIN D2) 400 UNITS TABS Take 400 Units by mouth daily.        . fish oil-omega-3 fatty acids 1000 MG capsule Take 1 g by mouth daily.        . furosemide (LASIX) 40 MG tablet 40 mg in the morning and 20 mg in the evening       . glipiZIDE (GLUCOTROL) 10 MG tablet Take 10 mg by mouth daily.        . insulin glargine (LANTUS) 100 UNIT/ML injection Inject 35 Units into the skin daily.       Marland Kitchen lisinopril (PRINIVIL,ZESTRIL) 20 MG tablet Take 1 tablet (20 mg total) by mouth daily.  90 tablet  3  . metFORMIN (GLUCOPHAGE) 500 MG tablet Take 500 mg by mouth 2 (two) times daily with a  meal. Take 2 tablets      . nabumetone (RELAFEN) 500 MG tablet Take 500 mg by mouth 2 (two) times daily.        . nitroGLYCERIN (NITROSTAT) 0.4 MG SL tablet Place 1 tablet (0.4 mg total) under the tongue every 5 (five) minutes as needed.  25 tablet  2  . potassium chloride SA (KLOR-CON M20) 20 MEQ tablet Take two tablets in the morning and one tablet in the evening  270 tablet  3  . Tamsulosin HCl (FLOMAX) 0.4 MG CAPS Take 0.4 mg by mouth.        . vitamin E 400 UNIT capsule Take 400 Units by mouth daily.          BP 118/70  Pulse 82  SpO2 99% General:  Well developed, well nourished, in no acute distress. Neck:  Neck supple, JVP 7 cm. No masses, thyromegaly or abnormal cervical nodes. Lungs:  Clear bilaterally to auscultation and percussion. Heart:  Non-displaced PMI, chest non-tender; regular rate and rhythm, S1, S2 without murmurs, rubs or gallops. Carotid upstroke normal, no bruit.  Pedals normal pulses. 1+ ankle edema.  Abdomen:  Bowel sounds positive; abdomen soft and non-tender without masses, organomegaly, or hernias noted. No hepatosplenomegaly. Extremities:  No clubbing or cyanosis. Neurologic:  Alert and oriented x 3. Psych:  Normal affect.

## 2010-10-21 NOTE — Assessment & Plan Note (Signed)
Check lipids, goal LDL < 70.  

## 2010-10-24 ENCOUNTER — Other Ambulatory Visit: Payer: Medicare Other | Admitting: *Deleted

## 2010-10-24 ENCOUNTER — Ambulatory Visit: Payer: Medicare Other | Admitting: Cardiology

## 2010-10-25 MED ORDER — ROSUVASTATIN CALCIUM 40 MG PO TABS: 40.0000 mg | ORAL_TABLET | Freq: Every day | ORAL | Status: DC

## 2010-10-25 NOTE — Telephone Encounter (Signed)
Notes Recorded by Jacqlyn Krauss, RN on 10/25/2010 at 11:54 AM I talked with pt. Pt has prescription coverage but is checking with the company to see if he can get Crestor at any discount. I have left samples of crestor 20mg  (no samples of 40mg --pt will take two 20mg  tablets daily) at the front desk for pt to pickup along with a Crestor co pay card. I will send a prescription into [facility] for Crestor 40mg  daily. Pt is scheduled for lipid/liver profile 11/24/10. Pt is aware that his LDL is extremely high. I have asked pt to let me know if he is unable to get the Crestor 40mg . Notes Recorded by Beatrice Lecher, PA on 10/19/2010 at 5:46 PM Is he taking Lipitor 80?? LDL is extremely high. If yes, change to Crestor 40 mg QD and repeat FLP and LFTs in 1 month. If no, restart Lipitor and recheck in one month. Cc to Dr. Lenon Curt, PA-C

## 2010-10-31 ENCOUNTER — Other Ambulatory Visit: Payer: Self-pay | Admitting: Cardiology

## 2010-10-31 DIAGNOSIS — I2510 Atherosclerotic heart disease of native coronary artery without angina pectoris: Secondary | ICD-10-CM

## 2010-10-31 DIAGNOSIS — E785 Hyperlipidemia, unspecified: Secondary | ICD-10-CM

## 2010-10-31 MED ORDER — ROSUVASTATIN CALCIUM 40 MG PO TABS: 40.0000 mg | ORAL_TABLET | Freq: Every day | ORAL | Status: DC

## 2010-11-24 ENCOUNTER — Other Ambulatory Visit (INDEPENDENT_AMBULATORY_CARE_PROVIDER_SITE_OTHER): Payer: Medicare Other | Admitting: *Deleted

## 2010-11-24 DIAGNOSIS — I2510 Atherosclerotic heart disease of native coronary artery without angina pectoris: Secondary | ICD-10-CM

## 2010-11-24 DIAGNOSIS — E785 Hyperlipidemia, unspecified: Secondary | ICD-10-CM

## 2010-11-24 LAB — LIPID PANEL
Cholesterol: 122 mg/dL (ref 0–200)
LDL Cholesterol: 51 mg/dL (ref 0–99)
Total CHOL/HDL Ratio: 3
VLDL: 29.4 mg/dL (ref 0.0–40.0)

## 2010-11-24 LAB — HEPATIC FUNCTION PANEL
Albumin: 3.7 g/dL (ref 3.5–5.2)
Alkaline Phosphatase: 91 U/L (ref 39–117)
Bilirubin, Direct: 0.1 mg/dL (ref 0.0–0.3)
Total Protein: 7 g/dL (ref 6.0–8.3)

## 2011-04-26 ENCOUNTER — Other Ambulatory Visit: Payer: Self-pay | Admitting: *Deleted

## 2011-04-26 MED ORDER — POTASSIUM CHLORIDE CRYS ER 20 MEQ PO TBCR: 60.0000 meq | EXTENDED_RELEASE_TABLET | Freq: Every day | ORAL | Status: DC

## 2011-08-19 ENCOUNTER — Inpatient Hospital Stay (HOSPITAL_COMMUNITY)
Admission: EM | Admit: 2011-08-19 | Discharge: 2011-08-23 | DRG: 280 | Disposition: A | Discharge status: Home Health | Payer: Medicare Other | Attending: Cardiology | Admitting: Cardiology

## 2011-08-19 ENCOUNTER — Emergency Department (HOSPITAL_COMMUNITY): Payer: Medicare Other

## 2011-08-19 ENCOUNTER — Encounter (HOSPITAL_COMMUNITY): Payer: Self-pay | Admitting: *Deleted

## 2011-08-19 DIAGNOSIS — E119 Type 2 diabetes mellitus without complications: Secondary | ICD-10-CM

## 2011-08-19 DIAGNOSIS — Z794 Long term (current) use of insulin: Secondary | ICD-10-CM

## 2011-08-19 DIAGNOSIS — E1149 Type 2 diabetes mellitus with other diabetic neurological complication: Secondary | ICD-10-CM

## 2011-08-19 DIAGNOSIS — I5043 Acute on chronic combined systolic (congestive) and diastolic (congestive) heart failure: Secondary | ICD-10-CM | POA: Diagnosis present

## 2011-08-19 DIAGNOSIS — I10 Essential (primary) hypertension: Secondary | ICD-10-CM | POA: Diagnosis present

## 2011-08-19 DIAGNOSIS — Z8673 Personal history of transient ischemic attack (TIA), and cerebral infarction without residual deficits: Secondary | ICD-10-CM

## 2011-08-19 DIAGNOSIS — X58XXXA Exposure to other specified factors, initial encounter: Secondary | ICD-10-CM | POA: Diagnosis present

## 2011-08-19 DIAGNOSIS — I25810 Atherosclerosis of coronary artery bypass graft(s) without angina pectoris: Secondary | ICD-10-CM | POA: Diagnosis present

## 2011-08-19 DIAGNOSIS — M19019 Primary osteoarthritis, unspecified shoulder: Secondary | ICD-10-CM | POA: Diagnosis present

## 2011-08-19 DIAGNOSIS — G4733 Obstructive sleep apnea (adult) (pediatric): Secondary | ICD-10-CM | POA: Diagnosis present

## 2011-08-19 DIAGNOSIS — E1165 Type 2 diabetes mellitus with hyperglycemia: Secondary | ICD-10-CM | POA: Diagnosis present

## 2011-08-19 DIAGNOSIS — I509 Heart failure, unspecified: Secondary | ICD-10-CM | POA: Diagnosis present

## 2011-08-19 DIAGNOSIS — S81809A Unspecified open wound, unspecified lower leg, initial encounter: Secondary | ICD-10-CM | POA: Diagnosis present

## 2011-08-19 DIAGNOSIS — I4949 Other premature depolarization: Secondary | ICD-10-CM | POA: Diagnosis present

## 2011-08-19 DIAGNOSIS — E669 Obesity, unspecified: Secondary | ICD-10-CM | POA: Diagnosis present

## 2011-08-19 DIAGNOSIS — Z9089 Acquired absence of other organs: Secondary | ICD-10-CM

## 2011-08-19 DIAGNOSIS — I214 Non-ST elevation (NSTEMI) myocardial infarction: Principal | ICD-10-CM | POA: Diagnosis present

## 2011-08-19 DIAGNOSIS — I5032 Chronic diastolic (congestive) heart failure: Secondary | ICD-10-CM | POA: Diagnosis present

## 2011-08-19 DIAGNOSIS — J9600 Acute respiratory failure, unspecified whether with hypoxia or hypercapnia: Secondary | ICD-10-CM | POA: Diagnosis present

## 2011-08-19 DIAGNOSIS — N289 Disorder of kidney and ureter, unspecified: Secondary | ICD-10-CM | POA: Diagnosis present

## 2011-08-19 DIAGNOSIS — Y929 Unspecified place or not applicable: Secondary | ICD-10-CM

## 2011-08-19 DIAGNOSIS — Z888 Allergy status to other drugs, medicaments and biological substances status: Secondary | ICD-10-CM

## 2011-08-19 DIAGNOSIS — Z7982 Long term (current) use of aspirin: Secondary | ICD-10-CM

## 2011-08-19 DIAGNOSIS — E1169 Type 2 diabetes mellitus with other specified complication: Secondary | ICD-10-CM | POA: Diagnosis present

## 2011-08-19 DIAGNOSIS — Z87891 Personal history of nicotine dependence: Secondary | ICD-10-CM

## 2011-08-19 DIAGNOSIS — Y832 Surgical operation with anastomosis, bypass or graft as the cause of abnormal reaction of the patient, or of later complication, without mention of misadventure at the time of the procedure: Secondary | ICD-10-CM | POA: Diagnosis present

## 2011-08-19 DIAGNOSIS — Z6831 Body mass index (BMI) 31.0-31.9, adult: Secondary | ICD-10-CM

## 2011-08-19 DIAGNOSIS — Z8551 Personal history of malignant neoplasm of bladder: Secondary | ICD-10-CM

## 2011-08-19 DIAGNOSIS — D509 Iron deficiency anemia, unspecified: Secondary | ICD-10-CM | POA: Diagnosis present

## 2011-08-19 DIAGNOSIS — Y92009 Unspecified place in unspecified non-institutional (private) residence as the place of occurrence of the external cause: Secondary | ICD-10-CM

## 2011-08-19 DIAGNOSIS — E785 Hyperlipidemia, unspecified: Secondary | ICD-10-CM | POA: Diagnosis present

## 2011-08-19 DIAGNOSIS — D649 Anemia, unspecified: Secondary | ICD-10-CM

## 2011-08-19 HISTORY — DX: Type 2 diabetes mellitus without complications: E11.9

## 2011-08-19 HISTORY — DX: Hyperlipidemia, unspecified: E78.5

## 2011-08-19 LAB — CBC WITH DIFFERENTIAL/PLATELET
Eosinophils Absolute: 0 10*3/uL (ref 0.0–0.7)
Eosinophils Relative: 0 % (ref 0–5)
HCT: 35.7 % — ABNORMAL LOW (ref 39.0–52.0)
Hemoglobin: 12.5 g/dL — ABNORMAL LOW (ref 13.0–17.0)
Lymphs Abs: 1 10*3/uL (ref 0.7–4.0)
MCH: 29.8 pg (ref 26.0–34.0)
MCV: 85.2 fL (ref 78.0–100.0)
Monocytes Absolute: 1 10*3/uL (ref 0.1–1.0)
Monocytes Relative: 10 % (ref 3–12)
Platelets: 214 10*3/uL (ref 150–400)
RBC: 4.19 MIL/uL — ABNORMAL LOW (ref 4.22–5.81)

## 2011-08-19 LAB — POCT I-STAT, CHEM 8
Calcium, Ion: 1.22 mmol/L (ref 1.13–1.30)
Creatinine, Ser: 1.4 mg/dL — ABNORMAL HIGH (ref 0.50–1.35)
Glucose, Bld: 122 mg/dL — ABNORMAL HIGH (ref 70–99)
Hemoglobin: 11.9 g/dL — ABNORMAL LOW (ref 13.0–17.0)
TCO2: 21 mmol/L (ref 0–100)

## 2011-08-19 LAB — CARDIAC PANEL(CRET KIN+CKTOT+MB+TROPI)
CK, MB: 7.5 ng/mL (ref 0.3–4.0)
Relative Index: 5.6 — ABNORMAL HIGH (ref 0.0–2.5)
Total CK: 135 U/L (ref 7–232)
Troponin I: 2.33 ng/mL (ref <0.30)

## 2011-08-19 MED ORDER — INSULIN GLARGINE 100 UNIT/ML ~~LOC~~ SOLN
40.0000 [IU] | Freq: Every day | SUBCUTANEOUS | Status: DC
  Administered 2011-08-20: 40 [IU] via SUBCUTANEOUS

## 2011-08-19 MED ORDER — ALBUTEROL SULFATE (5 MG/ML) 0.5% IN NEBU
5.0000 mg | INHALATION_SOLUTION | Freq: Once | RESPIRATORY_TRACT | Status: AC
  Administered 2011-08-19: 5 mg via RESPIRATORY_TRACT
  Filled 2011-08-19: qty 1

## 2011-08-19 MED ORDER — NITROGLYCERIN IN D5W 200-5 MCG/ML-% IV SOLN
10.0000 ug/min | INTRAVENOUS | Status: DC
  Administered 2011-08-19: 10 ug/min via INTRAVENOUS
  Filled 2011-08-19: qty 250

## 2011-08-19 MED ORDER — SODIUM CHLORIDE 0.9 % IJ SOLN: 3.0000 mL | Freq: Two times a day (BID) | INTRAMUSCULAR | Status: DC

## 2011-08-19 MED ORDER — SODIUM CHLORIDE 0.9 % IJ SOLN: 3.0000 mL | INTRAMUSCULAR | Status: DC | PRN

## 2011-08-19 MED ORDER — ASPIRIN 81 MG PO CHEW
324.0000 mg | CHEWABLE_TABLET | Freq: Once | ORAL | Status: AC
  Administered 2011-08-19: 324 mg via ORAL
  Filled 2011-08-19: qty 4

## 2011-08-19 MED ORDER — HEPARIN (PORCINE) IN NACL 100-0.45 UNIT/ML-% IJ SOLN
1300.0000 [IU]/h | INTRAMUSCULAR | Status: DC
  Administered 2011-08-19: 1300 [IU]/h via INTRAVENOUS
  Filled 2011-08-19 (×2): qty 250

## 2011-08-19 MED ORDER — HEPARIN BOLUS VIA INFUSION
4000.0000 [IU] | Freq: Once | INTRAVENOUS | Status: AC
  Administered 2011-08-19: 4000 [IU] via INTRAVENOUS

## 2011-08-19 MED ORDER — ACETAMINOPHEN 325 MG PO TABS
650.0000 mg | ORAL_TABLET | ORAL | Status: DC | PRN
  Administered 2011-08-20: 650 mg via ORAL

## 2011-08-19 MED ORDER — POTASSIUM CHLORIDE CRYS ER 20 MEQ PO TBCR
60.0000 meq | EXTENDED_RELEASE_TABLET | Freq: Every day | ORAL | Status: DC
  Administered 2011-08-20: 60 meq via ORAL
  Filled 2011-08-19: qty 3

## 2011-08-19 MED ORDER — ASPIRIN 325 MG PO TABS: 325.0000 mg | ORAL_TABLET | Freq: Every day | ORAL | Status: DC

## 2011-08-19 MED ORDER — ATORVASTATIN CALCIUM 80 MG PO TABS
80.0000 mg | ORAL_TABLET | Freq: Every day | ORAL | Status: DC
  Filled 2011-08-19: qty 1

## 2011-08-19 MED ORDER — SODIUM CHLORIDE 0.9 % IV SOLN: 250.0000 mL | INTRAVENOUS | Status: DC | PRN

## 2011-08-19 MED ORDER — TRAMADOL HCL 50 MG PO TABS
50.0000 mg | ORAL_TABLET | Freq: Four times a day (QID) | ORAL | Status: DC | PRN
  Filled 2011-08-19: qty 1

## 2011-08-19 MED ORDER — AMLODIPINE BESYLATE 10 MG PO TABS
10.0000 mg | ORAL_TABLET | Freq: Every day | ORAL | Status: DC
  Administered 2011-08-19 – 2011-08-20 (×2): 10 mg via ORAL
  Filled 2011-08-19 (×2): qty 1

## 2011-08-19 MED ORDER — CLOPIDOGREL BISULFATE 300 MG PO TABS
600.0000 mg | ORAL_TABLET | Freq: Once | ORAL | Status: AC
  Administered 2011-08-19: 600 mg via ORAL
  Filled 2011-08-19: qty 2

## 2011-08-19 MED ORDER — GLIPIZIDE 5 MG PO TABS: 5.0000 mg | ORAL_TABLET | Freq: Every day | ORAL | Status: DC

## 2011-08-19 MED ORDER — ASPIRIN 81 MG PO CHEW
324.0000 mg | CHEWABLE_TABLET | ORAL | Status: AC
  Administered 2011-08-20: 324 mg via ORAL
  Filled 2011-08-19: qty 4

## 2011-08-19 MED ORDER — ONDANSETRON HCL 4 MG/2ML IJ SOLN: 4.0000 mg | Freq: Four times a day (QID) | INTRAMUSCULAR | Status: DC | PRN

## 2011-08-19 MED ORDER — NITROGLYCERIN 0.4 MG SL SUBL: 0.4000 mg | SUBLINGUAL_TABLET | SUBLINGUAL | Status: DC | PRN

## 2011-08-19 MED ORDER — CEPHALEXIN 500 MG PO CAPS
500.0000 mg | ORAL_CAPSULE | Freq: Four times a day (QID) | ORAL | Status: DC
  Administered 2011-08-19 – 2011-08-20 (×2): 500 mg via ORAL
  Filled 2011-08-19 (×5): qty 1

## 2011-08-19 MED ORDER — CARVEDILOL 12.5 MG PO TABS
12.5000 mg | ORAL_TABLET | Freq: Two times a day (BID) | ORAL | Status: DC
  Administered 2011-08-20: 12.5 mg via ORAL
  Filled 2011-08-19 (×3): qty 1

## 2011-08-19 MED ORDER — ASPIRIN 81 MG PO CHEW: 324.0000 mg | CHEWABLE_TABLET | ORAL | Status: DC

## 2011-08-19 MED ORDER — SODIUM CHLORIDE 0.9 % IV SOLN
INTRAVENOUS | Status: DC
  Administered 2011-08-20: 06:00:00 via INTRAVENOUS

## 2011-08-19 MED ORDER — CLOPIDOGREL BISULFATE 75 MG PO TABS
75.0000 mg | ORAL_TABLET | Freq: Every day | ORAL | Status: DC
Start: 2011-08-20 — End: 2011-08-20
  Administered 2011-08-20: 75 mg via ORAL
  Filled 2011-08-19: qty 1

## 2011-08-19 MED ORDER — ASPIRIN 300 MG RE SUPP
300.0000 mg | RECTAL | Status: DC
  Filled 2011-08-19: qty 1

## 2011-08-19 MED ORDER — FUROSEMIDE 10 MG/ML IJ SOLN
40.0000 mg | Freq: Once | INTRAMUSCULAR | Status: AC
  Administered 2011-08-19: 40 mg via INTRAVENOUS
  Filled 2011-08-19: qty 4

## 2011-08-19 MED ORDER — INSULIN ASPART 100 UNIT/ML ~~LOC~~ SOLN: 0.0000 [IU] | Freq: Three times a day (TID) | SUBCUTANEOUS | Status: DC

## 2011-08-19 MED ORDER — DOXAZOSIN MESYLATE 8 MG PO TABS
8.0000 mg | ORAL_TABLET | Freq: Every day | ORAL | Status: DC
  Administered 2011-08-19: 8 mg via ORAL
  Filled 2011-08-19 (×2): qty 1

## 2011-08-19 MED ORDER — NITROGLYCERIN 0.4 MG SL SUBL
0.4000 mg | SUBLINGUAL_TABLET | SUBLINGUAL | Status: DC | PRN
  Filled 2011-08-19: qty 25

## 2011-08-19 NOTE — Progress Notes (Signed)
Received pt from ER awake, alert and anxious . Breathing was labored with audible wheezing. Pt denies chest pain but informed us that his lt leg was" ran over "by a tractor a his work place. He stated that the leg was degloved and that he was seen by EMS. He also stated that his daughter is a Engineer, civil (consulting) and that she takes care of the wound.We informed pt that a wound consult was done but we also need to inspect the wound.Pt was afraid for [place] to touch it at this time. We will ask again during this shift.Heparin gtt infusing at 1300 units/hr, NTG gtt at 10 mcg/min and NS at gravity, 02 @ 2 L [place].   CHG bath done and MRSA PCR collected and sent to the lab. Pt oriented to room, call bell, urinal and telephone in close reach. Pt telephone his wife and updated her on the plan of care. We will continue to monitor and care for pt.

## 2011-08-19 NOTE — ED Notes (Signed)
BP reading at 1900 inaccurate. BP cuff not on properly.

## 2011-08-19 NOTE — ED Notes (Signed)
Paged [facility] to Dr. Weldon Inches at [phone]

## 2011-08-19 NOTE — Progress Notes (Signed)
ANTICOAGULATION CONSULT NOTE - Initial Consult  Pharmacy Consult for heparin Indication: R/o acs  Allergies  Allergen Reactions  . Niacin Itching    Patient Measurements: Height: 5' 10.87" (180 cm) Weight: 215 lb (97.523 kg) IBW/kg (Calculated) : 74.99  Heparin Dosing Weight: 90kg  Vital Signs: Temp: 98.2 F (36.8 C) (08/11 1507) Temp src: Oral (08/11 1507) BP: 154/73 mmHg (08/11 1507) Pulse Rate: 87  (08/11 1507)  Labs:  Basename 08/19/11 1628 08/19/11 1606  HGB 11.9* 12.5*  HCT 35.0* 35.7*  PLT -- 214  APTT -- --  LABPROT -- --  INR -- --  HEPARINUNFRC -- --  CREATININE 1.40* --  CKTOTAL -- --  CKMB -- --  TROPONINI -- --    Estimated Creatinine Clearance: 55 ml/min (by C-G formula based on Cr of 1.4).   Medical History: Past Medical History  Diagnosis Date  . Unspecified essential hypertension   . Other and unspecified hyperlipidemia   . Benign prostatic hypertrophy     hx of  . Type II or unspecified type diabetes mellitus without mention of complication, not stated as uncontrolled   . Type II or unspecified type diabetes mellitus with neurological manifestations, not stated as uncontrolled   . Osteoarthrosis, unspecified whether generalized or localized, shoulder region   . Personal history of tobacco use, presenting hazards to health   . CAD (coronary artery disease)     NSTEMI (12/10).  LHC showed sequential 90% mid LAD stenoses and 90% stenosis at the mid to distal LAD, D1 totally occluded, OM1 subtotally occluded, 70% mid CFX, EF 50%.  CABG (1/11): LIMA LAD, SVG-D, SVG-OM, SVG-PLV.   Marland Kitchen CVA (cerebral infarction)     peri-catheterization in 12/10.  Patient initially developed double vision and MRI showed right dorsal mid-brain infarct.  Carotid dopplers with no significant disease.  Double vision has resolved.    . Diastolic heart failure     Pulmonary edema with NSTEMI.  Echo (12/10) showed EF 50-55% with mid to apical septal HK and mild mitral  regurgitation.  Echo (3/11) when admitted with CHF exacerbation: Moderate LVH,.  . Bladder cancer     s/p resection 7/11  . PVC's (premature ventricular contractions)     3 week  event monitor 8/11: no signifiant arrhythmias, occ PVCs    Medications:  Med history pending - very unclear of what medications patient currently takes  Assessment: 70 yom with known coronary disease. He had a CABG 2 years ago. He takes aspirin daily, including today. He never takes nitroglycerin. He developed shortness of breath, with discomfort in his chest while he was sitting in a chair this morning. Starting heparin for acs.  Goal of Therapy:  Heparin level 0.3-0.7 units/ml Monitor platelets by anticoagulation protocol: Yes   Plan:  Start heparin infusion at 1300 units/hr Check anti-Xa level in 8 hours and daily while on heparin Continue to monitor H&H and platelets  Severiano Gilbert 08/19/2011,5:49 PM

## 2011-08-19 NOTE — ED Provider Notes (Signed)
History     CSN: [id number]  Arrival date & time 08/19/11  1502   First MD Initiated Contact with Patient 08/19/11 1532      Chief Complaint  Patient presents with  . Shortness of Breath  . Chest Pain  . Leg Injury    Last monday from forklift injury.    (Consider location/radiation/quality/duration/timing/severity/associated sxs/prior treatment) HPI Comments: Patient presents with shortness of breath and chest tightness that started this morning. The patient reports gradual onset while sitting and tried using his wife's oxygen which did not help. He reports mild relief of his shortness of breath as time passed but still does not feel at baseline. He describes the sensation as his chest tightening and it feels difficult to get a deep breath. He admits to associated wheezing. He denies recent illness, coughing, fever. He denies any chest pain, pain or numb/tingling sensation that radiates down his left arm, and visual changes. He has a history of MI, stroke, and diastolic heart failure but no medical history of asthma or COPD.   Patient is a 77 y.o. male presenting with shortness of breath and chest pain.  Shortness of Breath  Associated symptoms include chest pain, shortness of breath and wheezing. Pertinent negatives include no fever and no cough.  Chest Pain Primary symptoms include shortness of breath and wheezing. Pertinent negatives for primary symptoms include no fever, no fatigue, no cough, no abdominal pain, no nausea, no vomiting and no dizziness.  Pertinent negatives for associated symptoms include no diaphoresis, no numbness and no weakness.     Past Medical History  Diagnosis Date  . Unspecified essential hypertension   . Other and unspecified hyperlipidemia   . Benign prostatic hypertrophy     hx of  . Type II or unspecified type diabetes mellitus without mention of complication, not stated as uncontrolled   . Type II or unspecified type diabetes mellitus with  neurological manifestations, not stated as uncontrolled   . Osteoarthrosis, unspecified whether generalized or localized, shoulder region   . Personal history of tobacco use, presenting hazards to health   . CAD (coronary artery disease)     NSTEMI (12/10).  LHC showed sequential 90% mid LAD stenoses and 90% stenosis at the mid to distal LAD, D1 totally occluded, OM1 subtotally occluded, 70% mid CFX, EF 50%.  CABG (1/11): LIMA LAD, SVG-D, SVG-OM, SVG-PLV.   Marland Kitchen CVA (cerebral infarction)     peri-catheterization in 12/10.  Patient initially developed double vision and MRI showed right dorsal mid-brain infarct.  Carotid dopplers with no significant disease.  Double vision has resolved.    . Diastolic heart failure     Pulmonary edema with NSTEMI.  Echo (12/10) showed EF 50-55% with mid to apical septal HK and mild mitral regurgitation.  Echo (3/11) when admitted with CHF exacerbation: Moderate LVH,.  . Bladder cancer     s/p resection 7/11  . PVC's (premature ventricular contractions)     3 week  event monitor 8/11: no signifiant arrhythmias, occ PVCs    Past Surgical History  Procedure Date  . Appendectomy   . Transurethral resection of prostate   . Coronary artery bypass graft 2011  . Retinal detachment surgery   . Cataract surg     Family History  Problem Relation Age of Onset  . Other      Father died at age 66 from an embolic cerebrovascular accident from a deep vein thrombosis in his lower extremity.  . Osteoarthritis Sister  History  Substance Use Topics  . Smoking status: Former Smoker -- 50 years    Quit date: 04/03/1990  . Smokeless tobacco: Not on file  . Alcohol Use: Yes     occasionally      Review of Systems  Constitutional: Negative for fever, chills, diaphoresis and fatigue.  Respiratory: Positive for chest tightness, shortness of breath and wheezing. Negative for cough and choking.   Cardiovascular: Positive for chest pain and leg swelling.    Gastrointestinal: Negative for nausea, vomiting, abdominal pain, diarrhea, constipation and abdominal distention.  Genitourinary: Negative for dysuria.  Musculoskeletal: Positive for joint swelling.  Skin: Negative for rash.  Neurological: Negative for dizziness, weakness, light-headedness, numbness and headaches.    Allergies  Niacin  Home Medications   Current Outpatient Rx  Name Route Sig Dispense Refill  . ACETAMINOPHEN 325 MG PO TABS Oral Take 325 mg by mouth as needed.      Marland Kitchen VITAMIN C 500 MG PO TABS Oral Take 500 mg by mouth daily.      . ASPIRIN 325 MG PO TABS Oral Take 325 mg by mouth daily.      . ATORVASTATIN CALCIUM 80 MG PO TABS Oral Take 1 tablet (80 mg total) by mouth daily. 90 tablet 3  . BD PEN NEEDLE MINI U/F 31G X 5 MM MISC      . CARVEDILOL 12.5 MG PO TABS Oral Take 1 tablet (12.5 mg total) by mouth 2 (two) times daily with a meal. 120 tablet 3  . DOXAZOSIN MESYLATE 8 MG PO TABS Oral Take 1 tablet (8 mg total) by mouth at bedtime. 90 tablet 3  . VITAMIN D2 400 UNITS PO TABS Oral Take 400 Units by mouth daily.      . OMEGA-3 FATTY ACIDS 1000 MG PO CAPS Oral Take 1 g by mouth daily.      . FUROSEMIDE 40 MG PO TABS  40 mg in the morning and 20 mg in the evening     . GLIPIZIDE 10 MG PO TABS Oral Take 10 mg by mouth daily.      . INSULIN GLARGINE 100 UNIT/ML [place] SOLN Subcutaneous Inject 35 Units into the skin daily.     Marland Kitchen LISINOPRIL 20 MG PO TABS Oral Take 1 tablet (20 mg total) by mouth daily. 90 tablet 3  . METFORMIN HCL 500 MG PO TABS Oral Take 500 mg by mouth 2 (two) times daily with a meal. Take 2 tablets    . NABUMETONE 500 MG PO TABS Oral Take 500 mg by mouth 2 (two) times daily.      Marland Kitchen NITROGLYCERIN 0.4 MG SL SUBL Sublingual Place 1 tablet (0.4 mg total) under the tongue every 5 (five) minutes as needed. 25 tablet 2  . POTASSIUM CHLORIDE CRYS ER 20 MEQ PO TBCR Oral Take 3 tablets (60 mEq total) by mouth daily. 270 tablet 0  . ROSUVASTATIN CALCIUM 40 MG PO TABS  Oral Take 1 tablet (40 mg total) by mouth daily. 90 tablet 3  . TAMSULOSIN HCL 0.4 MG PO CAPS Oral Take 0.4 mg by mouth.      Marland Kitchen VITAMIN E 400 UNITS PO CAPS Oral Take 400 Units by mouth daily.        BP 154/73  Pulse 87  Temp 98.2 F (36.8 C) (Oral)  Resp 18  SpO2 93%  Physical Exam  Nursing note and vitals reviewed. Constitutional: He is oriented to person, place, and time. He appears well-developed and well-nourished. No  distress.  HENT:  Head: Normocephalic and atraumatic.  Eyes: Conjunctivae are normal. No scleral icterus.  Neck: Normal range of motion.  Cardiovascular: Normal rate and regular rhythm.  Exam reveals no gallop and no friction rub.   No murmur heard. Pulmonary/Chest: He has wheezes. He has no rales. He exhibits no tenderness.       Patient is able to speak in full sentences but seems to have mildly distressed breathing.   Abdominal: Soft. There is no tenderness. There is no rebound.  Musculoskeletal: Normal range of motion.       Bandaged lower left leg due to previous injury with associated edema.   Neurological: He is alert and oriented to person, place, and time.  Skin: Skin is warm and dry. He is not diaphoretic.  Psychiatric: He has a normal mood and affect. His behavior is normal.    ED Course  Procedures (including critical care time)   Labs Reviewed  PRO B NATRIURETIC PEPTIDE  CBC WITH DIFFERENTIAL   No results found.   No diagnosis found.    MDM  4:25 PM Patient reports shortness of breath and wheezing. I will order an albuterol nebulizer to alleviate his breathing symptoms.   4:57 PM Troponin is elevated at 0.56. Dr. Weldon Inches will call Dr. Sherlie Ban, the patient's cardiologist, for his admission.       Emilia Beck, [place] 08/23/11 [phone]

## 2011-08-19 NOTE — ED Provider Notes (Signed)
Medical screening examination/treatment/procedure(s) were conducted as a shared visit with non-physician practitioner(s) and myself.  I personally evaluated the patient during the encounter He has known coronary disease.  He had a CABG 2 years ago.  He takes aspirin daily, including today.  He never takes nitroglycerin.  He developed shortness of breath, with discomfort in his chest while he was sitting in a chair.  This morning.  The chest discomfort lasted "all day" however, it is resolved.  Now. pe normal except obesity. ecg non stemi or dysrrhythmia. Trop increased Discussed with [place] cards.  He will come admit  ntg and heparin gtts started in ed.  CRITICAL CARE Performed by: Nicholes Stairs   Total critical care time: 30 min  Critical care time was exclusive of separately billable procedures and treating other patients.  Critical care was necessary to treat or prevent imminent or life-threatening deterioration.  Critical care was time spent personally by me on the following activities: development of treatment plan with patient and/or surrogate as well as nursing, discussions with consultants, evaluation of patient's response to treatment, examination of patient, obtaining history from patient or surrogate, ordering and performing treatments and interventions, ordering and review of laboratory studies, ordering and review of radiographic studies, pulse oximetry and re-evaluation of patient's condition.  Cheri Guppy, MD 08/19/11 [phone]

## 2011-08-19 NOTE — ED Notes (Signed)
PT is here with shortness of breath and chest tightness and has left leg pain from injury done last Monday.  PT has some expiratory wheezes.  Hx of bypass

## 2011-08-19 NOTE — Progress Notes (Signed)
MD on unit and was informed of CK MB 7.5 and Troponin 2.33

## 2011-08-19 NOTE — H&P (Addendum)
History and Physical  Patient ID: IRENE MITCHAM MRN: [id number], SOB: Jul 28, 1934 77 y.o. Date of Encounter: 08/19/2011, 6:49 PM  Primary Physician: Benita Stabile, MD Primary Cardiologist: [facility]  Chief Complaint: chest pressure  HPI: 77 y.o. male w/ PMHx significant for CAD s/p MI 12/10 and subsequent 4 vessel CABG (L to LAD, SVG to D, OM, PL), h/o CVA and HFpEF who presented to [facility] on 08/19/2011 with complaints of chest pressure and shortness of breath. Has had chest pressure associated with shortness of breath throughout the day. Been occurring all week intermittently, worsening today to constant which prompted the visit to the ER. Symptoms have been exertionally related.     Prior to a week ago, has been completely asymptomatic from a cardiac standpoint. However, a week ago at work was hit by a fork lift in the leg causing significant but superficial wounds which require dressing changes. Has been weeping.  Does not monitor weight. Dyspneic at rest currently. Wheezing. Denies orthopnea.  Reports being compliant with all of his medication.  EKG revealed NSR with no acute ST changes. CXR was without acute cardiopulmonary abnormalities. Labs are significant for elevated troponin and BNP.   Past Medical History  Diagnosis Date  . Unspecified essential hypertension   . Other and unspecified hyperlipidemia   . Benign prostatic hypertrophy     hx of  . Type II or unspecified type diabetes mellitus without mention of complication, not stated as uncontrolled   . Type II or unspecified type diabetes mellitus with neurological manifestations, not stated as uncontrolled   . Osteoarthrosis, unspecified whether generalized or localized, shoulder region   . Personal history of tobacco use, presenting hazards to health   . CAD (coronary artery disease)     NSTEMI (12/10).  LHC showed sequential 90% mid LAD stenoses and 90% stenosis at the mid to distal LAD, D1 totally  occluded, OM1 subtotally occluded, 70% mid CFX, EF 50%.  CABG (1/11): LIMA LAD, SVG-D, SVG-OM, SVG-PLV.   Marland Kitchen CVA (cerebral infarction)     peri-catheterization in 12/10.  Patient initially developed double vision and MRI showed right dorsal mid-brain infarct.  Carotid dopplers with no significant disease.  Double vision has resolved.    . Diastolic heart failure     Pulmonary edema with NSTEMI.  Echo (12/10) showed EF 50-55% with mid to apical septal HK and mild mitral regurgitation.  Echo (3/11) when admitted with CHF exacerbation: Moderate LVH,.  . Bladder cancer     s/p resection 7/11  . PVC's (premature ventricular contractions)     3 week  event monitor 8/11: no signifiant arrhythmias, occ PVCs     Surgical History:  Past Surgical History  Procedure Date  . Appendectomy   . Transurethral resection of prostate   . Coronary artery bypass graft 2011  . Retinal detachment surgery   . Cataract surg      Home Meds: Prior to Admission medications   Medication Sig Start Date End Date Taking? Authorizing Provider  amLODipine (NORVASC) 10 MG tablet Take 10 mg by mouth daily.   Yes Historical Provider, MD  aspirin 325 MG tablet Take 325 mg by mouth daily as needed. For pain   Yes Historical Provider, MD  aspirin EC 81 MG tablet Take 81 mg by mouth daily.   Yes Historical Provider, MD  carvedilol (COREG) 12.5 MG tablet Take 12.5 mg by mouth 2 (two) times daily with a meal.   Yes Historical Provider, MD  cephALEXin (KEFLEX)  500 MG capsule Take 500 mg by mouth every 6 (six) hours.   Yes Historical Provider, MD  doxazosin (CARDURA) 8 MG tablet Take 1 tablet (8 mg total) by mouth at bedtime. 08/24/10  Yes Laurey Morale, MD  glipiZIDE (GLUCOTROL) 5 MG tablet Take 5 mg by mouth daily.   Yes Historical Provider, MD  insulin glargine (LANTUS) 100 UNIT/ML injection Inject 40 Units into the skin at bedtime.    Yes Historical Provider, MD  metFORMIN (GLUCOPHAGE) 500 MG tablet Take 500 mg by mouth 2  (two) times daily with a meal.    Yes Historical Provider, MD  naproxen (NAPROSYN) 375 MG tablet Take 375 mg by mouth every 12 (twelve) hours.   Yes Historical Provider, MD  nitroGLYCERIN (NITROSTAT) 0.4 MG SL tablet Place 0.4 mg under the tongue every 5 (five) minutes as needed. For chest pain 08/24/10  Yes Laurey Morale, MD  omega-3 acid ethyl esters (LOVAZA) 1 G capsule Take 1 g by mouth daily.   Yes Historical Provider, MD  potassium chloride SA (K-DUR,KLOR-CON) 20 MEQ tablet Take 20-40 mEq by mouth 2 (two) times daily. Take 2 tablets in the morning and 1 tablet in the afternoon. 04/26/11  Yes Laurey Morale, MD  traMADol (ULTRAM) 50 MG tablet Take 50 mg by mouth every 6 (six) hours as needed. For pain   Yes Historical Provider, MD  vitamin B-12 (CYANOCOBALAMIN) 50 MCG tablet Take 50 mcg by mouth daily.   Yes Historical Provider, MD    Allergies:  Allergies  Allergen Reactions  . Niacin Itching    History   Social History  . Marital Status: Married    Spouse Name: N/A    Number of Children: 3  . Years of Education: N/A   Occupational History  . security guard    Social History Main Topics  . Smoking status: Former Smoker -- 50 years    Quit date: 04/03/1990  . Smokeless tobacco: Not on file  . Alcohol Use: Yes     occasionally  . Drug Use: No  . Sexually Active: Not on file   Other Topics Concern  . Not on file   Social History Narrative   He lives with his wife and his grandson.     Family History  Problem Relation Age of Onset  . Other      Father died at age 77 from an embolic cerebrovascular accident from a deep vein thrombosis in his lower extremity.  . Osteoarthritis Sister     Review of Systems General: negative for chills, fever, night sweats or weight changes.  Cardiovascular: see HPI. Dermatological: negative for rash. Injury to calf requiring dressing changes. Respiratory: + exertional SOB. Urologic: negative for hematuria. On BPH  meds. Abdominal: negative for nausea, vomiting, diarrhea, bright red blood per rectum, melena, or hematemesis Neurologic: negative for visual changes, syncope, or dizziness All other systems reviewed and are otherwise negative except as noted above.  Labs:   Lab Results  Component Value Date   WBC 10.5 08/19/2011   HGB 11.9* 08/19/2011   HCT 35.0* 08/19/2011   MCV 85.2 08/19/2011   PLT 214 08/19/2011    Lab 08/19/11 1628  NA 137  K 4.5  CL 107  CO2 --  BUN 24*  CREATININE 1.40*  CALCIUM --  PROT --  BILITOT --  ALKPHOS --  ALT --  AST --  GLUCOSE 122*   No results found for this basename: CKTOTAL:4,CKMB:4,TROPONINI:4 in the last 72  hours Lab Results  Component Value Date   CHOL 122 11/24/2010   HDL 41.20 11/24/2010   LDLCALC 51 11/24/2010   TRIG 147.0 11/24/2010   No results found for this basename: DDIMER   BNP > 6000  Radiology/Studies:  Dg Chest 2 View  08/19/2011  *RADIOLOGY REPORT*  Clinical Data: Shortness of breath.  Chest tightness.  Left leg injury.  CHEST - 2 VIEW  Comparison: 02/15/2010  Findings: Prior CABG noted with linear confluent opacities medially at both lung bases.  No overt edema.  No pleural effusion identified.  Faint interstitial accentuation noted.  IMPRESSION:  1.  Confluent linear opacities medially at the lung bases with very faint interstitial accentuation.  The appearance could represent early atypical pneumonia or bronchopneumonia.  Original Report Authenticated By: Dellia Cloud, M.D.     EKG: sinus, no acute changes. Unchanged from 02/2009.  Physical Exam: Blood pressure 154/73, pulse 87, temperature 98.2 F (36.8 C), temperature source Oral, resp. rate 18, height 5' 10.87" (1.8 m), weight 97.523 kg (215 lb), SpO2 93.00%. General: Obese, well nourished, mildly dyspneic. Head: Normocephalic, atraumatic, sclera non-icteric, nares are without discharge Neck: Supple. Negative for carotid bruits. JVD to jaw. Lungs: Mild crackles at  bases bil. Heart: RRR with S1 S2. No murmurs, rubs, or gallops appreciated. Abdomen: Soft, non-tender, non-distended with normoactive bowel sounds. No rebound/guarding. No obvious abdominal masses. Msk:  Strength and tone appear normal for age. Extremities: LLE wrapped. +1 edema bil. Distal pedal pulses are 2+ and equal bilaterally. Neuro: Alert and oriented X 3. Moves all extremities spontaneously. Psych:  Responds to questions appropriately with a normal affect.    ASSESSMENT AND PLAN:  77 y.o. male w/ PMHx significant for CAD s/p MI 12/10 and subsequent 4 vessel CABG (L to LAD, SVG to D, OM, PL), h/o CVA and HFpEF presenting with a NSTEMI (sxs and +biomarkers).  Problem List: 1. NSTEMI 2. CAD s/p CABG 3. HFpEF: Acute on chonic diastolic heart failure. 4. DM 5. HTN 6. Superficial leg wound. 7. Obesity 8. Likely undiagnosed OSA. 9. Mild renal insufficiency 10. Acute respiratory failure  Plan: Admit to step down with [facility] protocol (aspirin, statin, BB, heparin, plavix (avoid prasugrel due to h/o CVA)). Anticipate cath in the AM with graft study and possible stent placement. Due to elevated blood pressure and fluid overload and intermittent symptoms, will start nitro gtt. Fluid overload by symptoms, exam and BNP. Will diuresis and monitor renal function closely (dye load). Outpatient sleep study likely warranted. Cover DM with sliding scale. Hold metformin. Wound care consult for leg wound.  Signed, Adolm Joseph, Troy Sine MD 08/19/2011, 6:49 PM

## 2011-08-20 ENCOUNTER — Encounter (HOSPITAL_COMMUNITY)
Admission: EM | Disposition: A | Discharge status: Home Health | Payer: Self-pay | Source: Home / Self Care | Attending: Cardiology

## 2011-08-20 DIAGNOSIS — I2510 Atherosclerotic heart disease of native coronary artery without angina pectoris: Secondary | ICD-10-CM

## 2011-08-20 HISTORY — PX: LEFT HEART CATHETERIZATION WITH CORONARY ANGIOGRAM: SHX5451

## 2011-08-20 LAB — GLUCOSE, CAPILLARY
Glucose-Capillary: 225 mg/dL — ABNORMAL HIGH (ref 70–99)
Glucose-Capillary: 83 mg/dL (ref 70–99)

## 2011-08-20 LAB — CARDIAC PANEL(CRET KIN+CKTOT+MB+TROPI)
CK, MB: 6.2 ng/mL (ref 0.3–4.0)
Relative Index: 5.3 — ABNORMAL HIGH (ref 0.0–2.5)
Total CK: 133 U/L (ref 7–232)
Total CK: 118 U/L (ref 7–232)

## 2011-08-20 LAB — CBC
HCT: 30.4 % — ABNORMAL LOW (ref 39.0–52.0)
Hemoglobin: 10.3 g/dL — ABNORMAL LOW (ref 13.0–17.0)
MCHC: 33.9 g/dL (ref 30.0–36.0)
MCV: 84.4 fL (ref 78.0–100.0)
Platelets: 205 10*3/uL (ref 150–400)
RDW: 14.2 % (ref 11.5–15.5)

## 2011-08-20 LAB — HEPARIN LEVEL (UNFRACTIONATED): Heparin Unfractionated: <0.1 IU/mL — ABNORMAL LOW (ref 0.30–0.70)

## 2011-08-20 LAB — POCT ACTIVATED CLOTTING TIME: Activated Clotting Time: 100 seconds

## 2011-08-20 LAB — LIPID PANEL: LDL Cholesterol: 142 mg/dL — ABNORMAL HIGH (ref 0–99)

## 2011-08-20 SURGERY — LEFT HEART CATHETERIZATION WITH CORONARY ANGIOGRAM
Anesthesia: LOCAL

## 2011-08-20 MED ORDER — FENTANYL CITRATE 0.05 MG/ML IJ SOLN
INTRAMUSCULAR | Status: AC
  Filled 2011-08-20: qty 2

## 2011-08-20 MED ORDER — ATORVASTATIN CALCIUM 80 MG PO TABS
80.0000 mg | ORAL_TABLET | Freq: Every day | ORAL | Status: DC
  Administered 2011-08-20 – 2011-08-22 (×3): 80 mg via ORAL
  Filled 2011-08-20 (×4): qty 1

## 2011-08-20 MED ORDER — INSULIN ASPART 100 UNIT/ML ~~LOC~~ SOLN
0.0000 [IU] | Freq: Three times a day (TID) | SUBCUTANEOUS | Status: DC
  Administered 2011-08-20: 5 [IU] via SUBCUTANEOUS
  Administered 2011-08-21 – 2011-08-22 (×2): 3 [IU] via SUBCUTANEOUS

## 2011-08-20 MED ORDER — CARVEDILOL 12.5 MG PO TABS
12.5000 mg | ORAL_TABLET | Freq: Two times a day (BID) | ORAL | Status: DC
  Administered 2011-08-20 – 2011-08-23 (×6): 12.5 mg via ORAL
  Filled 2011-08-20 (×8): qty 1

## 2011-08-20 MED ORDER — TRAMADOL HCL 50 MG PO TABS
50.0000 mg | ORAL_TABLET | Freq: Four times a day (QID) | ORAL | Status: DC | PRN
  Administered 2011-08-21 – 2011-08-22 (×3): 50 mg via ORAL
  Filled 2011-08-20 (×3): qty 1

## 2011-08-20 MED ORDER — HEPARIN (PORCINE) IN NACL 2-0.9 UNIT/ML-% IJ SOLN
INTRAMUSCULAR | Status: AC
  Filled 2011-08-20: qty 2000

## 2011-08-20 MED ORDER — INSULIN GLARGINE 100 UNIT/ML ~~LOC~~ SOLN
40.0000 [IU] | Freq: Every day | SUBCUTANEOUS | Status: DC
  Administered 2011-08-20 – 2011-08-21 (×2): 40 [IU] via SUBCUTANEOUS

## 2011-08-20 MED ORDER — DOXAZOSIN MESYLATE 8 MG PO TABS
8.0000 mg | ORAL_TABLET | Freq: Every day | ORAL | Status: DC
  Administered 2011-08-20 – 2011-08-22 (×3): 8 mg via ORAL
  Filled 2011-08-20 (×4): qty 1

## 2011-08-20 MED ORDER — ONDANSETRON HCL 4 MG/2ML IJ SOLN: 4.0000 mg | Freq: Four times a day (QID) | INTRAMUSCULAR | Status: DC | PRN

## 2011-08-20 MED ORDER — MIDAZOLAM HCL 2 MG/2ML IJ SOLN
INTRAMUSCULAR | Status: AC
  Filled 2011-08-20: qty 2

## 2011-08-20 MED ORDER — POTASSIUM CHLORIDE CRYS ER 20 MEQ PO TBCR
60.0000 meq | EXTENDED_RELEASE_TABLET | Freq: Every day | ORAL | Status: DC
  Filled 2011-08-20: qty 3

## 2011-08-20 MED ORDER — NITROGLYCERIN 0.2 MG/ML ON CALL CATH LAB
INTRAVENOUS | Status: AC
  Filled 2011-08-20: qty 1

## 2011-08-20 MED ORDER — AMLODIPINE BESYLATE 10 MG PO TABS
10.0000 mg | ORAL_TABLET | Freq: Every day | ORAL | Status: DC
  Administered 2011-08-21 – 2011-08-23 (×3): 10 mg via ORAL
  Filled 2011-08-20 (×3): qty 1

## 2011-08-20 MED ORDER — LIDOCAINE HCL (PF) 1 % IJ SOLN
INTRAMUSCULAR | Status: AC
  Filled 2011-08-20: qty 30

## 2011-08-20 MED ORDER — ACETAMINOPHEN 325 MG PO TABS
650.0000 mg | ORAL_TABLET | ORAL | Status: DC | PRN
  Administered 2011-08-21: 650 mg via ORAL
  Filled 2011-08-20: qty 2

## 2011-08-20 MED ORDER — METFORMIN HCL 500 MG PO TABS
500.0000 mg | ORAL_TABLET | Freq: Two times a day (BID) | ORAL | Status: DC
  Administered 2011-08-21 – 2011-08-22 (×3): 500 mg via ORAL
  Filled 2011-08-20 (×5): qty 1

## 2011-08-20 MED ORDER — SODIUM CHLORIDE 0.9 % IV SOLN: INTRAVENOUS | Status: AC

## 2011-08-20 MED ORDER — ACETAMINOPHEN 325 MG PO TABS
ORAL_TABLET | ORAL | Status: AC
  Filled 2011-08-20: qty 2

## 2011-08-20 MED ORDER — HEPARIN (PORCINE) IN NACL 100-0.45 UNIT/ML-% IJ SOLN
1600.0000 [IU]/h | INTRAMUSCULAR | Status: DC
  Administered 2011-08-20: 1600 [IU]/h via INTRAVENOUS
  Filled 2011-08-20 (×2): qty 250

## 2011-08-20 MED ORDER — GLIPIZIDE 5 MG PO TABS
5.0000 mg | ORAL_TABLET | Freq: Every day | ORAL | Status: DC
  Administered 2011-08-21 – 2011-08-23 (×3): 5 mg via ORAL
  Filled 2011-08-20 (×4): qty 1

## 2011-08-20 MED ORDER — ASPIRIN EC 81 MG PO TBEC
81.0000 mg | DELAYED_RELEASE_TABLET | Freq: Every day | ORAL | Status: DC
  Administered 2011-08-21 – 2011-08-23 (×3): 81 mg via ORAL
  Filled 2011-08-20 (×3): qty 1

## 2011-08-20 MED ORDER — CEPHALEXIN 500 MG PO CAPS
500.0000 mg | ORAL_CAPSULE | Freq: Four times a day (QID) | ORAL | Status: DC
  Administered 2011-08-20 – 2011-08-23 (×12): 500 mg via ORAL
  Filled 2011-08-20 (×15): qty 1

## 2011-08-20 NOTE — Progress Notes (Signed)
To cath lab by bed stable.

## 2011-08-20 NOTE — Progress Notes (Deleted)
Pt is in the cath. Lab .

## 2011-08-20 NOTE — Interval H&P Note (Signed)
History and Physical Interval Note:  08/20/2011 1:34 PM  Joshua Vincent  has presented today for surgery, with the diagnosis of cp  The various methods of treatment have been discussed with the patient and family. After consideration of risks, benefits and other options for treatment, the patient has consented to  Procedure(s) (LRB): LEFT HEART CATHETERIZATION WITH CORONARY ANGIOGRAM (N/A) as a surgical intervention .  The patient's history has been reviewed, patient examined, no change in status, stable for surgery.  I have reviewed the patient's chart and labs.  Questions were answered to the patient's satisfaction.     Elyn Aquas.

## 2011-08-20 NOTE — Progress Notes (Signed)
ANTICOAGULATION CONSULT NOTE   Pharmacy Consult for heparin Indication: R/o acs  Allergies  Allergen Reactions  . Niacin Itching    Patient Measurements: Height: 6' (182.9 cm) Weight: 230 lb 13.2 oz (104.7 kg) IBW/kg (Calculated) : 77.6  Heparin Dosing Weight: 90kg  Vital Signs: Temp: 97.9 F (36.6 C) (08/12 0700) Temp src: Oral (08/12 0700) BP: 130/61 mmHg (08/12 0800) Pulse Rate: 80  (08/12 0700)  Labs:  Alvira Philips 08/20/11 0815 08/20/11 0240 08/20/11 0125 08/19/11 2201 08/19/11 1628 08/19/11 1606  HGB -- -- 10.3* -- 11.9* --  HCT -- -- 30.4* -- 35.0* 35.7*  PLT -- -- 205 -- -- 214  APTT -- -- -- -- -- --  LABPROT -- -- -- -- -- --  INR -- -- -- -- -- --  HEPARINUNFRC <0.10* -- -- -- -- --  CREATININE -- -- -- -- 1.40* --  CKTOTAL -- 133 -- 135 -- --  CKMB -- 7.0* -- 7.5* -- --  TROPONINI -- 1.68* -- 2.33* -- --    Estimated Creatinine Clearance: 57.9 ml/min (by C-G formula based on Cr of 1.4).   Assessment: 89 yom with known coronary disease. He had a CABG 2 years ago. He developed shortness of breath, with discomfort in his chest and is on heparin for ACS. The heparin level this am is undetectable and no heparin interruptions noted. patient is going to cath later today  Goal of Therapy:  Heparin level 0.3-0.7 units/ml Monitor platelets by anticoagulation protocol: Yes   Plan:  -Increase heparin to 1600 units/hr -Will follow post cath  Harland German, Pharm D 08/20/2011 9:28 AM

## 2011-08-20 NOTE — Consult Note (Signed)
WOC consult Note Reason for Consult: Consult requested for left leg wound.  Pt states man ran into his leg with a forklift on Sunday and he was seen in urgent care center for topical treatment.  That dressing has remained in place since that time. Wound type: Full thickness traumatic injury to left calf. Pressure Ulcer POA: No Measurement: Left ankle without open wound.  Generalized edema and swelling.  Left posterior calf 6X5X.1cm, 90% red, 10% yellow, mod yellow drainage, no odor.  Anterior calf approx 20X20X.1cm.  80% red, 10% yellow, 10% dark purple old clotted blood in 2 areas.  Each of these is approx 1X1cm to outer area of leg; pt states this was the point of impact from a rod on the forklift.  These 2 sites could evolve into eschar and must remain moist to assist with removal of nonviable tissue. Dressing procedure/placement/frequency: Foam dressing to protect and absorb drainage.  Vaseline gauze to provide moisture to old clotted bloody areas.  Will not plan to follow further unless re-consulted.  [address], RN, MSN, [employer]  [phone]

## 2011-08-20 NOTE — CV Procedure (Signed)
    Cardiac Cath Note  Joshua Vincent [id number] July 15, 1934  Procedure: Left Heart Cardiac Catheterization Note Indications: Pt with chest pain, + car  Procedure Details Consent: Obtained Time Out: Verified patient identification, verified procedure, site/side was marked, verified correct patient position, special equipment/implants available, Radiology Safety Procedures followed,  medications/allergies/relevent history reviewed, required imaging and test results available.  Performed   Medications: Fentanyl: 50 mcg IV Versed: 2 mg IV  The right femoral artery was easily canulated using a modified Seldinger technique.  Hemodynamics:    LV pressure: 136/11 Aortic pressure: 132/67  Angiography   Left Main:  normal  Left anterior Descending: Occluded proximally, there are right to left collaterals that fill the proximal and mid septal branches.    Left Circumflex: subtotally occluded proximally with sluggish flow down a small OM.    Right Coronary Artery: prox is normal.  Mid- moderate disease 40%, distal - mild disease.  The PDA has a 80-90% stenosis at it's mid point .  There is a 90% long stenosis in the proximal PLSA.  SVG to RCA:  ( JR4 catheter) Occluded proximally  SVG to Diagonal:  (Left coronary bypass catheter) Occluded proximally  SVG to OM:  ( Left coronary bypass catheter)  The graft is large.  There are moderate irregularities in the mid OM.  LIMA to LAD:  Large graft, patent anastomosis.  The LAD has only minor luminal irregularities   LV Gram: anterior akinesis,  The LV function is mildly depressed.  EF 40%  Complications: No apparent complications Patient did tolerate procedure well.  Contrast used:  120 cc  Conclusions:   1. Significant CAD involving all 3 coronaries. 2. Occluded SVG to RCA and SVG to Diag.  The SVG to OM and the LIMA to LAD is patent 3. Mildly reduced LV function with anterior akinesis.  He has no stenosis that are amenable to  PCI.  Will continue with medical therapy.  Joshua Vincent, Joshua Vincent., MD, [facility] 08/20/2011, 1:36 PM Office - [phone] Pager [phone]

## 2011-08-20 NOTE — Care Management Note (Addendum)
    Page 1 of 2   08/22/2011     9:33:26 AM   CARE MANAGEMENT NOTE 08/22/2011  Patient:  Joshua Vincent, Joshua Vincent   Account Number:  [id number]  Date Initiated:  08/20/2011  Documentation initiated by:  Junius Creamer  Subjective/Objective Assessment:   adm w mi     Action/Plan:   lives w wife, pcp dr Derry Skill, has leg wound from being hit by forklift   Anticipated DC Date:  08/22/2011   Anticipated DC Plan:  HOME W HOME HEALTH SERVICES      DC Planning Services  CM consult      [facility] Choice  DURABLE MEDICAL EQUIPMENT  HOME HEALTH   Choice offered to / List presented to:  C-1 Patient   DME arranged  [facility]      DME agency  [facility]     HH arranged  HH-1 RN  HH-2 PT      [facility] agency  [facility]   Status of service:   Medicare Important Message given?   (If response is "NO", the following Medicare IM given date fields will be blank) Date Medicare IM given:   Date Additional Medicare IM given:    Discharge Disposition:  HOME W HOME HEALTH SERVICES  Per UR Regulation:  Reviewed for med. necessity/level of care/duration of stay  If discussed at Long Length of Stay Meetings, dates discussed:    Comments:  8/14 9:29a debbie Izabel Chim rn,bsn spoke w [facility] nse and unit nse. sats dec to 87%, needs wound care for leg and short of breath w ambul and could benefit from hhpt. gave pt hhc list and would like to use [facility]. ref to [facility] for o2 and hhc. will await orders and face to face from md. copy of hhc agency list on chart.   8/12 10:46a debbie Veronnica Hennings rn,bsn [phone]

## 2011-08-20 NOTE — Progress Notes (Signed)
Nutrition Brief Note  Patient identified on the Nutrition Risk Report for unintentional weight loss. Per notes pt does not weight himself regularly. Per weight hx, pt with weight gain from last year.  Wt Readings from Last 5 Encounters:  08/19/11 230 lb 13.2 oz (104.7 kg)  08/19/11 230 lb 13.2 oz (104.7 kg)  06/23/10 202 lb (91.627 kg)  04/17/10 206 lb 12.8 oz (93.804 kg)  01/05/10 191 lb 12 oz (86.977 kg)  Pt reports weight gain as well.    Body mass index is 31.31 kg/(m^2). Pt meets criteria for Obesity class 1 based on current BMI.   Current diet order is NPO. Labs and medications reviewed.   No nutrition interventions warranted at this time. If nutrition issues arise, please re-consult RD.   Clarene Duke RD, LDN Pager [phone] After Hours pager [phone]

## 2011-08-21 ENCOUNTER — Inpatient Hospital Stay (HOSPITAL_COMMUNITY): Payer: Medicare Other

## 2011-08-21 DIAGNOSIS — I214 Non-ST elevation (NSTEMI) myocardial infarction: Secondary | ICD-10-CM

## 2011-08-21 DIAGNOSIS — D649 Anemia, unspecified: Secondary | ICD-10-CM

## 2011-08-21 LAB — CBC
MCV: 85.1 fL (ref 78.0–100.0)
Platelets: 219 10*3/uL (ref 150–400)
RBC: 3.75 MIL/uL — ABNORMAL LOW (ref 4.22–5.81)
RDW: 14.2 % (ref 11.5–15.5)
WBC: 6.5 10*3/uL (ref 4.0–10.5)

## 2011-08-21 LAB — VITAMIN B12: Vitamin B-12: 415 pg/mL (ref 211–911)

## 2011-08-21 LAB — RETICULOCYTES
Retic Count, Absolute: 103.3 10*3/uL (ref 19.0–186.0)
Retic Ct Pct: 2.5 % (ref 0.4–3.1)

## 2011-08-21 LAB — IRON AND TIBC
Iron: 28 ug/dL — ABNORMAL LOW (ref 42–135)
UIBC: 243 ug/dL (ref 125–400)

## 2011-08-21 LAB — GLUCOSE, CAPILLARY
Glucose-Capillary: 168 mg/dL — ABNORMAL HIGH (ref 70–99)
Glucose-Capillary: 222 mg/dL — ABNORMAL HIGH (ref 70–99)

## 2011-08-21 MED ORDER — POTASSIUM CHLORIDE CRYS ER 20 MEQ PO TBCR
20.0000 meq | EXTENDED_RELEASE_TABLET | Freq: Every day | ORAL | Status: DC
  Administered 2011-08-21 – 2011-08-23 (×3): 20 meq via ORAL
  Filled 2011-08-21 (×2): qty 1

## 2011-08-21 NOTE — Progress Notes (Signed)
PROGRESS NOTE  Subjective:   Pt was admitted with CP.  Cath yesterday showed severe native CAD, occluded SVG to RCA and occluded SVG to Diag.  The LIMA to LAD and the SVG to OM were patent.  His presenting symptoms were dyspnea.  His cardiac enzymes are + and c/w NSTEMI.    Objective:    Vital Signs:   Temp:  [97.6 F (36.4 C)-99.1 F (37.3 C)] 98.8 F (37.1 C) (08/13 0426) Pulse Rate:  [72-88] 85  (08/13 0400) Resp:  [16-26] 21  (08/13 0400) BP: (119-157)/(55-88) 151/71 mmHg (08/13 0400) SpO2:  [91 %-96 %] 96 % (08/13 0400) Weight:  [231 lb 0.7 oz (104.8 kg)] 231 lb 0.7 oz (104.8 kg) (08/13 0541)  Last BM Date: 08/19/11   24-hour weight change: Weight change: 16 lb 0.7 oz (7.277 kg)  Weight trends: Filed Weights   08/19/11 1700 08/19/11 2100 08/21/11 0541  Weight: 215 lb (97.523 kg) 230 lb 13.2 oz (104.7 kg) 231 lb 0.7 oz (104.8 kg)    Intake/Output:  08/12 0701 - 08/13 0700 In: 1311 [P.O.:400; I.V.:911] Out: 1150 [Urine:1150]     Physical Exam: BP 151/71  Pulse 85  Temp 98.8 F (37.1 C) (Oral)  Resp 21  Ht 6' (1.829 m)  Wt 231 lb 0.7 oz (104.8 kg)  BMI 31.33 kg/m2  SpO2 96%  General: Vital signs reviewed and noted. Well-developed, well-nourished, in no acute distress; alert, appropriate and cooperative .  Head: Normocephalic, atraumatic.  Eyes: conjunctivae/corneas clear.  EOM's intact.   Throat: normal  Neck: Supple. Normal carotids. No JVD  Lungs:  Decreased breath sounds in right lower 1/2 lung field.  Heart: Regular rate,  With normal  S1 S2. No murmurs, gallops or rubs  Abdomen:  Soft, non-tender, non-distended with normoactive bowel sounds. No hepatomegaly. No rebound/guarding. No abdominal masses.  Extremities: Distal pedal pulses are 2+ .  No edema. Left leg is bandaged.   Neurologic: A&O X3, CN II - XII are grossly intact. Motor strength is 5/5 in the all 4 extremities.  Psych: Responds to questions appropriately with normal affect.     Labs: BMET:  Basename 08/19/11 1628  NA 137  K 4.5  CL 107  CO2 --  GLUCOSE 122*  BUN 24*  CREATININE 1.40*  CALCIUM --  MG --  PHOS --    Liver function tests: No results found for this basename: AST:2,ALT:2,ALKPHOS:2,BILITOT:2,PROT:2,ALBUMIN:2 in the last 72 hours No results found for this basename: LIPASE:2,AMYLASE:2 in the last 72 hours  CBC:  Basename 08/21/11 0533 08/20/11 0125 08/19/11 1606  WBC 6.5 7.8 --  NEUTROABS -- -- 8.4*  HGB 10.9* 10.3* --  HCT 31.9* 30.4* --  MCV 85.1 84.4 --  PLT 219 205 --    Cardiac Enzymes:  Basename 08/20/11 0815 08/20/11 0240 08/19/11 2201  CKTOTAL 118 133 135  CKMB 6.2* 7.0* 7.5*  TROPONINI 1.96* 1.68* 2.33*    Coagulation Studies:  Basename 08/20/11 0815  LABPROT 13.7  INR 1.03    Other: No components found with this basename: POCBNP:3 No results found for this basename: DDIMER in the last 72 hours No results found for this basename: HGBA1C in the last 72 hours  Basename 08/20/11 0125  CHOL 205*  HDL 40  LDLCALC 142*  TRIG 115  CHOLHDL 5.1   No results found for this basename: TSH,T4TOTAL,FREET3,T3FREE,THYROIDAB in the last 72 hours No results found for this basename: VITAMINB12,FOLATE,FERRITIN,TIBC,IRON,RETICCTPCT in the last 72 hours  Tele:  NSR  Medications:    Infusions:    . sodium chloride 125 mL/hr at 08/20/11 1500  . DISCONTD: sodium chloride 50 mL/hr at 08/20/11 0700  . DISCONTD: heparin 1,300 Units/hr (08/20/11 0600)  . DISCONTD: heparin 1,600 Units/hr (08/20/11 1000)  . DISCONTD: nitroGLYCERIN 10 mcg/min (08/20/11 0600)    Scheduled Medications:    . amLODipine  10 mg Oral Daily  . aspirin EC  81 mg Oral Daily  . atorvastatin  80 mg Oral q1800  . carvedilol  12.5 mg Oral BID WC  . cephALEXin  500 mg Oral Q6H  . doxazosin  8 mg Oral QHS  . fentaNYL      . glipiZIDE  5 mg Oral QAC breakfast  . heparin      . insulin aspart  0-15 Units Subcutaneous TID WC  . insulin glargine   40 Units Subcutaneous QHS  . lidocaine      . metFORMIN  500 mg Oral BID WC  . midazolam      . nitroGLYCERIN      . potassium chloride SA  60 mEq Oral Daily  . DISCONTD: amLODipine  10 mg Oral Daily  . DISCONTD: aspirin  324 mg Oral NOW  . DISCONTD: aspirin  300 mg Rectal NOW  . DISCONTD: aspirin  325 mg Oral Daily  . DISCONTD: atorvastatin  80 mg Oral q1800  . DISCONTD: carvedilol  12.5 mg Oral BID WC  . DISCONTD: cephALEXin  500 mg Oral QID  . DISCONTD: clopidogrel  75 mg Oral Q breakfast  . DISCONTD: doxazosin  8 mg Oral QHS  . DISCONTD: glipiZIDE  5 mg Oral Daily  . DISCONTD: insulin aspart  0-15 Units Subcutaneous TID WC  . DISCONTD: insulin glargine  40 Units Subcutaneous QHS  . DISCONTD: potassium chloride SA  60 mEq Oral Daily  . DISCONTD: sodium chloride  3 mL Intravenous Q12H  . DISCONTD: sodium chloride  3 mL Intravenous Q12H    Assessment/ Plan:    DM (01/05/2009) Continue meds.  SS insulin.  Restart metformin today.  DYSLIPIDEMIA (01/05/2009) Continue atorvastatin  HYPERTENSION (01/05/2009)  well controlled  MYOCARDIAL INFARCTION, ACUTE, SUBENDOCARDIAL (01/05/2009) Likely due to his diffuse CAD.  No specific culprit lesion - continue medical therapy.  DIASTOLIC HEART FAILURE, CHRONIC (01/06/2009)  Dyspnea:  Has decreased breath sounds in right base.  Will get PA and Lat CXR.  Anemia:  His Hb has fallen from 14 to 10 since admission. Up slightly today compared to yesterday.  Will check stool for blood. Check iron studies.   Further eval will be as OP by Dr. Clovis Riley.  Disposition: ambulate, home in 1-2 days.  Length of Stay: 2  Vesta Mixer, Montez Hageman., MD, [facility] 08/21/2011, 7:11 AM Office [phone] Pager [phone]

## 2011-08-21 NOTE — Progress Notes (Signed)
CARDIAC REHAB PHASE I   PRE:  Rate/Rhythm: 80SR  BP:  Supine: 149/68  Sitting:   Standing:    SaO2: 98%4L, 95%2L,91%RA  MODE:  Ambulation: 270 ft   POST:  Rate/Rhythem: 93 SR  BP:  Supine:   Sitting: 167/72  Standing:    SaO2: 87%RA hall, 92%2L  SATURATION QUALIFICATIONS:  Patient Saturations on Room Air at Rest = 91%  Patient Saturations on Room Air while Ambulating = 87%  Patient Saturations on 2 Liters of oxygen while Ambulating = 92%  Statement of medical necessity for home oxygen: c/o SOB when off oxygen and desat with walk on RA  0930-1048 Pt was on 4L resting in bed. Decreased to 2L and sats 95%, tried RA in room and 91%.  When pt got to hall on RA, sats had dropped to 87%. Put on 2L to keep sats at 92%. Pt did c/o SOB when on RA. Documented as above if pt needs to be sent home on oxygen. Walked 270 ft with rolling walker on 2L and asst x 1. Had difficulty bearing weight on left leg at beginning of walk. Left leg sore to thigh but as he walked he said he loosened up. Stated he had walker at home if needed. Tired by end of walk but denied cp. To recliner with left leg elevated on pillow. Call bell in reach. Education completed except ex ed. Pt encouraged to weigh daily and limit sodium. Also discussed counting carbs which he had not been doing. Discussed CRP2 but pt declined. Said he tried to do after CABG but sugars were so high that his ex was limited. We will followup tomorrow to complete ed and walk.  Duanne Limerick

## 2011-08-22 LAB — BASIC METABOLIC PANEL
BUN: 23 mg/dL (ref 6–23)
CO2: 22 mEq/L (ref 19–32)
Calcium: 9 mg/dL (ref 8.4–10.5)
GFR calc non Af Amer: 56 mL/min — ABNORMAL LOW (ref >90)
Glucose, Bld: 184 mg/dL — ABNORMAL HIGH (ref 70–99)
Sodium: 136 mEq/L (ref 135–145)

## 2011-08-22 LAB — GLUCOSE, CAPILLARY
Glucose-Capillary: 143 mg/dL — ABNORMAL HIGH (ref 70–99)
Glucose-Capillary: 48 mg/dL — ABNORMAL LOW (ref 70–99)
Glucose-Capillary: 70 mg/dL (ref 70–99)

## 2011-08-22 LAB — CBC
HCT: 34 % — ABNORMAL LOW (ref 39.0–52.0)
Hemoglobin: 11.4 g/dL — ABNORMAL LOW (ref 13.0–17.0)
MCH: 28.1 pg (ref 26.0–34.0)
MCHC: 33.5 g/dL (ref 30.0–36.0)
RDW: 14.1 % (ref 11.5–15.5)

## 2011-08-22 LAB — FOLATE RBC: RBC Folate: 1439 ng/mL — ABNORMAL HIGH (ref >=366)

## 2011-08-22 MED ORDER — INSULIN GLARGINE 100 UNIT/ML ~~LOC~~ SOLN: 35.0000 [IU] | Freq: Every day | SUBCUTANEOUS | Status: DC

## 2011-08-22 MED ORDER — DEXTROSE 50 % IV SOLN
INTRAVENOUS | Status: AC
  Administered 2011-08-22: 50 mL via INTRAVENOUS
  Filled 2011-08-22: qty 50

## 2011-08-22 MED ORDER — FUROSEMIDE 10 MG/ML IJ SOLN
40.0000 mg | Freq: Once | INTRAMUSCULAR | Status: AC
  Administered 2011-08-22: 40 mg via INTRAVENOUS
  Filled 2011-08-22: qty 4

## 2011-08-22 MED ORDER — INSULIN GLARGINE 100 UNIT/ML ~~LOC~~ SOLN
30.0000 [IU] | Freq: Every day | SUBCUTANEOUS | Status: DC
  Administered 2011-08-22: 30 [IU] via SUBCUTANEOUS

## 2011-08-22 MED ORDER — DEXTROSE 50 % IV SOLN
1.0000 | Freq: Once | INTRAVENOUS | Status: AC
  Administered 2011-08-22: 50 mL via INTRAVENOUS

## 2011-08-22 NOTE — Progress Notes (Signed)
Cardiac rehab evaluation reviewed - discussed results with Dr. Elease Hashimoto including SOB & desaturation with ambulation. Patient was not on home O2 prior to admission. We will keep & attempt to diurese gently. Will check BMET today (Cr mildly elevated on admit) and if labs are stable, will rx Lasix 40mg  IV x 1 and observe for today. Obtain daily weights and measure I&Os (?if admit weight is accurate). Add incentive spirometry for possible atelectasis on CXR. Will add labs for tomorrow and reassess O2 status in AM. Consider eval for PE if hypoxia/SOB persists beyond diuresis. Will hold Metformin given post-cath, and decrease Lantus to 30 units QHS given hypoglycemia. Plan discussed with nursing and patient. Joshua Vincent was very agreeable to staying. Dayna Dunn PA-C

## 2011-08-22 NOTE — Progress Notes (Signed)
PROGRESS NOTE  Subjective:   Pt was admitted with CP.  Cath yesterday showed severe native CAD, occluded SVG to RCA and occluded SVG to Diag.  The LIMA to LAD and the SVG to OM were patent.  He developed anemia.  His Hb has started to increase ( although it is still lower than normal).  Had an episode of hypoglycemia - Lantus insulin was decreased from 40 to 35 units at night.  Ambulated without problems yesterday     Objective:    Vital Signs:   Temp:  [98.3 F (36.8 C)-99.4 F (37.4 C)] 99.4 F (37.4 C) (08/14 0335) Pulse Rate:  [72-84] 78  (08/14 0335) Resp:  [18-23] 18  (08/14 0335) BP: (142-167)/(65-86) 154/72 mmHg (08/14 0335) SpO2:  [93 %-97 %] 96 % (08/14 0335)  Last BM Date: 08/19/11   24-hour weight change: Weight change:   Weight trends: Filed Weights   08/19/11 1700 08/19/11 2100 08/21/11 0541  Weight: 215 lb (97.523 kg) 230 lb 13.2 oz (104.7 kg) 231 lb 0.7 oz (104.8 kg)    Intake/Output:  08/13 0701 - 08/14 0700 In: 1110 [P.O.:1110] Out: 1125 [Urine:1125]     Physical Exam: BP 154/72  Pulse 78  Temp 99.4 F (37.4 C) (Oral)  Resp 18  Ht 6' (1.829 m)  Wt 231 lb 0.7 oz (104.8 kg)  BMI 31.33 kg/m2  SpO2 96%  General: Vital signs reviewed and noted. Well-developed, well-nourished, in no acute distress; alert, appropriate and cooperative .  Head: Normocephalic, atraumatic.  Eyes: conjunctivae/corneas clear.  EOM's intact.   Throat: normal  Neck: Supple. Normal carotids. No JVD  Lungs:  Breath sounds are better  Heart: Regular rate,  With normal  S1 S2. No murmurs, gallops or rubs  Abdomen:  Soft, non-tender, non-distended with normoactive bowel sounds. No hepatomegaly. No rebound/guarding. No abdominal masses.  Extremities: Distal pedal pulses are 2+ .  No edema. Left leg is bandaged.   Neurologic: A&O X3, CN II - XII are grossly intact. Motor strength is 5/5 in the all 4 extremities.  Psych: Responds to questions appropriately with normal  affect.    Labs: BMET:  Basename 08/19/11 1628  NA 137  K 4.5  CL 107  CO2 --  GLUCOSE 122*  BUN 24*  CREATININE 1.40*  CALCIUM --  MG --  PHOS --    Liver function tests: No results found for this basename: AST:2,ALT:2,ALKPHOS:2,BILITOT:2,PROT:2,ALBUMIN:2 in the last 72 hours No results found for this basename: LIPASE:2,AMYLASE:2 in the last 72 hours  CBC:  Basename 08/22/11 0520 08/21/11 0533 08/19/11 1606  WBC 7.1 6.5 --  NEUTROABS -- -- 8.4*  HGB 11.4* 10.9* --  HCT 34.0* 31.9* --  MCV 84.0 85.1 --  PLT 248 219 --    Cardiac Enzymes:  Basename 08/20/11 0815 08/20/11 0240 08/19/11 2201  CKTOTAL 118 133 135  CKMB 6.2* 7.0* 7.5*  TROPONINI 1.96* 1.68* 2.33*    Coagulation Studies:  Basename 08/20/11 0815  LABPROT 13.7  INR 1.03    Other: No components found with this basename: POCBNP:3 No results found for this basename: DDIMER in the last 72 hours No results found for this basename: HGBA1C in the last 72 hours  Basename 08/20/11 0125  CHOL 205*  HDL 40  LDLCALC 142*  TRIG 115  CHOLHDL 5.1   No results found for this basename: TSH,T4TOTAL,FREET3,T3FREE,THYROIDAB in the last 72 hours  Basename 08/21/11 0837  VITAMINB12 415  FOLATE --  FERRITIN --  TIBC  271  IRON 28*  RETICCTPCT 2.5    Tele:  NSR  Medications:    Infusions:    Scheduled Medications:    . amLODipine  10 mg Oral Daily  . aspirin EC  81 mg Oral Daily  . atorvastatin  80 mg Oral q1800  . carvedilol  12.5 mg Oral BID WC  . cephALEXin  500 mg Oral Q6H  . dextrose  1 ampule Intravenous Once  . doxazosin  8 mg Oral QHS  . glipiZIDE  5 mg Oral QAC breakfast  . insulin aspart  0-15 Units Subcutaneous TID WC  . insulin glargine  40 Units Subcutaneous QHS  . metFORMIN  500 mg Oral BID WC  . potassium chloride  20 mEq Oral Daily  . DISCONTD: potassium chloride SA  60 mEq Oral Daily    Assessment/ Plan:    DM (01/05/2009) Have reduced the nighttime Lantus to 35  (from 40).  He will need to see Dr. Clovis Riley for further eval  DYSLIPIDEMIA (01/05/2009) Continue atorvastatin  HYPERTENSION (01/05/2009)  well controlled  MYOCARDIAL INFARCTION, ACUTE, SUBENDOCARDIAL (01/05/2009) Likely due to his diffuse CAD.  No specific culprit lesion - continue medical therapy.  DIASTOLIC HEART FAILURE, CHRONIC (01/06/2009)  Dyspnea:  Has decreased breath sounds in right base.  Will get PA and Lat CXR.  Anemia:  Hb has already started to increase.   Further eval will be as OP by Dr. Clovis Riley.  Disposition: ambulate, home today. Amlodipine 10 qd NTG 0.4 SL prn Asa 81 Atorvastatin 80 Coreg 12.5 bid Keflex 500 QID for 5 more days ( for leg infection, follow up with Dr. Clovis Riley) Doxazosin 8 at hs Glipizide 5 qd lantus insulin 34 hs Metformin 500 bid KCl 20 qd - will need BMP in 1 week  (should go to medical doctor or Can be in our office and directed to me)- he was on 60 meq daily but not on any diuretic.    Length of Stay: 3  Vesta Mixer, Montez Hageman., MD, [facility] 08/22/2011, 7:23 AM Office [phone] Pager [phone]

## 2011-08-22 NOTE — Progress Notes (Signed)
CARDIAC REHAB PHASE I   PRE:  Rate/Rhythm: 71SR  BP:  Supine:   Sitting: 139/64  Standing:    SaO2: 96%3L,92%RA  MODE:  Ambulation: 270 ft   POST:  Rate/Rhythem: 89  BP:  Supine:   Sitting: 159/68  Standing:    SaO2: 87%RA,90-91%2L  SATURATION QUALIFICATIONS:  Patient Saturations on Room Air at Rest = 92%  Patient Saturations on Room Air while Ambulating = 87%  Patient Saturations on 2 Liters of oxygen while Ambulating = 90-91%  Statement of medical necessity for home oxygen: SOB with walking on RA short distance and desats  [phone] Pt was on 3L when I entered room. Put on RA. Pt only walked few feet outside of door and he c/o SOB and sats at 87%.  It took 2L to keep sats at 90%. Pt continues to have significant pain in left leg when walking. Denied CP. Pt does question why he is so SOB now when he wasn't prior to MI. To recliner after walk. Pt states he does not think wife can do leg dsg changes. He also states 14 stairs to get to bedroom. Encouraged pt to sleep downstairs until leg better as 270 ft wears him out. He has sofa bed downstairs. May need home PT consult. Discussed with pt's RN pt needs. To have case manager see. Pt has walker for home. Ex ed done but pt to modify as needed for leg pain.   Duanne Limerick

## 2011-08-22 NOTE — Progress Notes (Signed)
HOME HEALTH AGENCIES SERVING [place]   Agencies that are Medicare-Certified and are affiliated with [facility]  Telephone Number Address  [facility]   [facility] has ownership interest in this company; however, you are under no obligation to use this agency. [phone] or  [phone] [address] [place], [place] [address]   Agencies that are Medicare-Certified and are not affiliated with [facility]                                                                                 Home Health Agency Telephone Number Address  [facility] [phone] Fax [phone] [address], [address] [place], [place]  [address]  [facility] [phone] or [phone] Fax [phone] [address] [place], [place] [address]  [facility] [phone] Fax [phone] [address] [place], [place] [address]  [facility] [phone] Fax [phone] [address], [address] [place], [place]  [address]  [facility] [phone] Fax [phone] [address], [address] [place], [place] [address]  [facility] [phone] [address] [place], [place] [address]  [facility] [phone]  [address] [place], [place] [address]  [facility] [phone] or [phone] Fax [phone] [address], [address] [place], [place]  [address]  [facility] [phone] Fax [phone] [address] [place], [place]  [address]  [facility]  [phone] Fax [phone] [address] [place], [place] [address]      

## 2011-08-22 NOTE — Progress Notes (Signed)
Inpatient Diabetes Program Recommendations  AACE/ADA: New Consensus Statement on Inpatient Glycemic Control (2013)  Target Ranges:  Prepandial:   less than 140 mg/dL      Peak postprandial:   less than 180 mg/dL (1-2 hours)      Critically ill patients:  140 - 180 mg/dL   Hypoglycemia this morning CBG=48.  Recommend a further reduction in Lantus dose from 35 to 30 units.    Inpatient Diabetes Program Recommendations Insulin - Basal: Decrease Lantus to 30 units  Thank you  Piedad Climes [facility] Inpatient Diabetes Coordinator [phone]

## 2011-08-22 NOTE — Progress Notes (Addendum)
CRITICAL VALUE ALERT  Critical value received: CBG = 48  Date of notification:  08/22/2011  Time of notification: 0543  Pt was soaked with sweat; stated that he felt very weak; VSS; blood sugar checked; CBG found to be 48; Hypoglycemia protocol initiated; however, 50% Dextrose was given instead of food because pt was lethargic; CBG rechecked at 0558; CBG = 143; pt now alert, talking, stated that he felt like he was almost gone; BP = 148/62

## 2011-08-23 ENCOUNTER — Encounter (HOSPITAL_COMMUNITY): Payer: Self-pay | Admitting: Cardiology

## 2011-08-23 DIAGNOSIS — I5032 Chronic diastolic (congestive) heart failure: Secondary | ICD-10-CM

## 2011-08-23 LAB — CBC
HCT: 31.6 % — ABNORMAL LOW (ref 39.0–52.0)
Hemoglobin: 10.7 g/dL — ABNORMAL LOW (ref 13.0–17.0)
RBC: 3.73 MIL/uL — ABNORMAL LOW (ref 4.22–5.81)
RDW: 14.1 % (ref 11.5–15.5)

## 2011-08-23 LAB — COMPREHENSIVE METABOLIC PANEL
AST: 12 U/L (ref 0–37)
Albumin: 2.3 g/dL — ABNORMAL LOW (ref 3.5–5.2)
Alkaline Phosphatase: 99 U/L (ref 39–117)
BUN: 24 mg/dL — ABNORMAL HIGH (ref 6–23)
Chloride: 104 mEq/L (ref 96–112)
Potassium: 4 mEq/L (ref 3.5–5.1)
Total Bilirubin: 0.5 mg/dL (ref 0.3–1.2)

## 2011-08-23 LAB — GLUCOSE, CAPILLARY: Glucose-Capillary: 90 mg/dL (ref 70–99)

## 2011-08-23 MED ORDER — FUROSEMIDE 40 MG PO TABS
40.0000 mg | ORAL_TABLET | Freq: Every day | ORAL | Status: DC
  Administered 2011-08-23: 40 mg via ORAL
  Filled 2011-08-23: qty 1

## 2011-08-23 MED ORDER — POTASSIUM CHLORIDE CRYS ER 20 MEQ PO TBCR: 20.0000 meq | EXTENDED_RELEASE_TABLET | Freq: Every day | ORAL | Status: DC

## 2011-08-23 MED ORDER — INSULIN GLARGINE 100 UNIT/ML ~~LOC~~ SOLN: 30.0000 [IU] | Freq: Every day | SUBCUTANEOUS | Status: DC

## 2011-08-23 MED ORDER — FUROSEMIDE 40 MG PO TABS: 40.0000 mg | ORAL_TABLET | Freq: Every day | ORAL | Status: DC

## 2011-08-23 MED ORDER — CEPHALEXIN 500 MG PO CAPS: 500.0000 mg | ORAL_CAPSULE | Freq: Four times a day (QID) | ORAL | Status: AC

## 2011-08-23 MED ORDER — ATORVASTATIN CALCIUM 80 MG PO TABS: 80.0000 mg | ORAL_TABLET | Freq: Every day | ORAL | Status: DC

## 2011-08-23 NOTE — Progress Notes (Signed)
PROGRESS NOTE  Subjective:   Pt was admitted with CP.  Cath yesterday showed severe native CAD, occluded SVG to RCA and occluded SVG to Diag.  The LIMA to LAD and the SVG to OM were patent.  He developed anemia.  His Hb has started to increase ( although it is still lower than normal).  Had an episode of hypoglycemia - Lantus insulin was decreased from 40 to 35 units at night.  He was found to have some hypoxemia with low oxygen saturations with ambulation yesterday. We had intended on this chart him yesterday but started him on Lasix.  He's feeling better today. His O2 saturations have been better.   Objective:    Vital Signs:   Temp:  [97.4 F (36.3 C)-99 F (37.2 C)] 99 F (37.2 C) (08/15 0449) Pulse Rate:  [83-89] 83  (08/15 0449) Resp:  [18-19] 19  (08/15 0449) BP: (162-183)/(79-87) 162/82 mmHg (08/15 0600) SpO2:  [94 %-97 %] 94 % (08/15 0449) Weight:  [229 lb 8 oz (104.1 kg)] 229 lb 8 oz (104.1 kg) (08/15 1014)  Last BM Date: 08/22/11   24-hour weight change: Weight change:   Weight trends: Filed Weights   08/21/11 0541 08/22/11 1207 08/23/11 1014  Weight: 231 lb 0.7 oz (104.8 kg) 233 lb 0.4 oz (105.7 kg) 229 lb 8 oz (104.1 kg)    Intake/Output:  08/14 0701 - 08/15 0700 In: 240 [P.O.:240] Out: 1450 [Urine:1450] Total I/O In: 240 [P.O.:240] Out: -    Physical Exam: BP 162/82  Pulse 83  Temp 99 F (37.2 C) (Oral)  Resp 19  Ht 6' (1.829 m)  Wt 229 lb 8 oz (104.1 kg)  BMI 31.13 kg/m2  SpO2 94%  General: Vital signs reviewed and noted. Well-developed, well-nourished, in no acute distress; alert, appropriate and cooperative .  Head: Normocephalic, atraumatic.  Eyes: conjunctivae/corneas clear.  EOM's intact.   Throat: normal  Neck: Supple. Normal carotids. No JVD  Lungs:  Breath sounds are better  Heart: Regular rate,  With normal  S1 S2. No murmurs, gallops or rubs  Abdomen:  Soft, non-tender, non-distended with normoactive bowel sounds. No  hepatomegaly. No rebound/guarding. No abdominal masses.  Extremities: Distal pedal pulses are 2+ .  No edema. Left leg is bandaged.   Neurologic: A&O X3, CN II - XII are grossly intact. Motor strength is 5/5 in the all 4 extremities.  Psych: Responds to questions appropriately with normal affect.    Labs: BMET:  Basename 08/23/11 0600 08/22/11 1210  NA 137 136  K 4.0 4.7  CL 104 105  CO2 23 22  GLUCOSE 72 184*  BUN 24* 23  CREATININE 1.35 1.23  CALCIUM 8.8 9.0  MG -- --  PHOS -- --    Liver function tests:  Basename 08/23/11 0600  AST 12  ALT 13  ALKPHOS 99  BILITOT 0.5  PROT 6.0  ALBUMIN 2.3*   No results found for this basename: LIPASE:2,AMYLASE:2 in the last 72 hours  CBC:  Basename 08/23/11 0600 08/22/11 0520  WBC 6.2 7.1  NEUTROABS -- --  HGB 10.7* 11.4*  HCT 31.6* 34.0*  MCV 84.7 84.0  PLT 247 248    Cardiac Enzymes: No results found for this basename: CKTOTAL:4,CKMB:4,TROPONINI:4 in the last 72 hours  Coagulation Studies: No results found for this basename: LABPROT:5,INR:5 in the last 72 hours  Other: No components found with this basename: POCBNP:3 No results found for this basename: DDIMER in the last 72 hours No  results found for this basename: HGBA1C in the last 72 hours No results found for this basename: CHOL,HDL,LDLCALC,TRIG,CHOLHDL in the last 72 hours No results found for this basename: TSH,T4TOTAL,FREET3,T3FREE,THYROIDAB in the last 72 hours  Basename 08/21/11 0837  VITAMINB12 415  FOLATE --  FERRITIN --  TIBC 271  IRON 28*  RETICCTPCT 2.5    Tele:  NSR  Medications:    Infusions:    Scheduled Medications:    . amLODipine  10 mg Oral Daily  . aspirin EC  81 mg Oral Daily  . atorvastatin  80 mg Oral q1800  . carvedilol  12.5 mg Oral BID WC  . cephALEXin  500 mg Oral Q6H  . doxazosin  8 mg Oral QHS  . furosemide  40 mg Intravenous Once  . glipiZIDE  5 mg Oral QAC breakfast  . insulin aspart  0-15 Units Subcutaneous  TID WC  . insulin glargine  30 Units Subcutaneous QHS  . potassium chloride  20 mEq Oral Daily    Assessment/ Plan:    DM (01/05/2009) Have reduced the nighttime Lantus to 35 (from 40).  He will need to see Dr. Clovis Riley for further eval  Chronic systolic CHF:  His EF is 40 % .  This is presumed to be due to ischemic cardiomyopathy.  Lasix and Kdur were added yesterday.  DYSLIPIDEMIA (01/05/2009) Continue atorvastatin  HYPERTENSION (01/05/2009)  well controlled  MYOCARDIAL INFARCTION, ACUTE, SUBENDOCARDIAL (01/05/2009) Likely due to his diffuse CAD.  No specific culprit lesion - continue medical therapy.  DIASTOLIC HEART FAILURE, CHRONIC (01/06/2009)  Dyspnea:  Has decreased breath sounds in right base.  Will get PA and Lat CXR.  Anemia:  Hb has already started to increase.   Further eval will be as OP by Dr. Clovis Riley.  Disposition: ambulate, home today. Amlodipine 10 qd NTG 0.4 SL prn Lasix 40 Asa 81 Atorvastatin 80 Coreg 12.5 bid Keflex 500 QID for 5 more days ( for leg infection, follow up with Dr. Clovis Riley) Doxazosin 8 at hs Glipizide 5 qd lantus insulin 34 hs Metformin 500 bid KCl 20 qd - will need BMP in 1 week  (should go to medical doctor or Can be in our office and directed to me)- he was on 60 meq daily but not on any diuretic on admission.    Length of Stay: 4  Vesta Mixer, Montez Hageman., MD, [facility] 08/23/2011, 12:14 PM Office [phone] Pager [phone]

## 2011-08-23 NOTE — Progress Notes (Signed)
CARDIAC REHAB PHASE I   PRE:  Rate/Rhythm: 79SR  BP:  Supine: 120/60  Sitting:   Standing:    SaO2: 93%2L,92%RA  MODE:  Ambulation: 320 ft   POST:  Rate/Rhythem: 94  BP:  Supine:   Sitting: 150/90  Standing:    SaO2: 89-93%RA 1345-1417 Pt did not want to walk. Just wanted to rest. Pt walked 320 ft on RA with rolling walker and asst x 1. Pt stated he was dizzy several times during walk. Sats much better today than yesterday. Asked pt if he felt less SOB today and he said yes. Stopped several times to rest. Was exhausted by end of walk and DOE noted but sats 89-93% whole walk. Left off of oxygen in room. Assisted to bed and elevated leg. C/o leg pain to thigh area. Pt needs motivation to mobilize. I came earlier this am and pt wanted to rest. Discussed with pt that he has to mobilize at home.   Duanne Limerick

## 2011-08-23 NOTE — Discharge Summary (Signed)
Discharge Summary   Patient ID: Joshua Vincent MRN: [id number], DOB/AGE: 07/17/1934 77 y.o.  Primary MD: Benita Stabile, MD Primary Cardiologist: Dr. Shirlee Latch  Admit date: 08/19/2011 D/C date:     08/23/2011      Primary Discharge Diagnoses:  1. NSTEMI/Coronary Artery Disease  - H/o 4v CABG 2011, LIMA LAD, SVG-D, SVG-OM, SVG-PLV)  - Cath revealed severe native CAD, patent SVG to OM & LIMA to LAD, occluded SVG to RCA and SVG to Diagonal, EF 40%, no culprit lesion for PCI  - Continued medical therapy  2. Acute on Chronic Systolic and Diastolic CHF  - Previously nl EF  - EF 40% by cath this admission  - Diuresed with lasix, transitioned to oral at d/c  - No ACEI/ARB given renal insuffiencey, consider initiation at follow up if Crt stable  - Discharge weight 229lbs  - F/u BMET, consider follow up Echo (last one in 2011)  3. Fe Deficiency Anemia  - Hgb 12.5 --> 10.3 --> 10.7  - Anemia panel showed low Iron  - No signs of active bleeding  - F/u CBC with PCP  4. Superficial Left Leg Wound  - Initiated on Keflex (complete 7 day course 8/19)  5. Diabetes Mellitus, Type 2  - Lantus decreased to 30units for episode of morning hypoglycemia  - F/u with PCP  6. Mild renal insufficiency  - Crt improved, 1.35 at dc  - F/u BMET  7. Hyperlipidemia  - LDL 142, goal < 70  - Initiated on Lipitor, will need f/u lipids/LFTs in 6-8wks  8. Possible OSA  - Recommend outpatient sleep study  Secondary Discharge Diagnoses:  1. Hypertension 2. CVA (peri-cath 2010) 3. Obesity 4. H/o Tobacco abuse - quit 1992 5. Osteoarthritis 6. Bladder CA s/p resection 07/2009 7. Appendectomy  8. Retinal detachment surgery  9. Cataract surg  10. Transurethral resection of prostate    Allergies Allergies  Allergen Reactions  . Niacin Itching    Diagnostic Studies/Procedures:   08/20/11 - Cardiac Cath Hemodynamics:  LV pressure: 136/11  Aortic pressure: 132/67  Angiography  Left Main:  normal  Left anterior Descending: Occluded proximally, there are right to left collaterals that fill the proximal and mid septal branches.  Left Circumflex: subtotally occluded proximally with sluggish flow down a small OM.  Right Coronary Artery: prox is normal. Mid- moderate disease 40%, distal - mild disease. The PDA has a 80-90% stenosis at it's mid point . There is a 90% long stenosis in the proximal PLSA.  SVG to RCA: ( JR4 catheter) Occluded proximally  SVG to Diagonal: (Left coronary bypass catheter) Occluded proximally  SVG to OM: ( Left coronary bypass catheter) The graft is large. There are moderate irregularities in the mid OM.  LIMA to LAD: Large graft, patent anastomosis. The LAD has only minor luminal irregularities  LV Gram: anterior akinesis, The LV function is mildly depressed. EF 40%  Complications: No apparent complications  Patient did tolerate procedure well.  Contrast used: 120 cc  Conclusions:  1. Significant CAD involving all 3 coronaries.  2. Occluded SVG to RCA and SVG to Diag. The SVG to OM and the LIMA to LAD is patent  3. Mildly reduced LV function with anterior akinesis.  He has no stenosis that are amenable to PCI. Will continue with medical therapy   History of Present Illness: 77 y.o. male w/ the above medical problems who presented to [facility] on 08/19/11 with complaints of chest pain.  He reported  exertional chest pressure associated with shortness of breath for the week prior to presentation that was worse on the day of presentation prompting him to present to the ED. He also noted injury with superficial wound to his left leg that had been weeping.  Hospital Course: EKG revealed NSR with no acute ST/T changes. CXR showed linear opacities medially at the lung bases suspicious for early atypical pneumonia or bronchopneumonia. Labs were significant for BNP 6015, poc troponin 0.56, Hgb 11.9, BUN/Crt 24/1.4, and Glucose 122. He was felt to be volume  overloaded on exam with elevated BP for which he was placed on IV NTG. He was placed on IV heparin and admitted for further evaluation and treatment.   Cardiac enzymes were cycled with peak troponin 2.33. Cardiac cath was performed on 08/20/11 revealing patent SVG to OM & LIMA to LAD, occluded SVG to RCA and SVG to Diagonal, EF 40%, no stenoses that are amenable to PCI. He tolerated the procedure well without complications. Recommendations were made for continued medical therapy. Cardiac enzymes trended down and he had no complaints of chest pain. Cath site remained stable. No ACEI/ARB given renal insuffiencey, consider initiation at follow up if Crt stable.  He ambulated with cardiac rehab with desaturations into the upper 80s and complaints of sob on room air with improvement with supplemental oxygen. Repeat CXR on 8/13 revealed mild lung base opacity, L>R, likely atelectasis. There was no leukocytosis, fever, or clinical symptoms to suggest pneumonia. He was given one dose of IV lasix, instructed on incentive spirometry and kept for observation. On day of discharge his shortness of breath was improved significantly and he was able to walk with sats 89-93%. He was discharged on oral lasix and potassium with plans for follow up BMET next week. Discharge weight 229lbs.  He was evaluated by the wound team and placed on Keflex for his left leg wound. He is to complete a 7 day course with last dose on 8/19 and follow up with PCP. H&H was 11.9 on admission, decreased to 10.3 and was 10.7 at discharge. There were no active signs of bleeding. Fe on anemia panel was found to be low. He was instructed to follow up with his PCP for further evaluation and management. His renal insufficiency improved with Crt 1.35 on day of discharge. Lantus was decreased for an episode of morning hypoglycemia with recommendations for follow up with his PCP. There was suspicion for obstructive sleep apnea with recommendations for  outpatient sleep study.  Case management and cardiac rehab felt he would benefit from home health PT and RN which was arranged on day of discharge. He was seen and evaluated by Dr. Elease Hashimoto who felt he was stable for discharge home with plans for follow up as scheduled below.  Discharge Vitals: Blood pressure 156/83, pulse 82, temperature 98.3 F (36.8 C), temperature source Oral, resp. rate 18, height 6' (1.829 m), weight 229 lb 8 oz (104.1 kg), SpO2 92.00%.  Labs: Component Value Date   WBC 6.2 08/23/2011   HGB 10.7* 08/23/2011   HCT 31.6* 08/23/2011   MCV 84.7 08/23/2011   PLT 247 08/23/2011     08/21/2011 08:37  Iron 28 (L)  UIBC 243  TIBC 271  Saturation Ratios 10 (L)  RBC Folate 1439 (H)  Vit B12 415   Lab 08/23/11 0600  NA 137  K 4.0  CL 104  CO2 23  BUN 24*  CREATININE 1.35  CALCIUM 8.8  PROT 6.0  BILITOT 0.5  ALKPHOS 99  ALT 13  AST 12  GLUCOSE 72   Component Value Date   CHOL 205* 08/20/2011   HDL 40 08/20/2011   LDLCALC 142* 08/20/2011   TRIG 115 08/20/2011     08/19/2011 16:05  Pro B Natriuretic peptide (BNP) 6015.0 (H)     08/19/2011 22:01 08/20/2011 02:40 08/20/2011 08:15  CK, MB 7.5 (HH) 7.0 (HH) 6.2 (HH)  CK Total 135 133 118  Troponin I 2.33 (HH) 1.68 (HH) 1.96 (HH)    Discharge Medications   Medication List  As of 08/23/2011  3:04 PM   STOP taking these medications         aspirin 325 MG tablet         TAKE these medications         amLODipine 10 MG tablet   Commonly known as: NORVASC   Take 10 mg by mouth daily.      aspirin EC 81 MG tablet   Take 81 mg by mouth daily.      atorvastatin 80 MG tablet   Commonly known as: LIPITOR   Take 1 tablet (80 mg total) by mouth at bedtime.      carvedilol 12.5 MG tablet   Commonly known as: COREG   Take 12.5 mg by mouth 2 (two) times daily with a meal.      cephALEXin 500 MG capsule   Commonly known as: KEFLEX   Take 1 capsule (500 mg total) by mouth every 6 (six) hours.      doxazosin 8 MG  tablet   Commonly known as: CARDURA   Take 1 tablet (8 mg total) by mouth at bedtime.      furosemide 40 MG tablet   Commonly known as: LASIX   Take 1 tablet (40 mg total) by mouth daily.      glipiZIDE 5 MG tablet   Commonly known as: GLUCOTROL   Take 5 mg by mouth daily.      insulin glargine 100 UNIT/ML injection   Commonly known as: LANTUS   Inject 30 Units into the skin at bedtime.      metFORMIN 500 MG tablet   Commonly known as: GLUCOPHAGE   Take 500 mg by mouth 2 (two) times daily with a meal.      naproxen 375 MG tablet   Commonly known as: NAPROSYN   Take 375 mg by mouth every 12 (twelve) hours.      nitroGLYCERIN 0.4 MG SL tablet   Commonly known as: NITROSTAT   Place 0.4 mg under the tongue every 5 (five) minutes as needed. For chest pain      omega-3 acid ethyl esters 1 G capsule   Commonly known as: LOVAZA   Take 1 g by mouth daily.      potassium chloride SA 20 MEQ tablet   Commonly known as: K-DUR,KLOR-CON   Take 1 tablet (20 mEq total) by mouth daily. Take 2 tablets in the morning and 1 tablet in the afternoon.      traMADol 50 MG tablet   Commonly known as: ULTRAM   Take 50 mg by mouth every 6 (six) hours as needed. For pain      vitamin B-12 50 MCG tablet   Commonly known as: CYANOCOBALAMIN   Take 50 mcg by mouth daily.            Disposition   Discharge Orders    Future Appointments: Provider: Department: Dept Phone: Center:   08/28/2011 11:00 AM  Dyann Kief, PA [facility] [phone] [facility]     Future Orders Please Complete By Expires   Diet - low sodium heart healthy      Increase activity slowly      Discharge instructions      Comments:   * KEEP GROIN SITE CLEAN AND DRY. Call the office for any signs of bleedings, pus, swelling, increased pain, or any other concerns. * NO HEAVY LIFTING (>10lbs) OR SEXUAL ACTIVITY X 4 DAYS. * NO SOAKING BATHS, HOT TUBS, POOLS, ETC., X 4 DAYS.  * Please take your antibiotics  for your leg infection as prescribed and follow up with your primary care provider.  * Your blood count and iron levels were found to be low, but stable. Please follow up with your primary care provider.  * Your blood sugar was low during this admission for which your nighttime Lantus dose was decreased. Please follow up with your primary care provider.  * You were started on a cholesterol medication (Lipitor) this hospitalization and will need follow up blood work (lipid panel and liver function tests) in 6-8wks with your primary care provider.   * There is concern you may have obstructive sleep apnea. It is recommended you follow up with your primary care provider to discuss outpatient sleep study.  * Your heart catheterization showed weakness of the heart muscle this admission. This may make you more susceptible to weight gain from fluid retention, which can lead to symptoms that we call heart failure. You were started on Lasix (a diuretic) and will need to have follow up blood work [employer]) next week to monitor your kidney function.  For patients with this condition, we give them these special instructions:   1. Follow a low-salt diet and watch your fluid intake. In general, you should not be taking in more than 2 liters of fluid per day. Some patients are restricted to less than 1.5 liters. This includes sources of water in foods like soup. 2. Weigh yourself on the same scale at same time of day and keep a log. 3. Call your doctor: (Anytime you feel any of the following symptoms)  - 3-4 pound weight gain in 1-2 days or 2 pounds overnight  - Shortness of breath, with or without a dry hacking cough  - Swelling in the hands, feet or stomach  - If you have to sleep on extra pillows at night in order to breathe  **PLEASE REMEMBER TO BRING ALL OF YOUR MEDICATIONS TO EACH OF YOUR FOLLOW-UP OFFICE VISITS.     Follow-up Information    Follow up with Benita Stabile, MD. Schedule an  appointment as soon as possible for a visit in 1 week.   Contact information:   [facility] [address], [address] [place] [address] [phone]       Follow up with Jacolyn Reedy, PA on 08/28/2011. (11:00; Please arrive early or stay late for your lab work)    Contact information:   [facility] [address] [place] [address] [phone]           Outstanding Labs/Studies:  1. BMET 2. Patient will require follow up lipid panel and liver function tests in 6-8 weeks as we initiated a new statin during this hospitalization. 3. Outpatient sleep study   Duration of Discharge Encounter: Greater than 30 minutes including physician and PA time.  Signed, HOPE, JESSICA PA-C 08/23/2011, 3:04 PM Attending Note:  The patient was seen and examined.  Agree with assessment and plan as noted above.  See note from earlier 08/23/11.  Vesta Mixer, Montez Hageman., MD, [facility] 08/24/2011, 6:02 PM

## 2011-08-28 ENCOUNTER — Encounter: Payer: Self-pay | Admitting: Physician Assistant

## 2011-08-28 ENCOUNTER — Other Ambulatory Visit: Payer: Self-pay

## 2011-08-28 NOTE — ED Provider Notes (Signed)
Medical screening examination/treatment/procedure(s) were conducted as a shared visit with non-physician practitioner(s) and myself.  I personally evaluated the patient during the encounter  Lynora Dymond, MD 08/28/11 0715 

## 2011-09-19 ENCOUNTER — Ambulatory Visit: Payer: Self-pay | Admitting: Physician Assistant

## 2011-09-19 ENCOUNTER — Telehealth: Payer: Self-pay | Admitting: Cardiology

## 2011-09-19 NOTE — Telephone Encounter (Signed)
Spoke with patient. Pt states he is not having any acute problems. He is requesting an appt to be checked after being hospitalized 08/23/11.

## 2011-09-19 NOTE — Telephone Encounter (Signed)
New problem:  C/O would like to be seen today. H/O recent mi - 2 blockage. Problem breathing.

## 2011-09-19 NOTE — Telephone Encounter (Signed)
Pt states he does not remember having an appt 08/28/11 with Elon Jester. I offered pt an appt today at 11:50am with Lorin Picket but he could not make that appt time.

## 2011-09-19 NOTE — Telephone Encounter (Signed)
I have scheduled an appt for pt with [facility] 09/21/11. Pt's wife states he will be here for that appt.

## 2011-09-21 ENCOUNTER — Encounter: Payer: Self-pay | Admitting: Physician Assistant

## 2011-09-21 ENCOUNTER — Inpatient Hospital Stay (HOSPITAL_COMMUNITY)
Admission: AD | Admit: 2011-09-21 | Discharge: 2011-09-25 | DRG: 293 | Disposition: A | Discharge status: Home Health | Payer: Managed Care, Other (non HMO) | Source: Ambulatory Visit | Attending: Cardiology | Admitting: Cardiology

## 2011-09-21 ENCOUNTER — Ambulatory Visit (INDEPENDENT_AMBULATORY_CARE_PROVIDER_SITE_OTHER): Payer: Medicare Other | Admitting: Physician Assistant

## 2011-09-21 ENCOUNTER — Encounter (HOSPITAL_COMMUNITY): Payer: Self-pay | Admitting: *Deleted

## 2011-09-21 VITALS — BP 198/108 | HR 86 | Ht 71.5 in | Wt 208.4 lb

## 2011-09-21 DIAGNOSIS — Z8551 Personal history of malignant neoplasm of bladder: Secondary | ICD-10-CM

## 2011-09-21 DIAGNOSIS — I129 Hypertensive chronic kidney disease with stage 1 through stage 4 chronic kidney disease, or unspecified chronic kidney disease: Secondary | ICD-10-CM | POA: Diagnosis present

## 2011-09-21 DIAGNOSIS — I10 Essential (primary) hypertension: Secondary | ICD-10-CM

## 2011-09-21 DIAGNOSIS — N189 Chronic kidney disease, unspecified: Secondary | ICD-10-CM | POA: Diagnosis present

## 2011-09-21 DIAGNOSIS — I214 Non-ST elevation (NSTEMI) myocardial infarction: Secondary | ICD-10-CM

## 2011-09-21 DIAGNOSIS — I5032 Chronic diastolic (congestive) heart failure: Secondary | ICD-10-CM

## 2011-09-21 DIAGNOSIS — Z91199 Patient's noncompliance with other medical treatment and regimen due to unspecified reason: Secondary | ICD-10-CM

## 2011-09-21 DIAGNOSIS — N400 Enlarged prostate without lower urinary tract symptoms: Secondary | ICD-10-CM | POA: Diagnosis present

## 2011-09-21 DIAGNOSIS — E119 Type 2 diabetes mellitus without complications: Secondary | ICD-10-CM

## 2011-09-21 DIAGNOSIS — I5033 Acute on chronic diastolic (congestive) heart failure: Secondary | ICD-10-CM

## 2011-09-21 DIAGNOSIS — E876 Hypokalemia: Secondary | ICD-10-CM | POA: Diagnosis present

## 2011-09-21 DIAGNOSIS — W230XXA Caught, crushed, jammed, or pinched between moving objects, initial encounter: Secondary | ICD-10-CM | POA: Diagnosis present

## 2011-09-21 DIAGNOSIS — I5043 Acute on chronic combined systolic (congestive) and diastolic (congestive) heart failure: Secondary | ICD-10-CM

## 2011-09-21 DIAGNOSIS — I2510 Atherosclerotic heart disease of native coronary artery without angina pectoris: Secondary | ICD-10-CM | POA: Diagnosis present

## 2011-09-21 DIAGNOSIS — Z9119 Patient's noncompliance with other medical treatment and regimen: Secondary | ICD-10-CM

## 2011-09-21 DIAGNOSIS — I169 Hypertensive crisis, unspecified: Secondary | ICD-10-CM

## 2011-09-21 DIAGNOSIS — Y929 Unspecified place or not applicable: Secondary | ICD-10-CM

## 2011-09-21 DIAGNOSIS — D649 Anemia, unspecified: Secondary | ICD-10-CM | POA: Diagnosis present

## 2011-09-21 DIAGNOSIS — Z87891 Personal history of nicotine dependence: Secondary | ICD-10-CM

## 2011-09-21 DIAGNOSIS — Z951 Presence of aortocoronary bypass graft: Secondary | ICD-10-CM

## 2011-09-21 DIAGNOSIS — S8990XA Unspecified injury of unspecified lower leg, initial encounter: Secondary | ICD-10-CM | POA: Diagnosis present

## 2011-09-21 DIAGNOSIS — Z79899 Other long term (current) drug therapy: Secondary | ICD-10-CM

## 2011-09-21 DIAGNOSIS — E1169 Type 2 diabetes mellitus with other specified complication: Secondary | ICD-10-CM | POA: Diagnosis present

## 2011-09-21 DIAGNOSIS — E669 Obesity, unspecified: Secondary | ICD-10-CM | POA: Diagnosis present

## 2011-09-21 DIAGNOSIS — R0989 Other specified symptoms and signs involving the circulatory and respiratory systems: Secondary | ICD-10-CM

## 2011-09-21 DIAGNOSIS — E785 Hyperlipidemia, unspecified: Secondary | ICD-10-CM | POA: Diagnosis present

## 2011-09-21 DIAGNOSIS — I5042 Chronic combined systolic (congestive) and diastolic (congestive) heart failure: Secondary | ICD-10-CM

## 2011-09-21 DIAGNOSIS — M19019 Primary osteoarthritis, unspecified shoulder: Secondary | ICD-10-CM | POA: Diagnosis present

## 2011-09-21 DIAGNOSIS — Z7982 Long term (current) use of aspirin: Secondary | ICD-10-CM

## 2011-09-21 DIAGNOSIS — Z8673 Personal history of transient ischemic attack (TIA), and cerebral infarction without residual deficits: Secondary | ICD-10-CM

## 2011-09-21 DIAGNOSIS — Y990 Civilian activity done for income or pay: Secondary | ICD-10-CM

## 2011-09-21 LAB — CBC WITH DIFFERENTIAL/PLATELET
Eosinophils Absolute: 0.2 10*3/uL (ref 0.0–0.7)
Hemoglobin: 13.9 g/dL (ref 13.0–17.0)
Lymphocytes Relative: 31 % (ref 12–46)
Lymphs Abs: 1.9 10*3/uL (ref 0.7–4.0)
MCH: 27.3 pg (ref 26.0–34.0)
Monocytes Relative: 9 % (ref 3–12)
Neutrophils Relative %: 56 % (ref 43–77)
RBC: 5.09 MIL/uL (ref 4.22–5.81)
WBC: 6.1 10*3/uL (ref 4.0–10.5)

## 2011-09-21 LAB — COMPREHENSIVE METABOLIC PANEL
ALT: 12 U/L (ref 0–53)
Alkaline Phosphatase: 108 U/L (ref 39–117)
BUN: 14 mg/dL (ref 6–23)
CO2: 28 mEq/L (ref 19–32)
Chloride: 104 mEq/L (ref 96–112)
GFR calc Af Amer: 63 mL/min — ABNORMAL LOW (ref >90)
GFR calc non Af Amer: 54 mL/min — ABNORMAL LOW (ref >90)
Glucose, Bld: 149 mg/dL — ABNORMAL HIGH (ref 70–99)
Potassium: 3.2 mEq/L — ABNORMAL LOW (ref 3.5–5.1)
Sodium: 142 mEq/L (ref 135–145)
Total Bilirubin: 0.4 mg/dL (ref 0.3–1.2)
Total Protein: 6.9 g/dL (ref 6.0–8.3)

## 2011-09-21 LAB — PROTIME-INR: Prothrombin Time: 13.2 seconds (ref 11.6–15.2)

## 2011-09-21 MED ORDER — SODIUM CHLORIDE 0.9 % IJ SOLN: 3.0000 mL | INTRAMUSCULAR | Status: DC | PRN

## 2011-09-21 MED ORDER — ONDANSETRON HCL 4 MG/2ML IJ SOLN: 4.0000 mg | Freq: Four times a day (QID) | INTRAMUSCULAR | Status: DC | PRN

## 2011-09-21 MED ORDER — OMEGA-3-ACID ETHYL ESTERS 1 G PO CAPS
1.0000 g | ORAL_CAPSULE | Freq: Every day | ORAL | Status: DC
  Administered 2011-09-21 – 2011-09-22 (×2): 1 g via ORAL
  Filled 2011-09-21 (×2): qty 1

## 2011-09-21 MED ORDER — DOXAZOSIN MESYLATE 8 MG PO TABS
8.0000 mg | ORAL_TABLET | Freq: Every day | ORAL | Status: DC
  Administered 2011-09-21 – 2011-09-24 (×4): 8 mg via ORAL
  Filled 2011-09-21 (×5): qty 1

## 2011-09-21 MED ORDER — ASPIRIN EC 81 MG PO TBEC
81.0000 mg | DELAYED_RELEASE_TABLET | Freq: Every day | ORAL | Status: DC
  Administered 2011-09-21 – 2011-09-25 (×5): 81 mg via ORAL
  Filled 2011-09-21 (×5): qty 1

## 2011-09-21 MED ORDER — INSULIN GLARGINE 100 UNIT/ML ~~LOC~~ SOLN
30.0000 [IU] | Freq: Every day | SUBCUTANEOUS | Status: DC
  Administered 2011-09-21: 30 [IU] via SUBCUTANEOUS

## 2011-09-21 MED ORDER — ATORVASTATIN CALCIUM 80 MG PO TABS
80.0000 mg | ORAL_TABLET | Freq: Every day | ORAL | Status: DC
  Administered 2011-09-21: 80 mg via ORAL
  Filled 2011-09-21 (×2): qty 1

## 2011-09-21 MED ORDER — NITROGLYCERIN 0.4 MG SL SUBL: 0.4000 mg | SUBLINGUAL_TABLET | SUBLINGUAL | Status: DC | PRN

## 2011-09-21 MED ORDER — SODIUM CHLORIDE 0.9 % IJ SOLN
3.0000 mL | Freq: Two times a day (BID) | INTRAMUSCULAR | Status: DC
  Administered 2011-09-21 – 2011-09-25 (×8): 3 mL via INTRAVENOUS

## 2011-09-21 MED ORDER — POTASSIUM CHLORIDE CRYS ER 20 MEQ PO TBCR
20.0000 meq | EXTENDED_RELEASE_TABLET | Freq: Two times a day (BID) | ORAL | Status: DC
  Administered 2011-09-21 – 2011-09-25 (×8): 20 meq via ORAL
  Filled 2011-09-21 (×11): qty 1

## 2011-09-21 MED ORDER — CARVEDILOL 12.5 MG PO TABS: 12.5000 mg | ORAL_TABLET | Freq: Two times a day (BID) | ORAL | Status: DC

## 2011-09-21 MED ORDER — METFORMIN HCL 500 MG PO TABS
500.0000 mg | ORAL_TABLET | Freq: Two times a day (BID) | ORAL | Status: DC
  Administered 2011-09-22 – 2011-09-25 (×7): 500 mg via ORAL
  Filled 2011-09-21 (×9): qty 1

## 2011-09-21 MED ORDER — INSULIN ASPART 100 UNIT/ML ~~LOC~~ SOLN
0.0000 [IU] | Freq: Three times a day (TID) | SUBCUTANEOUS | Status: DC
  Administered 2011-09-22: 5 [IU] via SUBCUTANEOUS
  Administered 2011-09-23: 3 [IU] via SUBCUTANEOUS
  Administered 2011-09-23 – 2011-09-24 (×2): 5 [IU] via SUBCUTANEOUS
  Administered 2011-09-24 (×2): 3 [IU] via SUBCUTANEOUS
  Administered 2011-09-25: 06:00:00 via SUBCUTANEOUS
  Administered 2011-09-25: 3 [IU] via SUBCUTANEOUS

## 2011-09-21 MED ORDER — SODIUM CHLORIDE 0.9 % IV SOLN: 250.0000 mL | INTRAVENOUS | Status: DC | PRN

## 2011-09-21 MED ORDER — TRAMADOL HCL 50 MG PO TABS
50.0000 mg | ORAL_TABLET | Freq: Four times a day (QID) | ORAL | Status: DC | PRN
  Filled 2011-09-21: qty 1

## 2011-09-21 MED ORDER — NITROGLYCERIN IN D5W 200-5 MCG/ML-% IV SOLN
2.0000 ug/min | INTRAVENOUS | Status: DC
  Administered 2011-09-21: 20 ug/min via INTRAVENOUS
  Filled 2011-09-21: qty 250

## 2011-09-21 MED ORDER — CARVEDILOL 12.5 MG PO TABS
12.5000 mg | ORAL_TABLET | Freq: Two times a day (BID) | ORAL | Status: DC
  Administered 2011-09-21 – 2011-09-22 (×2): 12.5 mg via ORAL
  Filled 2011-09-21 (×4): qty 1

## 2011-09-21 MED ORDER — ENOXAPARIN SODIUM 40 MG/0.4ML ~~LOC~~ SOLN
40.0000 mg | SUBCUTANEOUS | Status: DC
  Administered 2011-09-21 – 2011-09-24 (×4): 40 mg via SUBCUTANEOUS
  Filled 2011-09-21 (×5): qty 0.4

## 2011-09-21 MED ORDER — VITAMIN B-12 100 MCG PO TABS
50.0000 ug | ORAL_TABLET | Freq: Every day | ORAL | Status: DC
  Administered 2011-09-21 – 2011-09-25 (×5): 50 ug via ORAL
  Filled 2011-09-21 (×5): qty 1

## 2011-09-21 MED ORDER — AMLODIPINE BESYLATE 10 MG PO TABS
10.0000 mg | ORAL_TABLET | Freq: Every day | ORAL | Status: DC
  Administered 2011-09-21 – 2011-09-25 (×5): 10 mg via ORAL
  Filled 2011-09-21 (×7): qty 1

## 2011-09-21 MED ORDER — FUROSEMIDE 10 MG/ML IJ SOLN
40.0000 mg | Freq: Two times a day (BID) | INTRAMUSCULAR | Status: DC
  Administered 2011-09-21 – 2011-09-22 (×2): 40 mg via INTRAVENOUS
  Filled 2011-09-21 (×3): qty 4

## 2011-09-21 MED ORDER — GLIPIZIDE 5 MG PO TABS
5.0000 mg | ORAL_TABLET | Freq: Every day | ORAL | Status: DC
  Administered 2011-09-22 – 2011-09-24 (×2): 5 mg via ORAL
  Filled 2011-09-21 (×4): qty 1

## 2011-09-21 MED ORDER — ACETAMINOPHEN 325 MG PO TABS: 650.0000 mg | ORAL_TABLET | ORAL | Status: DC | PRN

## 2011-09-21 NOTE — Patient Instructions (Signed)
PT ADMITTED TO TELE BED DX ACUTE ON CHRONIC SYSTOLIC AND DIASTOLIC HEART FAILURE

## 2011-09-21 NOTE — Progress Notes (Signed)
[address] [place], [place]  [address] Phone: [phone] Fax:  [phone]  Date:  09/21/2011   Name:  Joshua Vincent   DOB:  18-Nov-1934   MRN:  [id number]  PCP:  Benita Stabile, MD  Primary Cardiologist:  Dr. Marca Ancona  Primary Electrophysiologist:  None    History of Present Illness: Joshua Vincent is a 77 y.o. male who returns for post hospital follow up.  He has a history of CAD s/p NSTEMI in 12/10 accompanied by pulmonary edema and peri-catheterization CVA, now s/p CABG.   He was admitted 8/11-8/15 with a non-STEMI complicated by acute on chronic combined systolic and diastolic CHF and lower extremity cellulitis. LHC 08/20/11: LAD proximal 100%, circumflex proximal sub 100%, mid RCA 40%, mid PDA 80-90%, proximal PLSA 90%, proximal SVG-RCA 100%, proximal SVG-diagonal 100%, SVG-OM patent, LIMA-LAD patent, EF 40%. No lesions were amenable to PCI. Medical therapy was recommended. ACE inhibitor was avoided given renal insufficiency. He had some difficulty with hypoxia. This improved with diuresis. He was treated with antibiotics for leg cellulitis. Hemoglobin was noted to drop slightly but overall remained stable. Iron levels were noted to be low. He was asked to followup with primary care.  Since d/c, he has run out of his medications.  Notes worsening DOE.  Class 3.  No chest pain.  No syncope.  Admits to 3 pillow orthopnea as well as PND.  Using his wife's O2 at night.  Ran out of BP meds several days ago.  He cannot afford.    Wt Readings from Last 3 Encounters:  09/21/11 208 lb 6.4 oz (94.53 kg)  08/23/11 229 lb 8 oz (104.1 kg)  08/23/11 229 lb 8 oz (104.1 kg)     Past Medical History  Diagnosis Date  . HTN (hypertension)   . HLD (hyperlipidemia)   . Benign prostatic hypertrophy     hx of  . Diabetes mellitus, type 2   . Osteoarthrosis, unspecified whether generalized or localized, shoulder region   . History of tobacco abuse     quit  1992  . CAD (coronary artery disease)     4v CABG (1/11): LIMA LAD, SVG-D, SVG-OM, SVG-PLV. ; NSTEMI 08/2011 cath  revealed severe native CAD, patent SVG to OM & LIMA to LAD, occluded SVG to RCA and SVG to Diagonal, EF 40%, no culprit lesion for PCI, med therapy  . CVA (cerebral infarction)     peri-catheterization in 12/10.  Patient initially developed double vision and MRI showed right dorsal mid-brain infarct.  Carotid dopplers with no significant disease.  Double vision has resolved.    . Systolic and diastolic CHF, acute on chronic     EF 40% by cath 08/2011  . Bladder cancer     s/p resection 7/11  . PVC's (premature ventricular contractions)     3 week  event monitor 8/11: no signifiant arrhythmias, occ PVCs  . Iron deficiency anemia   . Leg wound, left 08/2011  . Obesity      Past Surgical History  Procedure Date  . Appendectomy   . Transurethral resection of prostate   . Coronary artery bypass graft 2011  . Retinal detachment surgery   . Cataract surg       Current Outpatient Prescriptions  Medication Sig Dispense Refill  . amLODipine (NORVASC) 10 MG tablet Take 10 mg by mouth daily.      Marland Kitchen aspirin EC 81 MG tablet Take 81 mg by mouth daily.      Marland Kitchen  atorvastatin (LIPITOR) 80 MG tablet Take 1 tablet (80 mg total) by mouth at bedtime.  30 tablet  6  . carvedilol (COREG) 12.5 MG tablet Take 12.5 mg by mouth 2 (two) times daily with a meal.      . doxazosin (CARDURA) 8 MG tablet Take 1 tablet (8 mg total) by mouth at bedtime.  90 tablet  3  . furosemide (LASIX) 40 MG tablet Take 1 tablet (40 mg total) by mouth daily.  30 tablet  6  . glipiZIDE (GLUCOTROL) 5 MG tablet Take 5 mg by mouth daily.      . insulin glargine (LANTUS) 100 UNIT/ML injection Inject 30 Units into the skin at bedtime.  10 mL    . metFORMIN (GLUCOPHAGE) 500 MG tablet Take 500 mg by mouth 2 (two) times daily with a meal.       . naproxen (NAPROSYN) 375 MG tablet Take 375 mg by mouth every 12 (twelve) hours.        . nitroGLYCERIN (NITROSTAT) 0.4 MG SL tablet Place 0.4 mg under the tongue every 5 (five) minutes as needed. For chest pain      . omega-3 acid ethyl esters (LOVAZA) 1 G capsule Take 1 g by mouth daily.      . potassium chloride SA (K-DUR,KLOR-CON) 20 MEQ tablet Take 1 tablet (20 mEq total) by mouth daily. Take 2 tablets in the morning and 1 tablet in the afternoon.  30 tablet  6  . traMADol (ULTRAM) 50 MG tablet Take 50 mg by mouth every 6 (six) hours as needed. For pain      . vitamin B-12 (CYANOCOBALAMIN) 50 MCG tablet Take 50 mcg by mouth daily.        Allergies: Allergies  Allergen Reactions  . Niacin Itching    History  Substance Use Topics  . Smoking status: Former Smoker -- 50 years    Quit date: 04/03/1990  . Smokeless tobacco: Not on file  . Alcohol Use: Yes     occasionally     Family History  Problem Relation Age of Onset  . Other      Father died at age 68 from an embolic cerebrovascular accident from a deep vein thrombosis in his lower extremity.  . Osteoarthritis Sister      ROS:  Please see the history of present illness.    All other systems reviewed and negative.   PHYSICAL EXAM: VS:  BP 198/108  Pulse 86  Ht 5' 11.5" (1.816 m)  Wt 208 lb 6.4 oz (94.53 kg)  BMI 28.66 kg/m2 O2 96% on RA Well nourished, well developed, in no acute distress HEENT: normal Neck: no JVD Cardiac:  normal S1, S2; RRR; no murmur; S3 Lungs:  clear to auscultation bilaterally, no wheezing, rhonchi or rales Abd: soft, nontender, no hepatomegaly Ext: trace to 1+ bilateral LE edema Skin: warm and dry Neuro:  CNs 2-12 intact, no focal abnormalities noted  EKG:  NSR, HR 86, RBBB      ASSESSMENT AND PLAN:  1. Acute on Chronic Combined Systolic and Diastolic CHF in setting of Hypertensive Urgency:  Volume status is not that bad.  But in setting of BP out of control and inability to get medications, his status is likely to worsen quickly.  Concerned he would progress quickly to  fulminant pulmonary edema and require emergent care.  Discussed with Dr.  Shawnie Pons (DOD).  Will admit to [facility] for IV diuresis and BP control.  2. Coronary Artery Disease:  No angina.  He is not taking ASA consistently.  Cycle enzymes.    3. Hypertension:  Restart coreg, amlodipine, cardura.  Add ACE if renal fxn will allow.  4. Hyperlipidemia:  Restart Lipitor.  Consider d/c on Pravastatin for cost.  5. Chronic Kidney Disease:  Monitor renal fxn.  6. Anemia:  Monitor H/H.  7. Disp:  Patient will need to see case management for help with medications so that he can obtain all medications on d/c.   Signed, Tereso Newcomer, PA-C  3:32 PM 09/21/2011

## 2011-09-21 NOTE — H&P (Signed)
Admission History and Physical  Date:  09/21/2011   Name:  Joshua Vincent   DOB:  15-Sep-1934   MRN:  [id number]  PCP:  Benita Stabile, MD  Primary Cardiologist:  Dr. Marca Ancona  Primary Electrophysiologist:  None    History of Present Illness: Joshua Vincent is a 77 y.o. male who returns for post hospital follow up.  He has a history of CAD s/p NSTEMI in 12/10 accompanied by pulmonary edema and peri-catheterization CVA, now s/p CABG.   He was admitted 8/11-8/15 with a non-STEMI complicated by acute on chronic combined systolic and diastolic CHF and lower extremity cellulitis. LHC 08/20/11: LAD proximal 100%, circumflex proximal sub 100%, mid RCA 40%, mid PDA 80-90%, proximal PLSA 90%, proximal SVG-RCA 100%, proximal SVG-diagonal 100%, SVG-OM patent, LIMA-LAD patent, EF 40%. No lesions were amenable to PCI. Medical therapy was recommended. ACE inhibitor was avoided given renal insufficiency. He had some difficulty with hypoxia. This improved with diuresis. He was treated with antibiotics for leg cellulitis. Hemoglobin was noted to drop slightly but overall remained stable. Iron levels were noted to be low. He was asked to followup with primary care.  Since d/c, he has run out of his medications.  Notes worsening DOE.  Class 3.  No chest pain.  No syncope.  Admits to 3 pillow orthopnea as well as PND.  Using his wife's O2 at night.  Ran out of BP meds several days ago.  He cannot afford.    Wt Readings from Last 3 Encounters:  09/21/11 208 lb 6.4 oz (94.53 kg)  08/23/11 229 lb 8 oz (104.1 kg)  08/23/11 229 lb 8 oz (104.1 kg)     Past Medical History  Diagnosis Date  . HTN (hypertension)   . HLD (hyperlipidemia)   . Benign prostatic hypertrophy     hx of  . Diabetes mellitus, type 2   . Osteoarthrosis, unspecified whether generalized or localized, shoulder region   . History of tobacco abuse     quit 1992  . CAD (coronary artery disease)     4v CABG (1/11): LIMA LAD, SVG-D,  SVG-OM, SVG-PLV. ; NSTEMI 08/2011 cath  revealed severe native CAD, patent SVG to OM & LIMA to LAD, occluded SVG to RCA and SVG to Diagonal, EF 40%, no culprit lesion for PCI, med therapy  . CVA (cerebral infarction)     peri-catheterization in 12/10.  Patient initially developed double vision and MRI showed right dorsal mid-brain infarct.  Carotid dopplers with no significant disease.  Double vision has resolved.    . Systolic and diastolic CHF, acute on chronic     EF 40% by cath 08/2011  . Bladder cancer     s/p resection 7/11  . PVC's (premature ventricular contractions)     3 week  event monitor 8/11: no signifiant arrhythmias, occ PVCs  . Iron deficiency anemia   . Leg wound, left 08/2011  . Obesity      Past Surgical History  Procedure Date  . Appendectomy   . Transurethral resection of prostate   . Coronary artery bypass graft 2011  . Retinal detachment surgery   . Cataract surg       Current Outpatient Prescriptions  Medication Sig Dispense Refill  . amLODipine (NORVASC) 10 MG tablet Take 10 mg by mouth daily.      Marland Kitchen aspirin EC 81 MG tablet Take 81 mg by mouth daily.      Marland Kitchen atorvastatin (LIPITOR) 80 MG tablet Take 1  tablet (80 mg total) by mouth at bedtime.  30 tablet  6  . carvedilol (COREG) 12.5 MG tablet Take 12.5 mg by mouth 2 (two) times daily with a meal.      . doxazosin (CARDURA) 8 MG tablet Take 1 tablet (8 mg total) by mouth at bedtime.  90 tablet  3  . furosemide (LASIX) 40 MG tablet Take 1 tablet (40 mg total) by mouth daily.  30 tablet  6  . glipiZIDE (GLUCOTROL) 5 MG tablet Take 5 mg by mouth daily.      . insulin glargine (LANTUS) 100 UNIT/ML injection Inject 30 Units into the skin at bedtime.  10 mL    . metFORMIN (GLUCOPHAGE) 500 MG tablet Take 500 mg by mouth 2 (two) times daily with a meal.       . naproxen (NAPROSYN) 375 MG tablet Take 375 mg by mouth every 12 (twelve) hours.      . nitroGLYCERIN (NITROSTAT) 0.4 MG SL tablet Place 0.4 mg under the tongue  every 5 (five) minutes as needed. For chest pain      . omega-3 acid ethyl esters (LOVAZA) 1 G capsule Take 1 g by mouth daily.      . potassium chloride SA (K-DUR,KLOR-CON) 20 MEQ tablet Take 1 tablet (20 mEq total) by mouth daily. Take 2 tablets in the morning and 1 tablet in the afternoon.  30 tablet  6  . traMADol (ULTRAM) 50 MG tablet Take 50 mg by mouth every 6 (six) hours as needed. For pain      . vitamin B-12 (CYANOCOBALAMIN) 50 MCG tablet Take 50 mcg by mouth daily.        Allergies: Allergies  Allergen Reactions  . Niacin Itching    History  Substance Use Topics  . Smoking status: Former Smoker -- 50 years    Quit date: 04/03/1990  . Smokeless tobacco: Not on file  . Alcohol Use: Yes     occasionally     Family History  Problem Relation Age of Onset  . Other      Father died at age 44 from an embolic cerebrovascular accident from a deep vein thrombosis in his lower extremity.  . Osteoarthritis Sister      ROS:  Please see the history of present illness.    All other systems reviewed and negative.   PHYSICAL EXAM: VS:  BP 198/108  Pulse 86  Ht 5' 11.5" (1.816 m)  Wt 208 lb 6.4 oz (94.53 kg)  BMI 28.66 kg/m2 O2 96% on RA Well nourished, well developed, in no acute distress HEENT: normal Neck: no JVD Cardiac:  normal S1, S2; RRR; no murmur; S3 Lungs:  clear to auscultation bilaterally, no wheezing, rhonchi or rales Abd: soft, nontender, no hepatomegaly Ext: trace to 1+ bilateral LE edema Skin: warm and dry Neuro:  CNs 2-12 intact, no focal abnormalities noted  EKG:  NSR, HR 86, RBBB      ASSESSMENT AND PLAN:  1. Acute on Chronic Combined Systolic and Diastolic CHF in setting of Hypertensive Urgency:  Volume status is not that bad.  But in setting of BP out of control and inability to get medications, his status is likely to worsen quickly.  Concerned he would progress quickly to fulminant pulmonary edema and require emergent care.  Discussed with Dr.   Shawnie Pons (DOD).  Will admit to [facility] for IV diuresis and BP control.  2. Coronary Artery Disease:  No angina.  He is not taking  ASA consistently.  Cycle enzymes.    3. Hypertension:  Restart coreg, amlodipine, cardura.  Add ACE if renal fxn will allow.  4. Hyperlipidemia:  Restart Lipitor.  Consider d/c on Pravastatin for cost.  5. Chronic Kidney Disease:  Monitor renal fxn.  6. Anemia:  Monitor H/H.  7. Dispo:  Patient will need to see case management for help with medications so that he can obtain all medications on d/c.   Signed, Tereso Newcomer, PA-C  3:32 PM 09/21/2011     Patient seen and examined with Tereso Newcomer, PAC. He has increasing orthopnea as well as severe hypertension in combination with inability to afford any of his meds.  He presents today with clinical evidence of volume overload.  He will require admission to the hospital for adequate management.  Why he is unable to afford $4 medicines is unclear, but will not get resolved today in the clinic.  He will need case management involvement if we hope to avoid 30-day readmission.  I concur with the history and physical as recorded.

## 2011-09-22 ENCOUNTER — Inpatient Hospital Stay (HOSPITAL_COMMUNITY): Payer: Managed Care, Other (non HMO)

## 2011-09-22 DIAGNOSIS — I169 Hypertensive crisis, unspecified: Secondary | ICD-10-CM

## 2011-09-22 DIAGNOSIS — I059 Rheumatic mitral valve disease, unspecified: Secondary | ICD-10-CM

## 2011-09-22 DIAGNOSIS — I5042 Chronic combined systolic (congestive) and diastolic (congestive) heart failure: Secondary | ICD-10-CM

## 2011-09-22 DIAGNOSIS — I10 Essential (primary) hypertension: Secondary | ICD-10-CM

## 2011-09-22 DIAGNOSIS — I5043 Acute on chronic combined systolic (congestive) and diastolic (congestive) heart failure: Secondary | ICD-10-CM

## 2011-09-22 LAB — GLUCOSE, CAPILLARY
Glucose-Capillary: 63 mg/dL — ABNORMAL LOW (ref 70–99)
Glucose-Capillary: 82 mg/dL (ref 70–99)
Glucose-Capillary: 214 mg/dL — ABNORMAL HIGH (ref 70–99)

## 2011-09-22 LAB — BASIC METABOLIC PANEL
CO2: 29 mEq/L (ref 19–32)
GFR calc non Af Amer: 47 mL/min — ABNORMAL LOW (ref >90)
Glucose, Bld: 100 mg/dL — ABNORMAL HIGH (ref 70–99)
Potassium: 3 mEq/L — ABNORMAL LOW (ref 3.5–5.1)
Sodium: 140 mEq/L (ref 135–145)

## 2011-09-22 LAB — TROPONIN I: Troponin I: <0.3 ng/mL (ref <0.30)

## 2011-09-22 MED ORDER — INSULIN GLARGINE 100 UNIT/ML ~~LOC~~ SOLN
20.0000 [IU] | Freq: Every day | SUBCUTANEOUS | Status: DC
  Administered 2011-09-22: 20 [IU] via SUBCUTANEOUS

## 2011-09-22 MED ORDER — CARVEDILOL 25 MG PO TABS
25.0000 mg | ORAL_TABLET | Freq: Two times a day (BID) | ORAL | Status: DC
  Administered 2011-09-22 – 2011-09-25 (×6): 25 mg via ORAL
  Filled 2011-09-22 (×8): qty 1

## 2011-09-22 MED ORDER — NON FORMULARY: 40.0000 mg | Freq: Every day | Status: DC

## 2011-09-22 MED ORDER — POTASSIUM CHLORIDE CRYS ER 20 MEQ PO TBCR
40.0000 meq | EXTENDED_RELEASE_TABLET | Freq: Once | ORAL | Status: AC
  Administered 2011-09-22: 40 meq via ORAL

## 2011-09-22 MED ORDER — PRAVASTATIN SODIUM 40 MG PO TABS
40.0000 mg | ORAL_TABLET | Freq: Every day | ORAL | Status: DC
  Administered 2011-09-23: 40 mg via ORAL
  Filled 2011-09-22 (×2): qty 1

## 2011-09-22 NOTE — Progress Notes (Addendum)
Hypoglycemic Event  CBG: 63  Treatment: orange juice and graham crackers  Symptoms: none  Follow-up CBG: Time: 1215 CBG Result: 82  Possible Reasons for Event: possible med adjustment  Comments/MD notified: Bensihmone    Maurilio Lovely M  Remember to initiate Hypoglycemia Order Set & complete

## 2011-09-22 NOTE — Progress Notes (Signed)
  Echocardiogram 2D Echocardiogram has been performed.  Joshua Vincent 09/22/2011, 4:13 PM

## 2011-09-22 NOTE — Progress Notes (Addendum)
Subjective:  Joshua Vincent is a 77 yo male with h/o CAD s/p NSTEMI in 12/10 accompanied by pulmonary edema and peri-catheterization CVA, now s/p CABG. He was admitted 8/11-8/15 with a non-STEMI complicated by acute on chronic combined systolic and diastolic CHF and lower extremity cellulitis. LHC 08/20/11: LAD proximal 100%, circumflex proximal sub 100%, mid RCA 40%, mid PDA 80-90%, proximal PLSA 90%, proximal SVG-RCA 100%, proximal SVG-diagonal 100%, SVG-OM patent, LIMA-LAD patent, EF 40%. No lesions were amenable to PCI. Medical therapy was recommended. ACE inhibitor was avoided given renal insufficiency. He had some difficulty with hypoxia. This improved with diuresis. He was treated with antibiotics for leg cellulitis.   Readmitted from the office on 9/13 with a/c HF in setting of HTN crisis after running out of his meds. (BP 220/100)  He has diuresed about 2.5L. Breathing better. BP now 145/75 after restarting meds. Still on IV NTG. CBGs low this am in 60s.   Intake/Output Summary (Last 24 hours) at 09/22/11 1207 Last data filed at 09/22/11 0906  Gross per 24 hour  Intake   27.6 ml  Output   1400 ml  Net -1372.4 ml    Current meds:    . amLODipine  10 mg Oral Daily  . aspirin EC  81 mg Oral Daily  . atorvastatin  80 mg Oral QHS  . carvedilol  12.5 mg Oral BID WC  . doxazosin  8 mg Oral QHS  . enoxaparin  40 mg Subcutaneous Q24H  . furosemide  40 mg Intravenous BID  . glipiZIDE  5 mg Oral QAC breakfast  . insulin aspart  0-15 Units Subcutaneous TID WC  . insulin glargine  30 Units Subcutaneous QHS  . metFORMIN  500 mg Oral BID WC  . omega-3 acid ethyl esters  1 g Oral Daily  . potassium chloride  20 mEq Oral BID  . sodium chloride  3 mL Intravenous Q12H  . vitamin B-12  50 mcg Oral Daily  . DISCONTD: carvedilol  12.5 mg Oral BID WC   Infusions:    . nitroGLYCERIN 20 mcg/min (09/21/11 2314)     Objective:  Blood pressure 151/74, pulse 72, temperature 97.8 F (36.6 C),  temperature source Oral, resp. rate 18, height 6' (1.829 m), weight 93.985 kg (207 lb 3.2 oz), SpO2 97.00%. Weight change:   Physical Exam: General:  Well appearing. No resp difficulty HEENT: normal Neck: supple. JVP 5 . Carotids 2+ bilat; no bruits. No lymphadenopathy or thryomegaly appreciated. Cor: PMI nondisplaced. Regular rate & rhythm. No rubs, gallops or murmurs. Lungs: clear Abdomen: soft, nontender, nondistended. No hepatosplenomegaly. No bruits or masses. Good bowel sounds. Extremities: no cyanosis, clubbing, rash, edema. LLE wrapped due to ulcers Neuro: alert & orientedx3, cranial nerves grossly intact. moves all 4 extremities w/o difficulty. Affect pleasant  Telemetry: SR  Lab Results: Basic Metabolic Panel:  Lab 09/22/11 8413 09/21/11 1805  NA 140 142  K 3.0* 3.2*  CL 103 104  CO2 29 28  GLUCOSE 100* 149*  BUN 16 14  CREATININE 1.43* 1.26  CALCIUM 8.9 9.7  MG -- --  PHOS -- --   Liver Function Tests:  Lab 09/21/11 1805  AST 14  ALT 12  ALKPHOS 108  BILITOT 0.4  PROT 6.9  ALBUMIN 3.1*   No results found for this basename: LIPASE:5,AMYLASE:5 in the last 168 hours No results found for this basename: AMMONIA:5 in the last 168 hours CBC:  Lab 09/21/11 1805  WBC 6.1  NEUTROABS 3.4  HGB 13.9  HCT 41.0  MCV 80.6  PLT 207   Cardiac Enzymes:  Lab 09/22/11 0455 09/21/11 2350 09/21/11 1803  CKTOTAL -- -- --  CKMB -- -- --  CKMBINDEX -- -- --  TROPONINI <0.30 <0.30 <0.30   BNP: No components found with this basename: POCBNP:5 CBG:  Lab 09/22/11 1149 09/22/11 0632  GLUCAP 63* 91   Microbiology: No results found for this basename: cult   No results found for this basename: CULT:2,SDES:2 in the last 168 hours  Imaging: Dg Chest 2 View  09/22/2011  *RADIOLOGY REPORT*  Clinical Data: 77 year old male with CHF hypertension myocardial infarction, heart failure, pulmonary edema.  CHEST - 2 VIEW  Comparison: 08/21/2011 and earlier.  Findings: Mildly  improved lung volumes and decreased bibasilar streaky opacity.  Small pleural effusion remaining.  No overt pulmonary edema.  Cardiac size and mediastinal contours are within normal limits.  Sequelae of CABG. Visualized tracheal air column is within normal limits.  Stable visualized osseous structures.  IMPRESSION: Decreased bilateral streaky opacity/atelectasis.  Small pleural effusions persist.   Original Report Authenticated By: Harley Hallmark, M.D.      ASSESSMENT:  1. Acute on Chronic Combined Systolic and Diastolic CHF in setting of Hypertensive Urgency:  2. Coronary Artery Disease 3. Hypertension 4. Hyperlipidemia 5. Chronic Kidney Disease 6. DM2 7. Inability to afford medicines  8. Hypokalemia  PLAN/DISCUSSION:  Volume status and BP much improved. Will stop IV NTG. Continue to adjust oral meds. Hold lasix for now. Will decrease lantus given hypokalemia. I had long discussion with him about his meds and he is unable to afford even his $4 meds. Will need pharmacy and case manager to see to help with meds before we can send him home. I will try to change his regimen to get meds as cheap as possible.    LOS: 1 day    Arvilla Meres, MD 09/22/2011, 12:07 PM

## 2011-09-23 DIAGNOSIS — E119 Type 2 diabetes mellitus without complications: Secondary | ICD-10-CM

## 2011-09-23 LAB — BASIC METABOLIC PANEL
GFR calc Af Amer: 52 mL/min — ABNORMAL LOW (ref >90)
GFR calc non Af Amer: 44 mL/min — ABNORMAL LOW (ref >90)
Potassium: 3.2 mEq/L — ABNORMAL LOW (ref 3.5–5.1)
Sodium: 140 mEq/L (ref 135–145)

## 2011-09-23 LAB — GLUCOSE, CAPILLARY: Glucose-Capillary: 62 mg/dL — ABNORMAL LOW (ref 70–99)

## 2011-09-23 MED ORDER — FUROSEMIDE 40 MG PO TABS
40.0000 mg | ORAL_TABLET | Freq: Two times a day (BID) | ORAL | Status: DC
  Administered 2011-09-23 – 2011-09-25 (×4): 40 mg via ORAL
  Filled 2011-09-23 (×6): qty 1

## 2011-09-23 MED ORDER — COLLAGENASE 250 UNIT/GM EX OINT
TOPICAL_OINTMENT | Freq: Every day | CUTANEOUS | Status: DC
  Administered 2011-09-23 – 2011-09-25 (×3): via TOPICAL
  Filled 2011-09-23 (×2): qty 30

## 2011-09-23 MED ORDER — INSULIN GLARGINE 100 UNIT/ML ~~LOC~~ SOLN
10.0000 [IU] | Freq: Once | SUBCUTANEOUS | Status: AC
  Administered 2011-09-23: 10 [IU] via SUBCUTANEOUS

## 2011-09-23 MED ORDER — SIMVASTATIN 40 MG PO TABS
40.0000 mg | ORAL_TABLET | Freq: Every day | ORAL | Status: DC
  Administered 2011-09-23: 40 mg via ORAL
  Filled 2011-09-23 (×2): qty 1

## 2011-09-23 NOTE — Progress Notes (Signed)
   CARE MANAGEMENT NOTE 09/23/2011  Patient:  WATT, GEILER   Account Number:  [id number]  Date Initiated:  09/23/2011  Documentation initiated by:  [facility]  Subjective/Objective Assessment:   CHF     Action/Plan:   lives at home with wife, dtr has concerns with pt being compliant with medications and care   Anticipated DC Date:     Anticipated DC Plan:  HOME W HOME HEALTH SERVICES      DC Planning Services  CM consult      [facility] Choice  HOME HEALTH  Resumption Of Svcs/PTA Provider   Choice offered to / List presented to:  C-1 Patient           [facility] agency  [facility]   Status of service:  In process, will continue to follow Medicare Important Message given?   (If response is "NO", the following Medicare IM given date fields will be blank) Date Medicare IM given:   Date Additional Medicare IM given:    Discharge Disposition:    Per UR Regulation:    If discussed at Long Length of Stay Meetings, dates discussed:    Comments:  09/23/2011 1330 Spoke to pt and states he is having difficulty with paying for his meds copay. States his check was a month late and it through all his payments off. He does have drug coverage for medication, but he states he has 20 Rx. NCM provided pt with community resources that may assist with medications such as [employer], [employer] or [employer]. Pt not eligible for [facility] fund due to drug coverage with his insurance. Pt states he was active with [facility] prior to admission. Contacted [facility] to make aware of pt's admission and left message for weekday rep for follow up. Will need a resumption of care order at d/c for [facility]. Isidoro Donning RN CCM Case Mgmt phone [phone]

## 2011-09-23 NOTE — Progress Notes (Signed)
Pt had CBG 63, gave OJ and gram crackers and PB, rechecked CBG went back to 96, will continue to monitor, Thanks, Lavonda Jumbo RN

## 2011-09-23 NOTE — Consult Note (Signed)
WOC consult Note Reason for Consult: requested to eval LE wounds. 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  He has been treated by his medical MD for these wounds with silvadene.  He does have a palpable pulse and some slight LE edema noted in the LLE.  Wound type: trauma LLE  Measurement: Pt has 3 areas that are in need of debridement, however the necrotic tissue is adherent today, so I am not able to remove at the bedside today. Left lateral proximal: 3cm x 6cm x 0.2cm  Left lateral distal: 4cm x 5.5cm x 0.2cm  Left anterior malleolar region: 2.5cm x 0.5cm x 0.2cm   Wound bed: Left lateral proximal: 100% necrotic, soft yellow and black slough Left lateral distal: 100% necrotic, soft yellow and black slough Left anterior malleolar region: 75% yellow slough  Drainage (amount, consistency, odor) minimal dark brown old blood, and no odor  Periwound: intact, but some scarring noted and surround erythema   Dressing procedure/placement/frequency: Will order enzymatic debridement ointment daily to all wounds to clean of necrotic tissue, then cover with non adherent, wrap with kerlix. Apply and change daily.  Ace wrap. Would recommend follow up in outpatient wound care center for monitoring and continuation of wound care services.   Re consult if needed, will not follow at this time. Thanks  Keyosha Tiedt [employer], CWOCN [phone])

## 2011-09-23 NOTE — Progress Notes (Addendum)
Subjective:  [name] is a 77 yo male with h/o CAD s/p NSTEMI in 12/10 accompanied by pulmonary edema and peri-catheterization CVA, now s/p CABG. He was admitted 8/11-8/15 with a non-STEMI complicated by acute on chronic combined systolic and diastolic CHF and lower extremity cellulitis. LHC 08/20/11: LAD proximal 100%, circumflex proximal sub 100%, mid RCA 40%, mid PDA 80-90%, proximal PLSA 90%, proximal SVG-RCA 100%, proximal SVG-diagonal 100%, SVG-OM patent, LIMA-LAD patent, EF 40%. No lesions were amenable to PCI. Medical therapy was recommended. ACE inhibitor was avoided given renal insufficiency. He had some difficulty with hypoxia. This improved with diuresis. He was treated with antibiotics for leg cellulitis.   Readmitted from the office on 9/13 with a/c HF in setting of HTN crisis after running out of his meds. (BP 220/100)  He has diuresed about 2.5L. But weight unchanged.  Breathing better. BP now 145/75 after restarting meds. CBGs low yesterday so Lantus cut back.  Hypoglycemic again today.  Still has not been seen by case manager.     Intake/Output Summary (Last 24 hours) at 09/23/11 1020 Last data filed at 09/23/11 0954  Gross per 24 hour  Intake   1123 ml  Output   2550 ml  Net  -1427 ml    Current meds:    . amLODipine  10 mg Oral Daily  . aspirin EC  81 mg Oral Daily  . carvedilol  25 mg Oral BID WC  . doxazosin  8 mg Oral QHS  . enoxaparin  40 mg Subcutaneous Q24H  . glipiZIDE  5 mg Oral QAC breakfast  . insulin aspart  0-15 Units Subcutaneous TID WC  . insulin glargine  20 Units Subcutaneous QHS  . metFORMIN  500 mg Oral BID WC  . potassium chloride  20 mEq Oral BID  . potassium chloride  40 mEq Oral Once  . pravastatin  40 mg Oral Daily  . sodium chloride  3 mL Intravenous Q12H  . vitamin B-12  50 mcg Oral Daily  . DISCONTD: atorvastatin  80 mg Oral QHS  . DISCONTD: carvedilol  12.5 mg Oral BID WC  . DISCONTD: furosemide  40 mg Intravenous BID  .  DISCONTD: insulin glargine  30 Units Subcutaneous QHS  . DISCONTD: NON FORMULARY 40 mg  40 mg Oral Daily  . DISCONTD: omega-3 acid ethyl esters  1 g Oral Daily   Infusions:    . DISCONTD: nitroGLYCERIN 20 mcg/min (09/21/11 2314)     Objective:  Blood pressure 144/73, pulse 76, temperature 98 F (36.7 C), temperature source Oral, resp. rate 18, height 6' (1.829 m), weight 95.029 kg (209 lb 8 oz), SpO2 98.00%. Weight change: 0.129 kg (4.5 oz)  Physical Exam: General:  Well appearing. No resp difficulty HEENT: normal Neck: supple. JVP 5 . Carotids 2+ bilat; no bruits. No lymphadenopathy or thryomegaly appreciated. Cor: PMI nondisplaced. Regular rate & rhythm. No rubs, gallops or murmurs. Lungs: clear Abdomen: soft, nontender, nondistended. No hepatosplenomegaly. No bruits or masses. Good bowel sounds. Extremities: no cyanosis, clubbing, rash, edema. LLE wrapped due to ulcers Neuro: alert & orientedx3, cranial nerves grossly intact. moves all 4 extremities w/o difficulty. Affect pleasant  Telemetry: SR  Lab Results: Basic Metabolic Panel:  Lab 09/23/11 1610 09/22/11 0455 09/21/11 1805  NA 140 140 142  K 3.2* 3.0* --  CL 104 103 104  CO2 30 29 28   GLUCOSE 94 100* 149*  BUN 16 16 14   CREATININE 1.49* 1.43* 1.26  CALCIUM 9.2 8.9 9.7  MG -- -- --  PHOS -- -- --   Liver Function Tests:  Lab 09/21/11 1805  AST 14  ALT 12  ALKPHOS 108  BILITOT 0.4  PROT 6.9  ALBUMIN 3.1*   No results found for this basename: LIPASE:5,AMYLASE:5 in the last 168 hours No results found for this basename: AMMONIA:5 in the last 168 hours CBC:  Lab 09/21/11 1805  WBC 6.1  NEUTROABS 3.4  HGB 13.9  HCT 41.0  MCV 80.6  PLT 207   Cardiac Enzymes:  Lab 09/22/11 0455 09/21/11 2350 09/21/11 1803  CKTOTAL -- -- --  CKMB -- -- --  CKMBINDEX -- -- --  TROPONINI <0.30 <0.30 <0.30   BNP: No components found with this basename: POCBNP:5 CBG:  Lab 09/23/11 0639 09/23/11 0601 09/22/11 2122  09/22/11 1639 09/22/11 1215  GLUCAP 96 62* 159* 214* 82   Microbiology: No results found for this basename: cult   No results found for this basename: CULT:2,SDES:2 in the last 168 hours  Imaging: Dg Chest 2 View  09/22/2011  *RADIOLOGY REPORT*  Clinical Data: 77 year old male with CHF hypertension myocardial infarction, heart failure, pulmonary edema.  CHEST - 2 VIEW  Comparison: 08/21/2011 and earlier.  Findings: Mildly improved lung volumes and decreased bibasilar streaky opacity.  Small pleural effusion remaining.  No overt pulmonary edema.  Cardiac size and mediastinal contours are within normal limits.  Sequelae of CABG. Visualized tracheal air column is within normal limits.  Stable visualized osseous structures.  IMPRESSION: Decreased bilateral streaky opacity/atelectasis.  Small pleural effusions persist.   Original Report Authenticated By: Harley Hallmark, M.D.      ASSESSMENT:  1. Acute on Chronic Combined Systolic and Diastolic CHF in setting of Hypertensive Urgency:  2. Coronary Artery Disease 3. Hypertension 4. Hyperlipidemia 5. Chronic Kidney Disease 6. DM2 7. Inability to afford medicines  8. Hypokalemia  PLAN/DISCUSSION:  Volume status and BP much improved. I have adjusted oral meds to make as cheap as possible. Resume po lasix.    Having recurrent hypoglycemia will stop Lantus. Can increase glipizide as need if CBGs go up.   Will ask wound care to see to change dressing.    I had long discussion with him about his meds and he is unable to afford even his $4 meds. Will need pharmacy and case manager to see to help with meds before we can send him home. Hopefully can d/c in am. Family requesting continued wound care and HHRN at d/c.   LOS: 2 days    Arvilla Meres, MD 09/23/2011, 10:20 AM

## 2011-09-24 LAB — GLUCOSE, CAPILLARY
Glucose-Capillary: 297 mg/dL — ABNORMAL HIGH (ref 70–99)
Glucose-Capillary: 158 mg/dL — ABNORMAL HIGH (ref 70–99)
Glucose-Capillary: 165 mg/dL — ABNORMAL HIGH (ref 70–99)

## 2011-09-24 LAB — BASIC METABOLIC PANEL
BUN: 18 mg/dL (ref 6–23)
CO2: 31 mEq/L (ref 19–32)
Chloride: 101 mEq/L (ref 96–112)
Creatinine, Ser: 1.53 mg/dL — ABNORMAL HIGH (ref 0.50–1.35)
GFR calc Af Amer: 50 mL/min — ABNORMAL LOW (ref >90)
Glucose, Bld: 202 mg/dL — ABNORMAL HIGH (ref 70–99)
Potassium: 4 mEq/L (ref 3.5–5.1)

## 2011-09-24 MED ORDER — ATORVASTATIN CALCIUM 80 MG PO TABS
80.0000 mg | ORAL_TABLET | Freq: Every day | ORAL | Status: DC
  Administered 2011-09-24: 80 mg via ORAL
  Filled 2011-09-24 (×2): qty 1

## 2011-09-24 MED ORDER — GLIPIZIDE 5 MG PO TABS
5.0000 mg | ORAL_TABLET | Freq: Two times a day (BID) | ORAL | Status: DC
  Administered 2011-09-24 – 2011-09-25 (×2): 5 mg via ORAL
  Filled 2011-09-24 (×4): qty 1

## 2011-09-24 NOTE — Progress Notes (Signed)
Pt's CBG 297 tonight, called on call MD gave order for Lantus 10 units, will continue to monitor, Thanks, Lavonda Jumbo RN

## 2011-09-24 NOTE — Progress Notes (Signed)
O2 sat on RA at rest- 98%   O2 sat on RA while ambulating/during exertion- 95%   O2 sat on RA at end of ambulation while at rest was 98%. Patient started to have increased respiration rating at 25 and complaining that he could not catch his breath. Oxygen was again placed on patient at 3L for comfort.

## 2011-09-24 NOTE — Progress Notes (Signed)
Patient ID: Joshua Vincent, male   DOB: 06-25-1934, 77 y.o.   MRN: [id number]    Subjective:  Joshua Vincent is a 77 yo male with h/o CAD s/p NSTEMI in 12/10 accompanied by pulmonary edema and peri-catheterization CVA, now s/p CABG. He was admitted 8/11-8/15 with a non-STEMI complicated by acute on chronic combined systolic and diastolic CHF and lower extremity cellulitis. LHC 08/20/11: LAD proximal 100%, circumflex proximal sub 100%, mid RCA 40%, mid PDA 80-90%, proximal PLSA 90%, proximal SVG-RCA 100%, proximal SVG-diagonal 100%, SVG-OM patent, LIMA-LAD patent, EF 40%. No lesions were amenable to PCI. Medical therapy was recommended. ACE inhibitor was avoided given renal insufficiency. He had some difficulty with hypoxia. This improved with diuresis. He was treated with antibiotics for leg cellulitis.   Readmitted from the office on 9/13 with a/c HF in setting of HTN crisis after running out of his meds. (BP 220/100)  He was initially diuresed with IV Lasix, now on po.  Creatinine has risen to 1.5.  Still short of breath with exertion and orthopneic.  BP better but still high.     Intake/Output Summary (Last 24 hours) at 09/24/11 0820 Last data filed at 09/24/11 0050  Gross per 24 hour  Intake    883 ml  Output    550 ml  Net    333 ml    Current meds:    . amLODipine  10 mg Oral Daily  . aspirin EC  81 mg Oral Daily  . carvedilol  25 mg Oral BID WC  . collagenase   Topical Daily  . doxazosin  8 mg Oral QHS  . enoxaparin  40 mg Subcutaneous Q24H  . furosemide  40 mg Oral BID  . glipiZIDE  5 mg Oral QAC breakfast  . insulin aspart  0-15 Units Subcutaneous TID WC  . insulin glargine  10 Units Subcutaneous Once  . metFORMIN  500 mg Oral BID WC  . potassium chloride  20 mEq Oral BID  . simvastatin  40 mg Oral q1800  . sodium chloride  3 mL Intravenous Q12H  . vitamin B-12  50 mcg Oral Daily  . DISCONTD: insulin glargine  20 Units Subcutaneous QHS  . DISCONTD: pravastatin  40 mg Oral  Daily   Infusions:     Objective:  Blood pressure 156/76, pulse 77, temperature 98.2 F (36.8 C), temperature source Oral, resp. rate 18, height 6' (1.829 m), weight 207 lb 14.4 oz (94.303 kg), SpO2 97.00%. Weight change: -1 lb 9.6 oz (-0.726 kg)  Physical Exam: General:  Well appearing. No resp difficulty HEENT: normal Neck: supple. JVP 5 . Carotids 2+ bilat; no bruits. No lymphadenopathy or thryomegaly appreciated. Cor: PMI nondisplaced. Regular rate & rhythm. No rubs, gallops or murmurs. Lungs: clear Abdomen: soft, nontender, nondistended. No hepatosplenomegaly. No bruits or masses. Good bowel sounds. Extremities: no cyanosis, clubbing, rash, edema. LLE wrapped due to ulcers Neuro: alert & orientedx3, cranial nerves grossly intact. moves all 4 extremities w/o difficulty. Affect pleasant  Telemetry: SR  Lab Results: Basic Metabolic Panel:  Lab 09/24/11 5956 09/23/11 0430 09/22/11 0455 09/21/11 1805  NA 137 140 140 142  K 4.0 3.2* -- --  CL 101 104 103 104  CO2 31 30 29 28   GLUCOSE 202* 94 100* 149*  BUN 18 16 16 14   CREATININE 1.53* 1.49* 1.43* 1.26  CALCIUM 9.1 9.2 8.9 9.7  MG -- -- -- --  PHOS -- -- -- --   Liver Function Tests:  Lab  09/21/11 1805  AST 14  ALT 12  ALKPHOS 108  BILITOT 0.4  PROT 6.9  ALBUMIN 3.1*   No results found for this basename: LIPASE:5,AMYLASE:5 in the last 168 hours No results found for this basename: AMMONIA:5 in the last 168 hours CBC:  Lab 09/21/11 1805  WBC 6.1  NEUTROABS 3.4  HGB 13.9  HCT 41.0  MCV 80.6  PLT 207   Cardiac Enzymes:  Lab 09/22/11 0455 09/21/11 2350 09/21/11 1803  CKTOTAL -- -- --  CKMB -- -- --  CKMBINDEX -- -- --  TROPONINI <0.30 <0.30 <0.30   BNP: No components found with this basename: POCBNP:5 CBG:  Lab 09/24/11 0758 09/23/11 2203 09/23/11 2159 09/23/11 1604 09/23/11 1114  GLUCAP 158* 297* 300* 182* 244*   Microbiology: No results found for this basename: cult   No results found for this  basename: CULT:2,SDES:2 in the last 168 hours  Imaging: No results found.   ASSESSMENT:  1. Acute on Chronic Combined Systolic and Diastolic CHF in setting of Hypertensive Urgency  2. Coronary Artery Disease 3. Hypertension 4. Hyperlipidemia 5. Chronic Kidney Disease 6. DM2 7. Inability to afford medicines   PLAN/DISCUSSION:  Volume status looks ok but patient still reports dyspnea and is wearing oxygen.  I will continue po Lasix given rise in creatinine.  I will get a BNP.  I am also going to walk him off oxygen to see if his level drops.   Echocardiogram given decreased EF by LV-gram in 8/13 for closer evaluation of current LV systolic function.   BP still high, will add lisinopril 5 mg daily watching creatinine carefully.   Lantus stopped due to recurrent hypoglycemia but blood glucose high overnight.  Will add pm dose of glipizide.   Wound care following.  Will need would care and HHRN at discharge.   Hope for discharge tomorrow.    LOS: 3 days    Marca Ancona, MD 09/24/2011, 8:20 AM

## 2011-09-24 NOTE — Progress Notes (Signed)
Inpatient Diabetes Program Recommendations  AACE/ADA: New Consensus Statement on Inpatient Glycemic Control (2013)  Target Ranges:  Prepandial:   less than 140 mg/dL      Peak postprandial:   less than 180 mg/dL (1-2 hours)      Critically ill patients:  140 - 180 mg/dL   Reason for Visit: Sub-optimal glycemic control  Inpatient Diabetes Program Recommendations HgbA1C: No recent Hgb A1C.  Request MD order. Diet: Currently on Heart Healthy diet.  Request MD consider adding CHO Moderate restrictions to present order.

## 2011-09-25 ENCOUNTER — Encounter (HOSPITAL_COMMUNITY): Payer: Self-pay | Admitting: Cardiology

## 2011-09-25 DIAGNOSIS — I5033 Acute on chronic diastolic (congestive) heart failure: Principal | ICD-10-CM

## 2011-09-25 LAB — GLUCOSE, CAPILLARY
Glucose-Capillary: 193 mg/dL — ABNORMAL HIGH (ref 70–99)
Glucose-Capillary: 159 mg/dL — ABNORMAL HIGH (ref 70–99)
Glucose-Capillary: 185 mg/dL — ABNORMAL HIGH (ref 70–99)

## 2011-09-25 LAB — BASIC METABOLIC PANEL
CO2: 31 mEq/L (ref 19–32)
Calcium: 9.1 mg/dL (ref 8.4–10.5)
Chloride: 101 mEq/L (ref 96–112)
Creatinine, Ser: 1.46 mg/dL — ABNORMAL HIGH (ref 0.50–1.35)
Glucose, Bld: 167 mg/dL — ABNORMAL HIGH (ref 70–99)

## 2011-09-25 MED ORDER — FUROSEMIDE 40 MG PO TABS: 40.0000 mg | ORAL_TABLET | Freq: Two times a day (BID) | ORAL | Status: DC

## 2011-09-25 MED ORDER — CARVEDILOL 25 MG PO TABS: 25.0000 mg | ORAL_TABLET | Freq: Two times a day (BID) | ORAL | Status: DC

## 2011-09-25 MED ORDER — GLIPIZIDE 5 MG PO TABS: 5.0000 mg | ORAL_TABLET | Freq: Two times a day (BID) | ORAL | Status: DC

## 2011-09-25 MED ORDER — POTASSIUM CHLORIDE CRYS ER 20 MEQ PO TBCR: 20.0000 meq | EXTENDED_RELEASE_TABLET | Freq: Two times a day (BID) | ORAL | Status: DC

## 2011-09-25 MED ORDER — NITROGLYCERIN 0.4 MG SL SUBL: 0.4000 mg | SUBLINGUAL_TABLET | SUBLINGUAL | Status: DC | PRN

## 2011-09-25 MED ORDER — METFORMIN HCL 500 MG PO TABS
1000.0000 mg | ORAL_TABLET | Freq: Two times a day (BID) | ORAL | Status: DC
  Filled 2011-09-25 (×2): qty 2

## 2011-09-25 MED ORDER — LISINOPRIL 5 MG PO TABS: 5.0000 mg | ORAL_TABLET | Freq: Every day | ORAL | Status: DC

## 2011-09-25 MED ORDER — COLLAGENASE 250 UNIT/GM EX OINT: TOPICAL_OINTMENT | Freq: Every day | CUTANEOUS | Status: DC

## 2011-09-25 MED ORDER — LISINOPRIL 5 MG PO TABS
5.0000 mg | ORAL_TABLET | Freq: Every day | ORAL | Status: DC
  Administered 2011-09-25: 5 mg via ORAL
  Filled 2011-09-25: qty 1

## 2011-09-25 MED ORDER — METFORMIN HCL 1000 MG PO TABS: 1000.0000 mg | ORAL_TABLET | Freq: Two times a day (BID) | ORAL | Status: DC

## 2011-09-25 NOTE — Discharge Summary (Signed)
Discharge Summary   Patient ID: Joshua Vincent MRN: [id number], DOB/AGE: 1934/05/04 77 y.o.  Primary MD: Benita Stabile, MD Primary Cardiologist: Dr. Shirlee Latch Admit date: 09/21/2011 D/C date:     09/25/2011      Primary Discharge Diagnoses:  1. Acute on Chronic Diastolic CHF   - In the setting of Hypertensive Urgency and medication noncompliance  - Echo showed improved EF 55-60% (previously 40%), grade 1 diastolic dysfunction  - Improved with diuresis, wt 209 --> 208lbs  - ACEI added  - F/u BNP  2. Hypertensive Urgency  - Due to medication noncompliance  - Initiated on Lisinopril 5mg    - Keep BP log and f/u w/ Dr. Shirlee Latch  3. Renal Insufficiency  - In the setting of diuresis  - Crt 1.46 at dc; F/u BMET  4. Diabetes Mellitus, Type 2  - Lantus was dc'd due to recurrent hypoglycemia and cost  - PM dose of glipizide added and Metformin increased to 1000mg  bid  - Keep blood glucose log and F/u w/ PCP  5. Medication Noncompliance  - Due to inability to afford medicines  - Effort made to prescribe affordable medications and provide resources for community assistance  6. Chronic LLE Wound  - Home wound care & f/u w/ PCP   Secondary Discharge Diagnoses:  . HLD (hyperlipidemia)   . Benign prostatic hypertrophy     hx of  . Osteoarthrosis, unspecified whether generalized or localized, shoulder region   . History of tobacco abuse     quit 1992  . CAD (coronary artery disease)     4v CABG (1/11): LIMA LAD, SVG-D, SVG-OM, SVG-PLV. ; NSTEMI 08/2011 cath  revealed severe native CAD, patent SVG to OM & LIMA to LAD, occluded SVG to RCA and SVG to Diagonal, EF 40%, no culprit lesion for PCI, med therapy  . CVA (cerebral infarction)     peri-catheterization in 12/10.  Patient initially developed double vision and MRI showed right dorsal mid-brain infarct.  Carotid dopplers with no significant disease.  Double vision has resolved.    . Bladder cancer     s/p resection 7/11  . PVC's  (premature ventricular contractions)     3 week  event monitor 8/11: no signifiant arrhythmias, occ PVCs  . Iron deficiency anemia   . Obesity    Allergies Allergies  Allergen Reactions  . Niacin Itching    Diagnostic Studies/Procedures:  09/22/11 - Echo Study Conclusions: - Left ventricle: The cavity size was normal. Wall thickness was increased in a pattern of mild LVH. Systolic function was normal. The estimated ejection fraction was in the range of 55% to 60%. Doppler parameters are consistent with abnormal left ventricular relaxation (grade 1 diastolic dysfunction). - Mitral valve: Mild regurgitation. - Left atrium: The atrium was mildly dilated.  History of Present Illness: 77 y.o. male w/ the above medical problems who was admitted from the office to [facility] on 09/21/11 with dyspnea and markedly elevated BP. Since discharge from the hospital in 08/2011 he ran out of his medications and noted worsening dyspnea on exertion and orthopnea. At his office visit he was not markedly volume overloaded, but his BP was 198/108 and it was felt he would likely worsen quickly and was therefore admitted for IV diuresis and BP control.  Hospital Course: At [facility] EKG revealed NSR RBBB, no acute ST/T changes. CXR was without acute cardiopulmonary abnormalities. Labs were significant for normal troponin, pBNP 5852, Crt 1.26, K+ 3.2, unremarkable CBC. He  was placed on IV NTG for BP control and Coreg, Amlodipine, and Cardura were resumed. He had good diuresis with IV lasix and BP decreased with resumption of antihypertensives. Crt increased slightly to 1.53. Lasix was transitioned to oral dosing. Crt improving, 1.46 at discharge. He was initiated on Lisinopril 5mg  daily for better BP control with plans for close monitoring of renal function.  He was instructed to keep a log of BP and bring to follow up appointment. Echo 09/22/11 showed improved EF 55-60% and grade 1 diastolic dysfunction. Cardiac  enzymes were cycled and remained negative.   Blood sugars were initially low for which Lantus was stopped. His blood sugars became elevated and glipizide was increased to bid and Metformin to 1000mg  bid. He was instructed to keep a log of his blood sugars and follow up with his PCP. Wound care nurse evaluated his LLE wounds, performed wound care, and recommended outpatient wound care center follow up which was arranged by case management. He will also resume home health nursing at discharge.  He was able to ambulate off oxygen with improved dyspnea and oxygenation. He was seen and evaluated by Dr. Shirlee Latch who felt he was stable for discharge home with plans for follow up as scheduled below.   Discharge Vitals: Blood pressure 149/68, pulse 67, temperature 98.1 F (36.7 C), temperature source Oral, resp. rate 19, height 6' (1.829 m), weight 208 lb 1.6 oz (94.394 kg), SpO2 98.00%.  Labs: Component Value Date   WBC 6.1 09/21/2011   HGB 13.9 09/21/2011   HCT 41.0 09/21/2011   MCV 80.6 09/21/2011   PLT 207 09/21/2011    Lab 09/25/11 0630 09/21/11 1805  NA 138 --  K 3.8 --  CL 101 --  CO2 31 --  BUN 17 --  CREATININE 1.46* --  CALCIUM 9.1 --  PROT -- 6.9  BILITOT -- 0.4  ALKPHOS -- 108  ALT -- 12  AST -- 14  GLUCOSE 167* --     09/21/2011 18:03  Pro B Natriuretic peptide (BNP) 5852.0 (H)     09/21/2011 18:03 09/21/2011 23:50 09/22/2011 04:55  Troponin I <0.30 <0.30 <0.30    Discharge Medications     Medication List     As of 09/25/2011 10:33 AM    STOP taking these medications         insulin glargine 100 UNIT/ML injection   Commonly known as: LANTUS      TAKE these medications         amLODipine 10 MG tablet   Commonly known as: NORVASC   Take 10 mg by mouth daily.      aspirin EC 81 MG tablet   Take 81 mg by mouth daily.      atorvastatin 80 MG tablet   Commonly known as: LIPITOR   Take 1 tablet (80 mg total) by mouth at bedtime.      carvedilol 25 MG tablet    Commonly known as: COREG   Take 1 tablet (25 mg total) by mouth 2 (two) times daily with a meal.      collagenase ointment   Commonly known as: SANTYL   Apply topically daily.      doxazosin 8 MG tablet   Commonly known as: CARDURA   Take 1 tablet (8 mg total) by mouth at bedtime.      furosemide 40 MG tablet   Commonly known as: LASIX   Take 1 tablet (40 mg total) by mouth 2 (two) times daily.  glipiZIDE 5 MG tablet   Commonly known as: GLUCOTROL   Take 1 tablet (5 mg total) by mouth 2 (two) times daily before a meal.      lisinopril 5 MG tablet   Commonly known as: PRINIVIL,ZESTRIL   Take 1 tablet (5 mg total) by mouth daily.      metFORMIN 1000 MG tablet   Commonly known as: GLUCOPHAGE   Take 1 tablet (1,000 mg total) by mouth 2 (two) times daily with a meal.      nitroGLYCERIN 0.4 MG SL tablet   Commonly known as: NITROSTAT   Place 1 tablet (0.4 mg total) under the tongue every 5 (five) minutes as needed. For chest pain      omega-3 acid ethyl esters 1 G capsule   Commonly known as: LOVAZA   Take 1 g by mouth daily.      potassium chloride SA 20 MEQ tablet   Commonly known as: K-DUR,KLOR-CON   Take 1 tablet (20 mEq total) by mouth 2 (two) times daily.      traMADol 50 MG tablet   Commonly known as: ULTRAM   Take 50 mg by mouth every 6 (six) hours as needed. For pain      vitamin B-12 50 MCG tablet   Commonly known as: CYANOCOBALAMIN   Take 50 mcg by mouth daily.         Disposition   Discharge Orders    Future Appointments: Provider: Department: Dept Phone: Center:   10/01/2011 10:30 AM [facility] [phone] [facility]   10/02/2011 9:45 AM Laurey Morale, MD [facility] [phone] [facility]     Future Orders Please Complete By Expires   Diet - low sodium heart healthy      Increase activity slowly      Discharge instructions      Comments:   * Please keep a log of your blood sugars and bring to your  follow up office visit with your primary care provider (please see within 1-2 weeks)  * Please keep a log of your blood pressures and bring to your follow up office visit with Dr. Shirlee Latch.  * Please take all medications as prescribed.     Follow-up Information    Follow up with [facility]. [facility] RN, aide)    Contact information:   [phone]      Follow up with Marca Ancona, MD. On 10/02/2011. (9:45 am)    Contact information:   [facility] [address] [place] [address] [phone]       Schedule an appointment as soon as possible for a visit with Benita Stabile, MD. (See next week for diabetes management.)    Contact information:   [facility] [address] [place] [address] [phone]       Follow up with [facility]. On 10/01/2011. (Lab work any time between 8:30 & 4:30)    Solicitor information:   [address] [place] [address]           Outstanding Labs/Studies:  1. BMET/BNP  Duration of Discharge Encounter: Greater than 30 minutes including physician and PA time.  Signed, Aayat Hajjar PA-C 09/25/2011, 10:33 AM

## 2011-09-25 NOTE — Progress Notes (Addendum)
09/25/11 1141 Tera Mater, RN, BSN NCM [phone] Pt. is currently being followed by [facility] for [facility] RN, HH PT, and CSW.  TC to [facility] with [facility] to add [facility] aide.  Pt. to be discharged home today.    09/25/11 1146 Tera Mater, RN, BSN NCM [phone] TC to Hilda Lias, with [facility] to make aware of [facility] RN to draw BNP, and BMET on 10/01/11 and fax to Dr. Ronelle Nigh at [phone].

## 2011-09-25 NOTE — Progress Notes (Signed)
Pt D/c to home with family. D/c instructions and Medications reviewed with Pt. Pt states understanding. All Pt questions answered.

## 2011-09-25 NOTE — Progress Notes (Signed)
Patient ID: Joshua Vincent, male   DOB: 13-Jul-1934, 77 y.o.   MRN: [id number]     Subjective:  Joshua Vincent is a 77 yo male with h/o CAD s/p NSTEMI in 12/10 accompanied by pulmonary edema and peri-catheterization CVA, now s/p CABG. He was admitted 8/11-8/15 with a non-STEMI complicated by acute on chronic combined systolic and diastolic CHF and lower extremity cellulitis. LHC 08/20/11: LAD proximal 100%, circumflex proximal sub 100%, mid RCA 40%, mid PDA 80-90%, proximal PLSA 90%, proximal SVG-RCA 100%, proximal SVG-diagonal 100%, SVG-OM patent, LIMA-LAD patent, EF 40%. No lesions were amenable to PCI. Medical therapy was recommended. ACE inhibitor was avoided given renal insufficiency. He had some difficulty with hypoxia. This improved with diuresis. He was treated with antibiotics for leg cellulitis.   Readmitted from the office on 9/13 with a/c HF in setting of HTN crisis after running out of his meds. (BP 220/100)  He was initially diuresed with IV Lasix, now on po.  Creatinine has risen to 1.5.  Still short of breath with exertion but doing better.  Good oxygen level on room air when ambulating.  BP better but still high.     Intake/Output Summary (Last 24 hours) at 09/25/11 0820 Last data filed at 09/25/11 0800  Gross per 24 hour  Intake   1566 ml  Output   2100 ml  Net   -534 ml    Current meds:    . amLODipine  10 mg Oral Daily  . aspirin EC  81 mg Oral Daily  . atorvastatin  80 mg Oral QHS  . carvedilol  25 mg Oral BID WC  . collagenase   Topical Daily  . doxazosin  8 mg Oral QHS  . enoxaparin  40 mg Subcutaneous Q24H  . furosemide  40 mg Oral BID  . glipiZIDE  5 mg Oral BID AC  . insulin aspart  0-15 Units Subcutaneous TID WC  . lisinopril  5 mg Oral Daily  . metFORMIN  500 mg Oral BID WC  . potassium chloride  20 mEq Oral BID  . sodium chloride  3 mL Intravenous Q12H  . vitamin B-12  50 mcg Oral Daily  . DISCONTD: glipiZIDE  5 mg Oral QAC breakfast  . DISCONTD:  simvastatin  40 mg Oral q1800   Infusions:     Objective:  Blood pressure 165/83, pulse 72, temperature 98.1 F (36.7 C), temperature source Oral, resp. rate 20, height 6' (1.829 m), weight 208 lb 1.6 oz (94.394 kg), SpO2 96.00%. Weight change: 3.2 oz (0.091 kg)  Physical Exam: General:  Well appearing. No resp difficulty HEENT: normal Neck: supple. JVP 5 . Carotids 2+ bilat; no bruits. No lymphadenopathy or thryomegaly appreciated. Cor: PMI nondisplaced. Regular rate & rhythm. No rubs, gallops or murmurs. Lungs: clear Abdomen: soft, nontender, nondistended. No hepatosplenomegaly. No bruits or masses. Good bowel sounds. Extremities: no cyanosis, clubbing, rash, edema. LLE wrapped due to ulcers Neuro: alert & orientedx3, cranial nerves grossly intact. moves all 4 extremities w/o difficulty. Affect pleasant  Telemetry: SR  Lab Results: Basic Metabolic Panel:  Lab 09/25/11 4098 09/24/11 0425 09/23/11 0430 09/22/11 0455 09/21/11 1805  NA 138 137 140 140 142  K 3.8 4.0 -- -- --  CL 101 101 104 103 104  CO2 31 31 30 29 28   GLUCOSE 167* 202* 94 100* 149*  BUN 17 18 16 16 14   CREATININE 1.46* 1.53* 1.49* 1.43* 1.26  CALCIUM 9.1 9.1 9.2 8.9 9.7  MG -- -- -- -- --  PHOS -- -- -- -- --   Liver Function Tests:  Lab 09/21/11 1805  AST 14  ALT 12  ALKPHOS 108  BILITOT 0.4  PROT 6.9  ALBUMIN 3.1*   No results found for this basename: LIPASE:5,AMYLASE:5 in the last 168 hours No results found for this basename: AMMONIA:5 in the last 168 hours CBC:  Lab 09/21/11 1805  WBC 6.1  NEUTROABS 3.4  HGB 13.9  HCT 41.0  MCV 80.6  PLT 207   Cardiac Enzymes:  Lab 09/22/11 0455 09/21/11 2350 09/21/11 1803  CKTOTAL -- -- --  CKMB -- -- --  CKMBINDEX -- -- --  TROPONINI <0.30 <0.30 <0.30   BNP: No components found with this basename: POCBNP:5 CBG:  Lab 09/25/11 0600 09/24/11 2116 09/24/11 1623 09/24/11 1158 09/24/11 0758  GLUCAP 159* 193* 165* 217* 158*   Microbiology: No  results found for this basename: cult   No results found for this basename: CULT:2,SDES:2 in the last 168 hours  Imaging: No results found.   ASSESSMENT:  1. Acute on Chronic Diastolic CHF in setting of Hypertensive Urgency.  EF 55-60% on echo this admission which is improved from prior.  2. Coronary Artery Disease 3. Hypertension 4. Hyperlipidemia 5. Chronic Kidney Disease 6. DM2 7. Inability to afford medicines   PLAN/DISCUSSION:  Patient still has some exertional dyspnea but volume status looks better with flat JVP and no edema. Echo actually showed improved LV systolic function, EF 55-60%.   BP still high, will add lisinopril 5 mg daily watching creatinine carefully.   Lantus stopped due to recurrent hypoglycemia but blood glucose high overnight.  Added pm dose of glipizide and will increase metformin to 1000 mg bid for home.  Will try him off Lantus at home which will help with costs.   Wound care following.  Will need wound care and HHRN at discharge.   Disposition: Discharge today.  He needs an appointment to see me in 5-7 days (may overbook).  He needs an appointment to see his PCP (Dr. Clovis Riley) to manage diabetes in a week or so.  He should check blood glucose and record in a log for PCP.  Keep log of BP for me.  Needs BMET/BNP in 5 days.   Needs home health RN and wound care at home.  Home cardiac and diabetes meds: Lasix 40 mg po bid, KCl 20 bid, amlodipine 10 daily, ASA 81 daily, atorvastatin 80 mg daily, Coreg 25 mg bid, doxazosin 8 daily, lisinopril 5 mg daily, glipizide 5 mg bid, metformin 1000 mg bid.  Will have him stop Lantus for now (expensive and increased glipizide and metformin).    LOS: 4 days    Marca Ancona, MD 09/25/2011, 8:20 AM

## 2011-09-28 ENCOUNTER — Other Ambulatory Visit: Payer: Self-pay | Admitting: *Deleted

## 2011-09-28 DIAGNOSIS — I6350 Cerebral infarction due to unspecified occlusion or stenosis of unspecified cerebral artery: Secondary | ICD-10-CM

## 2011-09-28 DIAGNOSIS — N189 Chronic kidney disease, unspecified: Secondary | ICD-10-CM

## 2011-09-28 DIAGNOSIS — IMO0001 Reserved for inherently not codable concepts without codable children: Secondary | ICD-10-CM

## 2011-09-28 DIAGNOSIS — I5032 Chronic diastolic (congestive) heart failure: Secondary | ICD-10-CM

## 2011-10-01 ENCOUNTER — Other Ambulatory Visit: Payer: Self-pay

## 2011-10-01 ENCOUNTER — Ambulatory Visit (INDEPENDENT_AMBULATORY_CARE_PROVIDER_SITE_OTHER): Payer: Medicare Other | Admitting: Cardiology

## 2011-10-01 ENCOUNTER — Encounter: Payer: Self-pay | Admitting: Cardiology

## 2011-10-01 ENCOUNTER — Other Ambulatory Visit (INDEPENDENT_AMBULATORY_CARE_PROVIDER_SITE_OTHER): Payer: Medicare Other

## 2011-10-01 VITALS — BP 126/77 | HR 75 | Ht 72.0 in | Wt 203.0 lb

## 2011-10-01 DIAGNOSIS — I6350 Cerebral infarction due to unspecified occlusion or stenosis of unspecified cerebral artery: Secondary | ICD-10-CM

## 2011-10-01 DIAGNOSIS — N189 Chronic kidney disease, unspecified: Secondary | ICD-10-CM

## 2011-10-01 DIAGNOSIS — I509 Heart failure, unspecified: Secondary | ICD-10-CM

## 2011-10-01 DIAGNOSIS — E782 Mixed hyperlipidemia: Secondary | ICD-10-CM

## 2011-10-01 DIAGNOSIS — R0602 Shortness of breath: Secondary | ICD-10-CM

## 2011-10-01 DIAGNOSIS — E785 Hyperlipidemia, unspecified: Secondary | ICD-10-CM

## 2011-10-01 DIAGNOSIS — IMO0001 Reserved for inherently not codable concepts without codable children: Secondary | ICD-10-CM

## 2011-10-01 DIAGNOSIS — I5032 Chronic diastolic (congestive) heart failure: Secondary | ICD-10-CM

## 2011-10-01 DIAGNOSIS — I2510 Atherosclerotic heart disease of native coronary artery without angina pectoris: Secondary | ICD-10-CM

## 2011-10-01 DIAGNOSIS — I10 Essential (primary) hypertension: Secondary | ICD-10-CM

## 2011-10-01 NOTE — Progress Notes (Signed)
Patient ID: Joshua Vincent, male   DOB: 1934/06/19, 77 y.o.   MRN: [id number] PCP: Dr. Clovis Riley  77 yo with history of diastolic CHF, CAD s/p NSTEMI in 12/10 accompanied by pulmonary edema and peri-catheterization CVA, now s/p CABG presents for followup.  Since I last saw him in the office, he was admitted in 8/13 with NSTEMI.  LHC showed severe disease in the RCA and occlusion of the LAD and LCX.  SVG-D and SVG-RCA were occluded.  SVG-OM and LIMA-LAD were patent.  EF was 45% by LV-gram but echo done in 9/13 showed EF 55-60% . He was managed medically.  He also has had cellulitis related to a left lower leg injury from work.   He was recently readmitted in 9/13 with hypertensive emergency and acute on chronic diastolic CHF.  He had not been taking his medications due to financial strains.  He was admitted and diuresed and discharged recently on an adjusted regimen to try to control medication costs.  He now has home health coming in twice a week and wound care followup for his leg .   Since discharge, he reports that his dyspnea is considerably better.  It is still, however, quite significant.  He is short of breath after walking about 100 feet.  He is ok walking around his house.  No orthopnea (this is improved).  He has been having daily chest pressure since prior to last hospitalization.  There is no trigger, it is mild, and it only lasts for a few minutes.  He has no exertional chest symptoms.  BP is now much better controlled.   ECG: NSR, LAFB, RBBB  Labs (5/11): BNP 520, K 4.1, creatinine 1.0 Labs (8/11): BNP 120, HDL 34, LDL 148 Labs (9/11): K 4.7, creatinine 1.2 Labs (12/11): K 3.3 => 5.3, creatinine 0.9 => 1.3, BNP 918 => 199 Labs (2/12): K 3.9, creatinine 1.11 Labs (6/12): K 4.3, creatinine 1.0 Labs (8/13): LDL 142, HDL 40 (not taking statin) Labs (9/13): K 3.8, creatinine 1.46  Allergies (verified):  1)  ! Niacin  Past Medical History: 1. HYPERTENSION (ICD-401.9) 2. DYSLIPIDEMIA  (ICD-272.4) 3. BENIGN PROSTATIC HYPERTROPHY, HX OF (ICD-V13.8) 4. DIABETIC PERIPHERAL NEUROPATHY (ICD-250.60) 5. DM (ICD-250.00) 6. OSTEOARTHRITIS, SHOULDER, RIGHT (ICD-715.91) 7. TOBACCO USE, QUIT (ICD-V15.82) 8. CAD: NSTEMI (12/10).  LHC showed sequential 90% mid LAD stenoses and 90% stenosis at the mid to distal LAD, D1 totally occluded, OM1 subtotally occluded, 70% mid CFX, EF 50%.  CABG (1/11): LIMA-LAD, SVG-D, SVG-OM, SVG-PLV.  NSTEMI 8/13 with LHC showing total occlusion of the LAD and LCX, 80-90% mPDA, 90% PLV, occluded SVG-RCA, occluded SVG-D, patent SVG-OM and patent LIMA-LAD.  He was managed medically.  9.  CVA: peri-catheterization in 12/10.  Patient initially developed double vision and MRI showed right dorsal mid-brain infarct.  Carotid dopplers with no significant disease.  Double vision has resolved.  10.  Diastolic CHF: Pulmonary edema with NSTEMI.  Echo (12/10) showed EF 50-55% with mid to apical septal HK and mild mitral regurgitation.  Echo (3/11) when admitted with CHF exacerbation: Moderate LVH, EF 60%, valves ok.  LV-gram with EF 45% in 8/13.  Echo (9/13) with EF 55-60%, mild MR.  11. Bladder cancer s/p resection 7/11 12. 3 week event monitor (8/11): no significant arrhythmias, occasional PVCs 13. CKD   Family History: Father died at age 91 from an embolic cerebrovascular accident from a deep vein thrombosis in his lower extremity.  His  brother died in his 22s from similar circumstances  of an embolic CVA from a lower extremity deep vein thrombosis.  Mother died at age 34 without health problems prior to that.  The patient has a sister who died in her 68s from sequelae of a war injury.  He has a sister who is alive and is 31 years old with osteoarthritis.  He has 3 sons who are alive and healthy.   Social History: The patient is married.  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  He has had a distant smoking history, 50-pack years and quit  20 years ago.  He drinks alcohol occasionally.  He does not use drugs.    Current Outpatient Prescriptions  Medication Sig Dispense Refill  . amLODipine (NORVASC) 10 MG tablet Take 10 mg by mouth daily.      Marland Kitchen aspirin EC 81 MG tablet Take 81 mg by mouth daily.      Marland Kitchen atorvastatin (LIPITOR) 80 MG tablet Take 1 tablet (80 mg total) by mouth at bedtime.  30 tablet  6  . carvedilol (COREG) 25 MG tablet Take 1 tablet (25 mg total) by mouth 2 (two) times daily with a meal.  60 tablet  11  . collagenase (SANTYL) ointment Apply topically daily.  15 g  1  . doxazosin (CARDURA) 8 MG tablet Take 1 tablet (8 mg total) by mouth at bedtime.  90 tablet  3  . furosemide (LASIX) 40 MG tablet Take 1 tablet (40 mg total) by mouth 2 (two) times daily.  60 tablet  6  . glipiZIDE (GLUCOTROL) 5 MG tablet Take 1 tablet (5 mg total) by mouth 2 (two) times daily before a meal.  60 tablet  11  . lisinopril (PRINIVIL,ZESTRIL) 5 MG tablet Take 1 tablet (5 mg total) by mouth daily.  30 tablet  11  . metFORMIN (GLUCOPHAGE) 1000 MG tablet Take 1 tablet (1,000 mg total) by mouth 2 (two) times daily with a meal.  60 tablet  11  . nitroGLYCERIN (NITROSTAT) 0.4 MG SL tablet Place 1 tablet (0.4 mg total) under the tongue every 5 (five) minutes as needed. For chest pain  25 tablet  3  . omega-3 acid ethyl esters (LOVAZA) 1 G capsule Take 1 g by mouth daily.      . potassium chloride SA (K-DUR,KLOR-CON) 20 MEQ tablet Take 1 tablet (20 mEq total) by mouth 2 (two) times daily.  60 tablet  6  . traMADol (ULTRAM) 50 MG tablet Take 50 mg by mouth every 6 (six) hours as needed. For pain      . vitamin B-12 (CYANOCOBALAMIN) 50 MCG tablet Take 50 mcg by mouth daily.        BP 126/77  Pulse 75  Ht 6' (1.829 m)  Wt 203 lb (92.08 kg)  BMI 27.53 kg/m2 General:  Well developed, well nourished, in no acute distress. Neck:  Neck supple, JVP 7 cm. No masses, thyromegaly or abnormal cervical nodes. Lungs:  Clear bilaterally to auscultation  and percussion. Heart:  Non-displaced PMI, chest non-tender; regular rate and rhythm, S1, S2 without murmurs, rubs or gallops. Carotid upstroke normal, no bruit.  Trace ankle edema on right, left leg is wrapped. Abdomen:  Bowel sounds positive; abdomen soft and non-tender without masses, organomegaly, or hernias noted. No hepatosplenomegaly. Extremities:  No clubbing or cyanosis. Neurologic:  Alert and oriented x 3. Psych:  Normal affect.  Assessment/Plan: 1. CAD: Status post CABG.  Cath 8/13 showed 2/4 patent grafts.  He was managed medically. He has atypical chest pressure but no exertional chest pain.  Continue ASA 81, atorvastatin, Coreg, ACEI.  I am going to set him up for cardiac rehab.  2. Chronic diastolic CHF: EF normal on most recent echo.  He continues to have NYHA class III symptoms but improved with no orthopnea.  Volume looks stable.  He does not appear significantly overloaded.  He has been taking his Lasix 40 mg bid and will continue this dosing.  BMET/BNP were drawn today, pending.  Continue BP control.  3. CKD: BMET today.  4. HTN: BP under much better control.  Continue current meds.  5. Hyperlipidemia: Lipids/LFTs in 2 months.  Goal LDL < 70.  6. Followup in 2 wks.   Marca Ancona 10/01/2011 5:49 PM

## 2011-10-01 NOTE — Patient Instructions (Signed)
Your physician recommends that you continue on your current medications as directed. Please refer to the Current Medication list given to you today. You need to take all of your medications.  Please review the medication list given to you today and verify you are taking all these medications and have current prescriptions for them. If you are missing a prescription, please call our office at [phone] and let us know.   We will enroll you in Cardiac Rehab.  Your physician recommends that you schedule a follow-up appointment in: 2 weeks with Dr. Shirlee Latch.  Your physician recommends that you return for FASTING lab work in: 2 months- lipid/liver  .

## 2011-10-02 ENCOUNTER — Other Ambulatory Visit: Payer: Self-pay

## 2011-10-02 ENCOUNTER — Encounter: Payer: Self-pay | Admitting: Cardiology

## 2011-10-02 LAB — BASIC METABOLIC PANEL
BUN: 15 mg/dL (ref 6–23)
CO2: 24 mEq/L (ref 19–32)
Calcium: 9.2 mg/dL (ref 8.4–10.5)
Chloride: 101 mEq/L (ref 96–112)
Creatinine, Ser: 1.6 mg/dL — ABNORMAL HIGH (ref 0.4–1.5)
GFR: 45.68 mL/min — ABNORMAL LOW (ref >60.00)
Glucose, Bld: 244 mg/dL — ABNORMAL HIGH (ref 70–99)
Potassium: 4.1 mEq/L (ref 3.5–5.1)
Sodium: 141 mEq/L (ref 135–145)

## 2011-10-02 LAB — BRAIN NATRIURETIC PEPTIDE: Pro B Natriuretic peptide (BNP): 385 pg/mL — ABNORMAL HIGH (ref 0.0–100.0)

## 2011-10-12 ENCOUNTER — Telehealth: Payer: Self-pay | Admitting: Cardiology

## 2011-10-12 NOTE — Telephone Encounter (Signed)
Per mellissa at [facility]--dr mitchell (PCP)--dr mitchell cannot order o2 , but feels pt should be on --medicare requires specific parameters for o2--mellissa will bring instructions for what they need and drop of today--pt has an appoint Monday and they wondered if dr Shirlee Latch can order for this pt--mellissa will drop off all instructions --advised i would pass information along to anne lankford for the appoint on monday

## 2011-10-12 NOTE — Telephone Encounter (Signed)
ok 

## 2011-10-12 NOTE — Telephone Encounter (Signed)
Dr mclean--pt has an appoint with you on Monday , next week--by then we should have parameter testing instructions and we could do this on Monday at his appoint with you--nt

## 2011-10-12 NOTE — Telephone Encounter (Signed)
I can order it if he qualifies but we will have to check oxygen saturation at rest and with exertion. Would have him come in for this in the next week.

## 2011-10-12 NOTE — Telephone Encounter (Signed)
PCP Dr. Clovis Riley can't order O2 and they want Dr. Shirlee Latch to order it on his next visit please call so she can give you details as to what she needs for the patient

## 2011-10-15 ENCOUNTER — Telehealth: Payer: Self-pay | Admitting: Cardiology

## 2011-10-15 ENCOUNTER — Ambulatory Visit (INDEPENDENT_AMBULATORY_CARE_PROVIDER_SITE_OTHER): Payer: Medicare Other | Admitting: Cardiology

## 2011-10-15 ENCOUNTER — Encounter: Payer: Self-pay | Admitting: Cardiology

## 2011-10-15 ENCOUNTER — Other Ambulatory Visit: Payer: Self-pay | Admitting: *Deleted

## 2011-10-15 VITALS — BP 138/68 | HR 74 | Ht 71.0 in | Wt 199.0 lb

## 2011-10-15 DIAGNOSIS — I5022 Chronic systolic (congestive) heart failure: Secondary | ICD-10-CM

## 2011-10-15 DIAGNOSIS — E785 Hyperlipidemia, unspecified: Secondary | ICD-10-CM

## 2011-10-15 DIAGNOSIS — I5032 Chronic diastolic (congestive) heart failure: Secondary | ICD-10-CM

## 2011-10-15 DIAGNOSIS — Z87891 Personal history of nicotine dependence: Secondary | ICD-10-CM

## 2011-10-15 DIAGNOSIS — I2510 Atherosclerotic heart disease of native coronary artery without angina pectoris: Secondary | ICD-10-CM

## 2011-10-15 DIAGNOSIS — R0602 Shortness of breath: Secondary | ICD-10-CM

## 2011-10-15 DIAGNOSIS — I10 Essential (primary) hypertension: Secondary | ICD-10-CM

## 2011-10-15 LAB — BASIC METABOLIC PANEL
Calcium: 9 mg/dL (ref 8.4–10.5)
GFR: 42.56 mL/min — ABNORMAL LOW (ref >60.00)
Sodium: 136 mEq/L (ref 135–145)

## 2011-10-15 NOTE — Patient Instructions (Addendum)
Your physician recommends that you have lab work today--BMET/BNP  Your physician recommends that you return for a FASTING lipid profile /liver profile in November.   Your physician has recommended that you have a pulmonary function test. Pulmonary Function Tests are a group of tests that measure how well air moves in and out of your lungs.  Your physician has recommended that you have a sleep study. This test records several body functions during sleep, including: brain activity, eye movement, oxygen and carbon dioxide blood levels, heart rate and rhythm, breathing rate and rhythm, the flow of air through your mouth and nose, snoring, body muscle movements, and chest and belly movement.   Your physician recommends that you schedule a follow-up appointment in: 2 months with Dr Shirlee Latch

## 2011-10-15 NOTE — Telephone Encounter (Signed)
Error

## 2011-10-15 NOTE — Progress Notes (Signed)
Patient ID: Joshua Vincent, male   DOB: Mar 10, 1934, 77 y.o.   MRN: [id number] PCP: Dr. Clovis Riley  77 yo with history of diastolic CHF, CAD s/p NSTEMI in 12/10 accompanied by pulmonary edema and peri-catheterization CVA, now s/p CABG presents for followup.  He was admitted in 8/13 with NSTEMI.  LHC showed severe disease in the RCA and occlusion of the LAD and LCX.  SVG-D and SVG-RCA were occluded.  SVG-OM and LIMA-LAD were patent.  EF was 45% by LV-gram but echo done in 9/13 showed EF 55-60% . He was managed medically.  He also has had cellulitis related to a left lower leg injury from work.   He was recently readmitted in 9/13 with hypertensive emergency and acute on chronic diastolic CHF.  He had not been taking his medications due to financial strains.  He was admitted and diuresed and discharged on an adjusted regimen to try to control medication costs.  He now has home health coming in twice a week and wound care followup for his leg .   Since discharge, he reports that his dyspnea is considerably better.  It is still, however, significant.  He is short of breath after walking about 100-200 feet.  He is ok walking around his house.  Weight is down 4 lbs.  Now, his main limitation seems to be hip and back pain.  No orthopnea (this is improved).  No further chest pain.  BP is now much better controlled.  He had overnight oximetry arranged by PCP with significant desaturation.  However, oxygen saturation today at rest was 97% and the lowest with ambulation was 95%.  He has been having problems with back pain, but this has been improved by a Tens unit.  Finally, he reports loud snoring.  His wife is concerned that he may have sleep apnea.    ECG: NSR, LAFB, RBBB  Labs (5/11): BNP 520, K 4.1, creatinine 1.0 Labs (8/11): BNP 120, HDL 34, LDL 148 Labs (9/11): K 4.7, creatinine 1.2 Labs (12/11): K 3.3 => 5.3, creatinine 0.9 => 1.3, BNP 918 => 199 Labs (2/12): K 3.9, creatinine 1.11 Labs (6/12): K 4.3,  creatinine 1.0 Labs (8/13): LDL 142, HDL 40 (not taking statin) Labs (9/13): K 3.8, creatinine 1.46 => 1.6, BNP 385  Allergies (verified):  1)  ! Niacin  Past Medical History: 1. HYPERTENSION (ICD-401.9) 2. DYSLIPIDEMIA (ICD-272.4) 3. BENIGN PROSTATIC HYPERTROPHY, HX OF (ICD-V13.8) 4. DIABETIC PERIPHERAL NEUROPATHY (ICD-250.60) 5. DM (ICD-250.00) 6. OSTEOARTHRITIS, SHOULDER, RIGHT (ICD-715.91) 7. TOBACCO USE, QUIT (ICD-V15.82) 8. CAD: NSTEMI (12/10).  LHC showed sequential 90% mid LAD stenoses and 90% stenosis at the mid to distal LAD, D1 totally occluded, OM1 subtotally occluded, 70% mid CFX, EF 50%.  CABG (1/11): LIMA-LAD, SVG-D, SVG-OM, SVG-PLV.  NSTEMI 8/13 with LHC showing total occlusion of the LAD and LCX, 80-90% mPDA, 90% PLV, occluded SVG-RCA, occluded SVG-D, patent SVG-OM and patent LIMA-LAD.  He was managed medically.  9.  CVA: peri-catheterization in 12/10.  Patient initially developed double vision and MRI showed right dorsal mid-brain infarct.  Carotid dopplers with no significant disease.  Double vision has resolved.  10.  Diastolic CHF: Pulmonary edema with NSTEMI.  Echo (12/10) showed EF 50-55% with mid to apical septal HK and mild mitral regurgitation.  Echo (3/11) when admitted with CHF exacerbation: Moderate LVH, EF 60%, valves ok.  LV-gram with EF 45% in 8/13.  Echo (9/13) with EF 55-60%, mild MR.  11. Bladder cancer s/p resection 7/11 12. 3 week  event monitor (8/11): no significant arrhythmias, occasional PVCs 13. CKD 14. Nocturnal hypoxemia   Family History: Father died at age 78 from an embolic cerebrovascular accident from a deep vein thrombosis in his lower extremity.  His  brother died in his 61s from similar circumstances of an embolic CVA from a lower extremity deep vein thrombosis.  Mother died at age 45 without health problems prior to that.  The patient has a sister who died in her 61s from sequelae of a war injury.  He has a sister who is alive and is 58 years  old with osteoarthritis.  He has 3 sons who are alive and healthy.   Social History: The patient is married.  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  He has had a distant smoking history, 50-pack years and quit 20 years ago.  He drinks alcohol occasionally.  He does not use drugs.   ROS:  All systems reviewed and negative except as per HPI.    Current Outpatient Prescriptions  Medication Sig Dispense Refill  . amLODipine (NORVASC) 10 MG tablet Take 10 mg by mouth daily.      Marland Kitchen aspirin EC 81 MG tablet Take 81 mg by mouth daily.      Marland Kitchen atorvastatin (LIPITOR) 80 MG tablet Take 1 tablet (80 mg total) by mouth at bedtime.  30 tablet  6  . carvedilol (COREG) 25 MG tablet Take 1 tablet (25 mg total) by mouth 2 (two) times daily with a meal.  60 tablet  11  . collagenase (SANTYL) ointment Apply topically daily.  15 g  1  . doxazosin (CARDURA) 8 MG tablet Take 1 tablet (8 mg total) by mouth at bedtime.  90 tablet  3  . furosemide (LASIX) 40 MG tablet Take 1 tablet (40 mg total) by mouth 2 (two) times daily.  60 tablet  6  . glipiZIDE (GLUCOTROL) 5 MG tablet Take 1 tablet (5 mg total) by mouth 2 (two) times daily before a meal.  60 tablet  11  . lisinopril (PRINIVIL,ZESTRIL) 5 MG tablet Take 1 tablet (5 mg total) by mouth daily.  30 tablet  11  . metFORMIN (GLUCOPHAGE) 1000 MG tablet Take 1 tablet (1,000 mg total) by mouth 2 (two) times daily with a meal.  60 tablet  11  . nitroGLYCERIN (NITROSTAT) 0.4 MG SL tablet Place 1 tablet (0.4 mg total) under the tongue every 5 (five) minutes as needed. For chest pain  25 tablet  3  . omega-3 acid ethyl esters (LOVAZA) 1 G capsule Take 1 g by mouth daily.      . potassium chloride SA (K-DUR,KLOR-CON) 20 MEQ tablet Take 1 tablet (20 mEq total) by mouth 2 (two) times daily.  60 tablet  6  . traMADol (ULTRAM) 50 MG tablet Take 50 mg by mouth every 6 (six) hours as needed. For pain      . vitamin B-12 (CYANOCOBALAMIN) 50 MCG  tablet Take 50 mcg by mouth daily.        BP 138/68  Pulse 74  Ht 5\' 11"  (1.803 m)  Wt 199 lb (90.266 kg)  BMI 27.75 kg/m2  SpO2 95% General:  Well developed, well nourished, in no acute distress. Neck:  Neck supple, JVP 7 cm. No masses, thyromegaly or abnormal cervical nodes. Lungs:  Clear bilaterally to auscultation and percussion. Heart:  Non-displaced PMI, chest non-tender; regular rate and rhythm, S1, S2 without murmurs, rubs or  gallops. Carotid upstroke normal, no bruit.  Trace ankle edema on right, left leg is wrapped. Abdomen:  Bowel sounds positive; abdomen soft and non-tender without masses, organomegaly, or hernias noted. No hepatosplenomegaly. Extremities:  No clubbing or cyanosis. Neurologic:  Alert and oriented x 3. Psych:  Normal affect.  Assessment/Plan: 1. CAD: Status post CABG.  Cath 8/13 showed 2/4 patent grafts.  He was managed medically. No further chest pain.  Continue ASA 81, atorvastatin, Coreg, ACEI.  He does not want to do cardiac rehab due to difficulty driving to the hospital. 2. Chronic diastolic CHF: EF normal on most recent echo.  He continues to have NYHA class III symptoms but improved with no orthopnea.  Volume looks stable.  He does not appear significantly overloaded.  He has been taking his Lasix 40 mg bid and will continue this dosing.  Check BMET today. Continue BP control.  3. CKD: BMET today.  4. HTN: BP under much better control.  Continue current meds.  5. Hyperlipidemia: Lipids/LFTs in 11/13.  Goal LDL < 70.  6. OSA: Symptoms are concerning for OSA.  I set her up for a sleep study.  7. Nocturnal hypoxemia: She will qualify for nocturnal oxygen, which I will arrange.  She may need CPAP (see #6).  8. H/o smoking: I suspect that some of Mr Emminger's dyspnea may be due to COPD.  He no longer smokes.  I will get PFTs.  If he has significant COPD, will try him on Spiriva.   Marca Ancona 10/15/2011 5:20 PM

## 2011-10-18 ENCOUNTER — Encounter (HOSPITAL_BASED_OUTPATIENT_CLINIC_OR_DEPARTMENT_OTHER): Payer: Worker's Compensation | Attending: Internal Medicine

## 2011-10-18 DIAGNOSIS — E7800 Pure hypercholesterolemia, unspecified: Secondary | ICD-10-CM | POA: Insufficient documentation

## 2011-10-18 DIAGNOSIS — I872 Venous insufficiency (chronic) (peripheral): Secondary | ICD-10-CM | POA: Insufficient documentation

## 2011-10-18 DIAGNOSIS — Z951 Presence of aortocoronary bypass graft: Secondary | ICD-10-CM | POA: Insufficient documentation

## 2011-10-18 DIAGNOSIS — E1149 Type 2 diabetes mellitus with other diabetic neurological complication: Secondary | ICD-10-CM | POA: Insufficient documentation

## 2011-10-18 DIAGNOSIS — Z79899 Other long term (current) drug therapy: Secondary | ICD-10-CM | POA: Insufficient documentation

## 2011-10-18 DIAGNOSIS — Z8546 Personal history of malignant neoplasm of prostate: Secondary | ICD-10-CM | POA: Insufficient documentation

## 2011-10-18 DIAGNOSIS — E1142 Type 2 diabetes mellitus with diabetic polyneuropathy: Secondary | ICD-10-CM | POA: Insufficient documentation

## 2011-10-18 LAB — GLUCOSE, CAPILLARY: Glucose-Capillary: 381 mg/dL — ABNORMAL HIGH (ref 70–99)
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
## 2011-10-25 LAB — GLUCOSE, CAPILLARY: Glucose-Capillary: 267 mg/dL — ABNORMAL HIGH (ref 70–99)

## 2011-10-30 ENCOUNTER — Other Ambulatory Visit: Payer: Self-pay

## 2011-11-05 ENCOUNTER — Encounter: Payer: Self-pay | Admitting: Cardiology

## 2011-11-07 ENCOUNTER — Ambulatory Visit (HOSPITAL_BASED_OUTPATIENT_CLINIC_OR_DEPARTMENT_OTHER): Payer: Medicare Other | Attending: Cardiology | Admitting: Radiology

## 2011-11-07 VITALS — Ht 72.0 in | Wt 202.0 lb

## 2011-11-07 DIAGNOSIS — G4733 Obstructive sleep apnea (adult) (pediatric): Secondary | ICD-10-CM

## 2011-11-07 DIAGNOSIS — I4949 Other premature depolarization: Secondary | ICD-10-CM | POA: Insufficient documentation

## 2011-11-07 DIAGNOSIS — R0602 Shortness of breath: Secondary | ICD-10-CM

## 2011-11-12 ENCOUNTER — Other Ambulatory Visit: Payer: Self-pay

## 2011-11-15 ENCOUNTER — Encounter (HOSPITAL_BASED_OUTPATIENT_CLINIC_OR_DEPARTMENT_OTHER): Payer: Worker's Compensation | Attending: Internal Medicine

## 2011-11-15 DIAGNOSIS — L97809 Non-pressure chronic ulcer of other part of unspecified lower leg with unspecified severity: Secondary | ICD-10-CM | POA: Insufficient documentation

## 2011-11-15 DIAGNOSIS — E1169 Type 2 diabetes mellitus with other specified complication: Secondary | ICD-10-CM | POA: Insufficient documentation

## 2011-11-15 DIAGNOSIS — I872 Venous insufficiency (chronic) (peripheral): Secondary | ICD-10-CM | POA: Insufficient documentation

## 2011-11-19 IMAGING — CR DG CHEST 2V
2 series · 2 of 2 positions shown · non-contrast
Comparison: 12/16/2008

CLINICAL DATA: Myocardial infarction.

CHEST - 2 VIEW

[w chest pa]
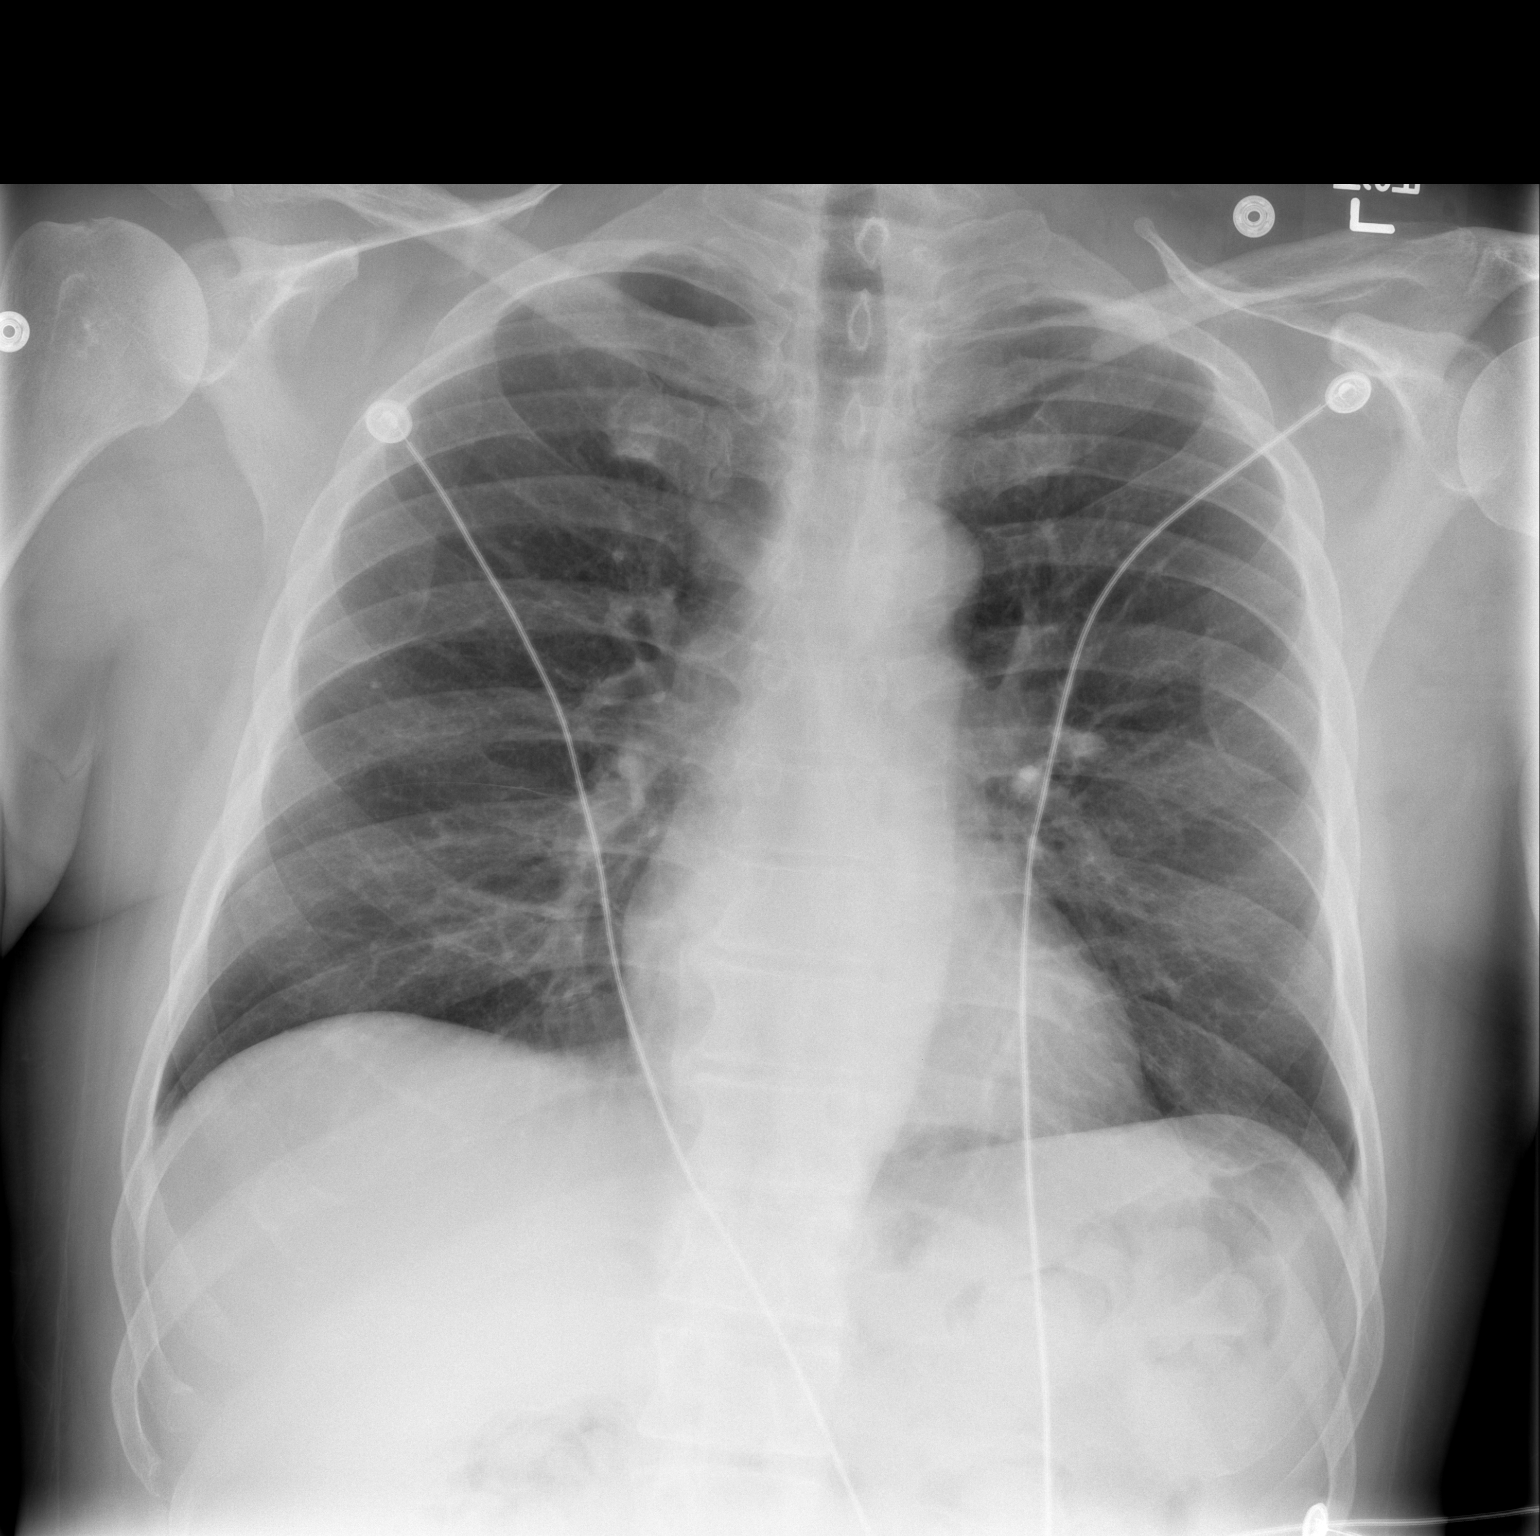

[w chest lat]
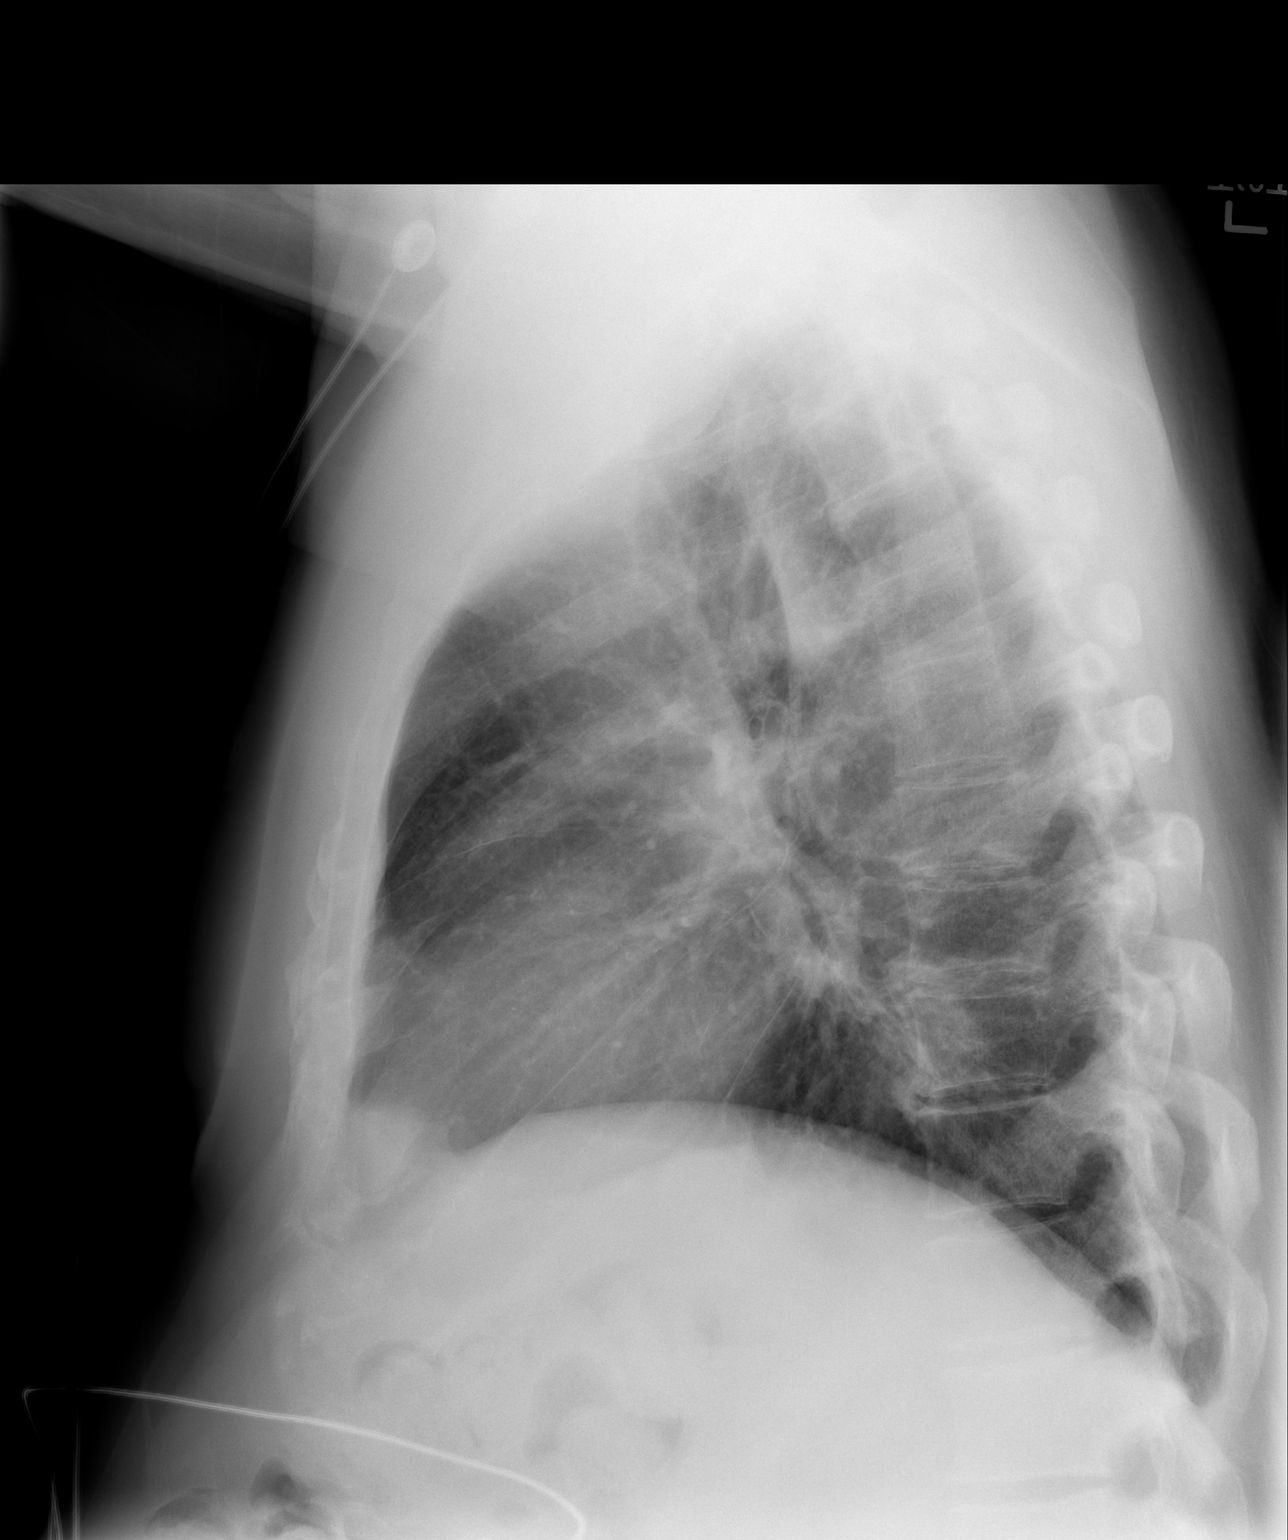

[2 of 2 positions shown; findings below may reference images not displayed]

FINDINGS: The cardiac silhouette, mediastinal and hilar contours
are within normal limits.  The lungs are clear.  Resolution of
pulmonary edema seen on the prior study.  No pleural effusions.  No
pneumothorax.  The bony thorax is intact
IMPRESSION: No acute cardiopulmonary findings.  Resolution of pulmonary edema
and atelectasis seen on the prior study.

## 2011-11-20 DIAGNOSIS — G4710 Hypersomnia, unspecified: Secondary | ICD-10-CM

## 2011-11-20 DIAGNOSIS — G4730 Sleep apnea, unspecified: Secondary | ICD-10-CM

## 2011-11-20 NOTE — Procedures (Cosign Needed)
NAMECLARENCE, Joshua Vincent                ACCOUNT NO.:  [id number]  MEDICAL RECORD NO.:  [id number]          PATIENT TYPE:  OUT  LOCATION:  SLEEP CENTER                 FACILITY:  [facility]  PHYSICIAN:  Barbaraann Share, MD,FCCPDATE OF BIRTH:  1934/07/15  DATE OF STUDY:  11/07/2011                           NOCTURNAL POLYSOMNOGRAM  REFERRING PHYSICIAN:  Marca Ancona, MD  LOCATION:  Sleep Lab.  INDICATION FOR STUDY:  Hypersomnia with sleep apnea.  EPWORTH SLEEPINESS SCORE:  9.  MEDICATIONS:  SLEEP ARCHITECTURE:  The patient had a total sleep time of 240 minutes with no slow-wave sleep and only 35 minutes of REM.  Sleep onset latency was prolonged at 109 minutes and REM onset was fairly normal at 75 minutes.  Sleep efficiency was severely decreased at 61%.  RESPIRATORY DATA:  The patient was found to have 58 apneas and 84 obstructive hypopneas, giving him an apnea-hypopnea index of 36 events per hour.  The events occurred in all body positions and there was moderate snoring noted throughout.  OXYGEN DATA:  There was O2 desaturation as low as 81% with the patient's obstructive events.  CARDIAC DATA:  Occasional PVCs noted, but no clinically significant arrhythmias were seen.  MOVEMENT-PARASOMNIA:  The patient had no significant leg jerks or other abnormal behaviors seen.  IMPRESSIONS-RECOMMENDATIONS: 1. Moderate-to-severe obstructive sleep apnea/hypopnea syndrome, with     an AHI of 36 events per hour and oxygen desaturation as low as 81%.     Treatment for this degree of sleep apnea should focus primarily on     CPAP     as well as modest weight loss. 2. Occasional premature ventricular contraction noted, but no     clinically significant arrhythmias were seen.     Barbaraann Share, MD,FCCP Diplomate, American Board of Sleep Medicine    [facility]/MEDQ  D:  11/20/2011 08:05:34  T:  11/20/2011 22:53:46  Job:  [id number]

## 2011-11-22 ENCOUNTER — Encounter (HOSPITAL_BASED_OUTPATIENT_CLINIC_OR_DEPARTMENT_OTHER): Payer: Worker's Compensation

## 2011-11-23 ENCOUNTER — Telehealth: Payer: Self-pay | Admitting: *Deleted

## 2011-11-23 DIAGNOSIS — G4730 Sleep apnea, unspecified: Secondary | ICD-10-CM

## 2011-11-23 NOTE — Telephone Encounter (Signed)
11/23/11 LMTCB

## 2011-11-23 NOTE — Telephone Encounter (Signed)
Joshua Vincent needs referral to pulmonary to start CPAP. ----- Message ----- From: Barbaraann Share, MD Sent: 11/21/2011 9:11 AM To: Laurey Morale, MD

## 2011-11-23 NOTE — Telephone Encounter (Signed)
Pt notified, agreed to referral to pulmonary for CPAP.

## 2011-11-23 NOTE — Telephone Encounter (Signed)
Pt returning nurse call, he can be reached at [phone]

## 2011-11-28 ENCOUNTER — Other Ambulatory Visit: Payer: Self-pay | Admitting: *Deleted

## 2011-11-28 MED ORDER — POTASSIUM CHLORIDE CRYS ER 20 MEQ PO TBCR: 20.0000 meq | EXTENDED_RELEASE_TABLET | Freq: Two times a day (BID) | ORAL | Status: DC

## 2011-11-29 LAB — GLUCOSE, CAPILLARY: Glucose-Capillary: 299 mg/dL — ABNORMAL HIGH (ref 70–99)

## 2011-11-30 ENCOUNTER — Other Ambulatory Visit: Payer: Self-pay | Admitting: Cardiology

## 2011-12-10 ENCOUNTER — Encounter (HOSPITAL_BASED_OUTPATIENT_CLINIC_OR_DEPARTMENT_OTHER): Payer: Worker's Compensation | Attending: Internal Medicine

## 2011-12-10 DIAGNOSIS — L97809 Non-pressure chronic ulcer of other part of unspecified lower leg with unspecified severity: Secondary | ICD-10-CM | POA: Insufficient documentation

## 2011-12-10 DIAGNOSIS — I872 Venous insufficiency (chronic) (peripheral): Secondary | ICD-10-CM | POA: Insufficient documentation

## 2011-12-10 DIAGNOSIS — E1169 Type 2 diabetes mellitus with other specified complication: Secondary | ICD-10-CM | POA: Insufficient documentation

## 2011-12-11 DIAGNOSIS — I2510 Atherosclerotic heart disease of native coronary artery without angina pectoris: Secondary | ICD-10-CM

## 2011-12-12 ENCOUNTER — Telehealth: Payer: Self-pay | Admitting: Cardiology
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
## 2011-12-14 ENCOUNTER — Ambulatory Visit (INDEPENDENT_AMBULATORY_CARE_PROVIDER_SITE_OTHER): Payer: Managed Care, Other (non HMO) | Admitting: Pulmonary Disease

## 2011-12-14 ENCOUNTER — Encounter: Payer: Self-pay | Admitting: Pulmonary Disease

## 2011-12-14 VITALS — BP 128/82 | HR 95 | Temp 98.1°F | Ht 72.0 in | Wt 208.8 lb

## 2011-12-14 DIAGNOSIS — G4733 Obstructive sleep apnea (adult) (pediatric): Secondary | ICD-10-CM

## 2011-12-14 NOTE — Progress Notes (Signed)
  Subjective:    Patient ID: Joshua Vincent, male    DOB: 1934/07/17, 77 y.o.   MRN: [id number]  HPI The patient is a 77 year old male who I've been asked to see for management of obstructive sleep apnea.  He underwent a sleep study in October of this year, where he was found to have moderate to severe OSA with an AHI of 36 events per hour.  The patient is been noted to have loud snoring, as well as an abnormal breathing pattern during sleep.  He does not have frequent awakenings, but is not rested in the mornings upon arising.  He does do significant sleep pressure during the day with periods of inactivity, and can fall asleep watching TV in the evenings unless it is very interesting.  He denies any issues with sleepiness while driving except on occasions.  He tells me that his weight is down 10 pounds over the last 2 years, and his Epworth score today is 14.  Sleep Questionnaire: What time do you typically go to bed?( Between what hours) 11-2am How long does it take you to fall asleep? 5 minutes How many times during the night do you wake up? 2 What time do you get out of bed to start your day? 0900 Do you drive or operate heavy machinery in your occupation? No How much has your weight changed (up or down) over the past two years? (In pounds) 10 lb (4.536 kg) Have you ever had a sleep study before? Yes If yes, location of study? [facility] If yes, date of study? 11/07/11 Do you currently use CPAP? No Do you wear oxygen at any time? Yes O2 Flow Rate (L/min) 3 L/min    Review of Systems  Constitutional: Negative for fever and unexpected weight change.  HENT: Positive for congestion and sore throat. Negative for ear pain, nosebleeds, rhinorrhea, sneezing, trouble swallowing, dental problem, postnasal drip and sinus pressure.   Eyes: Negative for redness and itching.  Respiratory: Positive for shortness of breath. Negative for cough, chest tightness and wheezing.   Cardiovascular: Positive for leg  swelling ( d/t injury--open wounds---ran over by fork lift). Negative for palpitations.  Gastrointestinal: Negative for nausea and vomiting.       Acid reflux/indigestion  Genitourinary: Negative for dysuria.  Musculoskeletal: Negative for joint swelling.  Skin: Positive for rash ( itching).  Neurological: Negative for headaches.  Hematological: Does not bruise/bleed easily.  Psychiatric/Behavioral: Negative for dysphoric mood. The patient is not nervous/anxious.        Objective:   Physical Exam Constitutional:  Well developed, no acute distress  HENT:  Nares patent without discharge  Oropharynx without exudate, palate and uvula are moderately elongated  Eyes:  Perrla, eomi, no scleral icterus  Neck:  No JVD, no TMG  Cardiovascular:  Normal rate, regular rhythm, no rubs or gallops.  No murmurs        Intact distal pulses  Pulmonary :  Normal breath sounds, no stridor or respiratory distress   No rales, rhonchi, or wheezing  Abdominal:  Soft, nondistended, bowel sounds present.  No tenderness noted.   Musculoskeletal:  1+ left lower extremity edema noted.  Lymph Nodes:  No cervical lymphadenopathy noted  Skin:  No cyanosis noted  Neurologic:  Alert, appropriate, moves all 4 extremities without obvious deficit.         Assessment & Plan:

## 2011-12-14 NOTE — Assessment & Plan Note (Signed)
The patient has moderate to severe obstructive sleep apnea, and has significant underlying medical comorbidities that may be affected by this.  I have had a long discussion with him about sleep apnea, including its impact to his quality of life and cardiovascular health.  I would recommend a trial of CPAP while working on weight loss, and the patient is agreeable. I will set the patient up on cpap at a moderate pressure level to allow for desensitization, and will troubleshoot the device over the next 4-6weeks if needed.  The pt is to call me if having issues with tolerance.  Will then optimize the pressure once patient is able to wear cpap on a consistent basis.

## 2011-12-14 NOTE — Patient Instructions (Addendum)
Will start on cpap at a moderate pressure level.  Please call if having tolerance issues. Work on weight loss followup with me in 6weeks.

## 2011-12-19 ENCOUNTER — Encounter: Payer: Self-pay | Admitting: Cardiology

## 2011-12-19 ENCOUNTER — Ambulatory Visit (INDEPENDENT_AMBULATORY_CARE_PROVIDER_SITE_OTHER): Payer: Managed Care, Other (non HMO) | Admitting: Cardiology

## 2011-12-19 VITALS — BP 138/70 | HR 76 | Ht 72.0 in | Wt 203.5 lb

## 2011-12-19 DIAGNOSIS — R0602 Shortness of breath: Secondary | ICD-10-CM

## 2011-12-19 DIAGNOSIS — I2510 Atherosclerotic heart disease of native coronary artery without angina pectoris: Secondary | ICD-10-CM

## 2011-12-19 DIAGNOSIS — I5032 Chronic diastolic (congestive) heart failure: Secondary | ICD-10-CM

## 2011-12-19 DIAGNOSIS — I25810 Atherosclerosis of coronary artery bypass graft(s) without angina pectoris: Secondary | ICD-10-CM

## 2011-12-19 DIAGNOSIS — I10 Essential (primary) hypertension: Secondary | ICD-10-CM

## 2011-12-19 LAB — BASIC METABOLIC PANEL
Calcium: 9 mg/dL (ref 8.4–10.5)
GFR: 53.82 mL/min — ABNORMAL LOW (ref >60.00)
Glucose, Bld: 119 mg/dL — ABNORMAL HIGH (ref 70–99)
Sodium: 139 mEq/L (ref 135–145)

## 2011-12-19 LAB — LIPID PANEL
Cholesterol: 171 mg/dL (ref 0–200)
Total CHOL/HDL Ratio: 5
Triglycerides: 123 mg/dL (ref 0.0–149.0)

## 2011-12-19 NOTE — Telephone Encounter (Signed)
Pt had appt with Dr Shirlee Latch 12/19/11.

## 2011-12-19 NOTE — Patient Instructions (Addendum)
Your physician recommends that you have lab today--lipid profile/BMET/BNP.  Your physician has recommended that you have a pulmonary function test. Pulmonary Function Tests are a group of tests that measure how well air moves in and out of your lungs.   Your physician recommends that you schedule a follow-up appointment in: 3 months with Dr Shirlee Latch.

## 2011-12-19 NOTE — Progress Notes (Signed)
Patient ID: Joshua Vincent, male   DOB: 01-Apr-1934, 77 y.o.   MRN: [id number] PCP: Dr. Clovis Riley  77 yo with history of diastolic CHF, CAD s/p NSTEMI in 12/10 accompanied by pulmonary edema and peri-catheterization CVA, now s/p CABG presents for followup.  He was admitted in 8/13 with NSTEMI.  LHC showed severe disease in the RCA and occlusion of the LAD and LCX.  SVG-D and SVG-RCA were occluded.  SVG-OM and LIMA-LAD were patent.  EF was 45% by LV-gram but echo done in 9/13 showed EF 55-60% . He was managed medically.  He also has had cellulitis related to a left lower leg injury from work.   He was recently readmitted in 9/13 with hypertensive emergency and acute on chronic diastolic CHF.  He had not been taking his medications due to financial strains.  He was admitted and diuresed and discharged on an adjusted regimen to try to control medication costs.  He now has home health coming in twice a week and wound care followup for his leg .   Since discharge, he reports that his dyspnea is considerably better.  He gets short of breath after walking about 100 yards.  He can climb a flight of steps without stopping. Now, his main limitation seems to be hip and back pain.  No orthopnea (this is improved).  No further chest pain.  BP is now much better controlled.  He had moderate to severe OSA on sleep study, awaiting CPAP arrival.   Labs (5/11): BNP 520, K 4.1, creatinine 1.0 Labs (8/11): BNP 120, HDL 34, LDL 148 Labs (9/11): K 4.7, creatinine 1.2 Labs (12/11): K 3.3 => 5.3, creatinine 0.9 => 1.3, BNP 918 => 199 Labs (2/12): K 3.9, creatinine 1.11 Labs (6/12): K 4.3, creatinine 1.0 Labs (8/13): LDL 142, HDL 40 (not taking statin) Labs (9/13): K 3.8, creatinine 1.46 => 1.6, BNP 385  Allergies (verified):  1)  ! Niacin  Past Medical History: 1. HYPERTENSION (ICD-401.9) 2. DYSLIPIDEMIA (ICD-272.4) 3. BENIGN PROSTATIC HYPERTROPHY, HX OF (ICD-V13.8) 4. DIABETIC PERIPHERAL NEUROPATHY (ICD-250.60) 5. DM  (ICD-250.00) 6. OSTEOARTHRITIS, SHOULDER, RIGHT (ICD-715.91) 7. TOBACCO USE, QUIT (ICD-V15.82) 8. CAD: NSTEMI (12/10).  LHC showed sequential 90% mid LAD stenoses and 90% stenosis at the mid to distal LAD, D1 totally occluded, OM1 subtotally occluded, 70% mid CFX, EF 50%.  CABG (1/11): LIMA-LAD, SVG-D, SVG-OM, SVG-PLV.  NSTEMI 8/13 with LHC showing total occlusion of the LAD and LCX, 80-90% mPDA, 90% PLV, occluded SVG-RCA, occluded SVG-D, patent SVG-OM and patent LIMA-LAD.  He was managed medically.  9.  CVA: peri-catheterization in 12/10.  Patient initially developed double vision and MRI showed right dorsal mid-brain infarct.  Carotid dopplers with no significant disease.  Double vision has resolved.  10.  Diastolic CHF: Pulmonary edema with NSTEMI.  Echo (12/10) showed EF 50-55% with mid to apical septal HK and mild mitral regurgitation.  Echo (3/11) when admitted with CHF exacerbation: Moderate LVH, EF 60%, valves ok.  LV-gram with EF 45% in 8/13.  Echo (9/13) with EF 55-60%, mild MR.  11. Bladder cancer s/p resection 7/11 12. 3 week event monitor (8/11): no significant arrhythmias, occasional PVCs 13. CKD 14. Nocturnal hypoxemia 15. OSA: Start CPAP.    Family History: Father died at age 32 from an embolic cerebrovascular accident from a deep vein thrombosis in his lower extremity.  His  brother died in his 45s from similar circumstances of an embolic CVA from a lower extremity deep vein thrombosis.  Mother died  at age 12 without health problems prior to that.  The patient has a sister who died in her 7s from sequelae of a war injury.  He has a sister who is alive and is 32 years old with osteoarthritis.  He has 3 sons who are alive and healthy.   Social History: The patient is married.  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  He has had a distant smoking history, 50-pack years and quit 20 years ago.  He drinks alcohol occasionally.  He does not use drugs.    ROS:  All systems reviewed and negative except as per HPI.   Current Outpatient Prescriptions  Medication Sig Dispense Refill  . amLODipine (NORVASC) 10 MG tablet Take 10 mg by mouth daily.      . Ascorbic Acid (VITAMIN C PO) Take 500 mg by mouth daily.      Marland Kitchen aspirin EC 81 MG tablet Take 81 mg by mouth daily.      Marland Kitchen atorvastatin (LIPITOR) 80 MG tablet Take 1 tablet (80 mg total) by mouth at bedtime.  30 tablet  6  . carvedilol (COREG) 25 MG tablet Take 1 tablet (25 mg total) by mouth 2 (two) times daily with a meal.  60 tablet  11  . Cholecalciferol (VITAMIN D PO) Take 1 tablet by mouth daily.      . collagenase (SANTYL) ointment Apply topically once a week.      . doxazosin (CARDURA) 8 MG tablet Take 1 tablet (8 mg total) by mouth at bedtime.  90 tablet  3  . furosemide (LASIX) 40 MG tablet Take 1 tablet (40 mg total) by mouth 2 (two) times daily.  60 tablet  6  . glipiZIDE (GLUCOTROL) 5 MG tablet Take 1 tablet (5 mg total) by mouth 2 (two) times daily before a meal.  60 tablet  11  . Insulin Glargine (LANTUS SOLOSTAR Aline) Inject 30 Units into the skin daily.      . metFORMIN (GLUCOPHAGE) 1000 MG tablet Take 1 tablet (1,000 mg total) by mouth 2 (two) times daily with a meal.  60 tablet  11  . nabumetone (RELAFEN) 500 MG tablet Take 500 mg by mouth daily.      . naproxen (NAPROSYN) 375 MG tablet Take 375 mg by mouth daily as needed.      . nitroGLYCERIN (NITROSTAT) 0.4 MG SL tablet Place 1 tablet (0.4 mg total) under the tongue every 5 (five) minutes as needed. For chest pain  25 tablet  3  . potassium chloride SA (K-DUR,KLOR-CON) 20 MEQ tablet Take 1 tablet (20 mEq total) by mouth 2 (two) times daily.  180 tablet  3  . QUINAPRIL HCL PO Take 1 tablet by mouth daily.      . traMADol (ULTRAM) 50 MG tablet Take 50 mg by mouth every 6 (six) hours as needed. For pain      . VITAMIN A PO Take 1 tablet by mouth daily.      . vitamin B-12 (CYANOCOBALAMIN) 50 MCG tablet Take 50 mcg by mouth daily.       Marland Kitchen VITAMIN E PO Take 1 tablet by mouth daily.        BP 138/70  Pulse 76  Ht 6' (1.829 m)  Wt 203 lb 8 oz (92.307 kg)  BMI 27.60 kg/m2 General:  Well developed, well nourished, in no acute distress. Neck:  Neck supple, JVP 7 cm. No masses, thyromegaly  or abnormal cervical nodes. Lungs:  Clear bilaterally to auscultation and percussion. Heart:  Non-displaced PMI, chest non-tender; regular rate and rhythm, S1, S2 without murmurs, rubs or gallops. Carotid upstroke normal, no bruit.  Trace ankle edema on right, left leg is wrapped. Abdomen:  Bowel sounds positive; abdomen soft and non-tender without masses, organomegaly, or hernias noted. No hepatosplenomegaly. Extremities:  No clubbing or cyanosis. Neurologic:  Alert and oriented x 3. Psych:  Normal affect.  Assessment/Plan: 1. CAD: Status post CABG.  Cath 8/13 showed 2/4 patent grafts.  He was managed medically. No further chest pain.  Continue ASA 81, atorvastatin, Coreg, ACEI.   2. Chronic diastolic CHF: EF normal on most recent echo.  NYHA class II symptoms.  Volume looks stable.  He does not appear significantly overloaded.  He has been taking his Lasix 40 mg bid and will continue this dosing.  Check BMET/BNP today. Continue BP control.  3. CKD: BMET today.  4. HTN: BP under much better control.  Continue current meds.  5. Hyperlipidemia: Lipids today.  Continue atorvastatin.  6. OSA: Awaiting CPAP arrival. 7. H/o smoking: I suspect that some of Mr Takaki's dyspnea may be due to COPD.  He no longer smokes.  I will arrange for PFTs (had wanted before, not done yet).  If he has significant COPD, will try him on Spiriva.   Marca Ancona 12/20/2011

## 2012-01-09 IMAGING — CR DG CHEST 1V PORT
1 series · 1 of 1 positions shown · non-contrast
Comparison: 02/08/2009

CLINICAL DATA: Coronary artery disease, status post CABG

PORTABLE CHEST - 1 VIEW

[view not recorded]
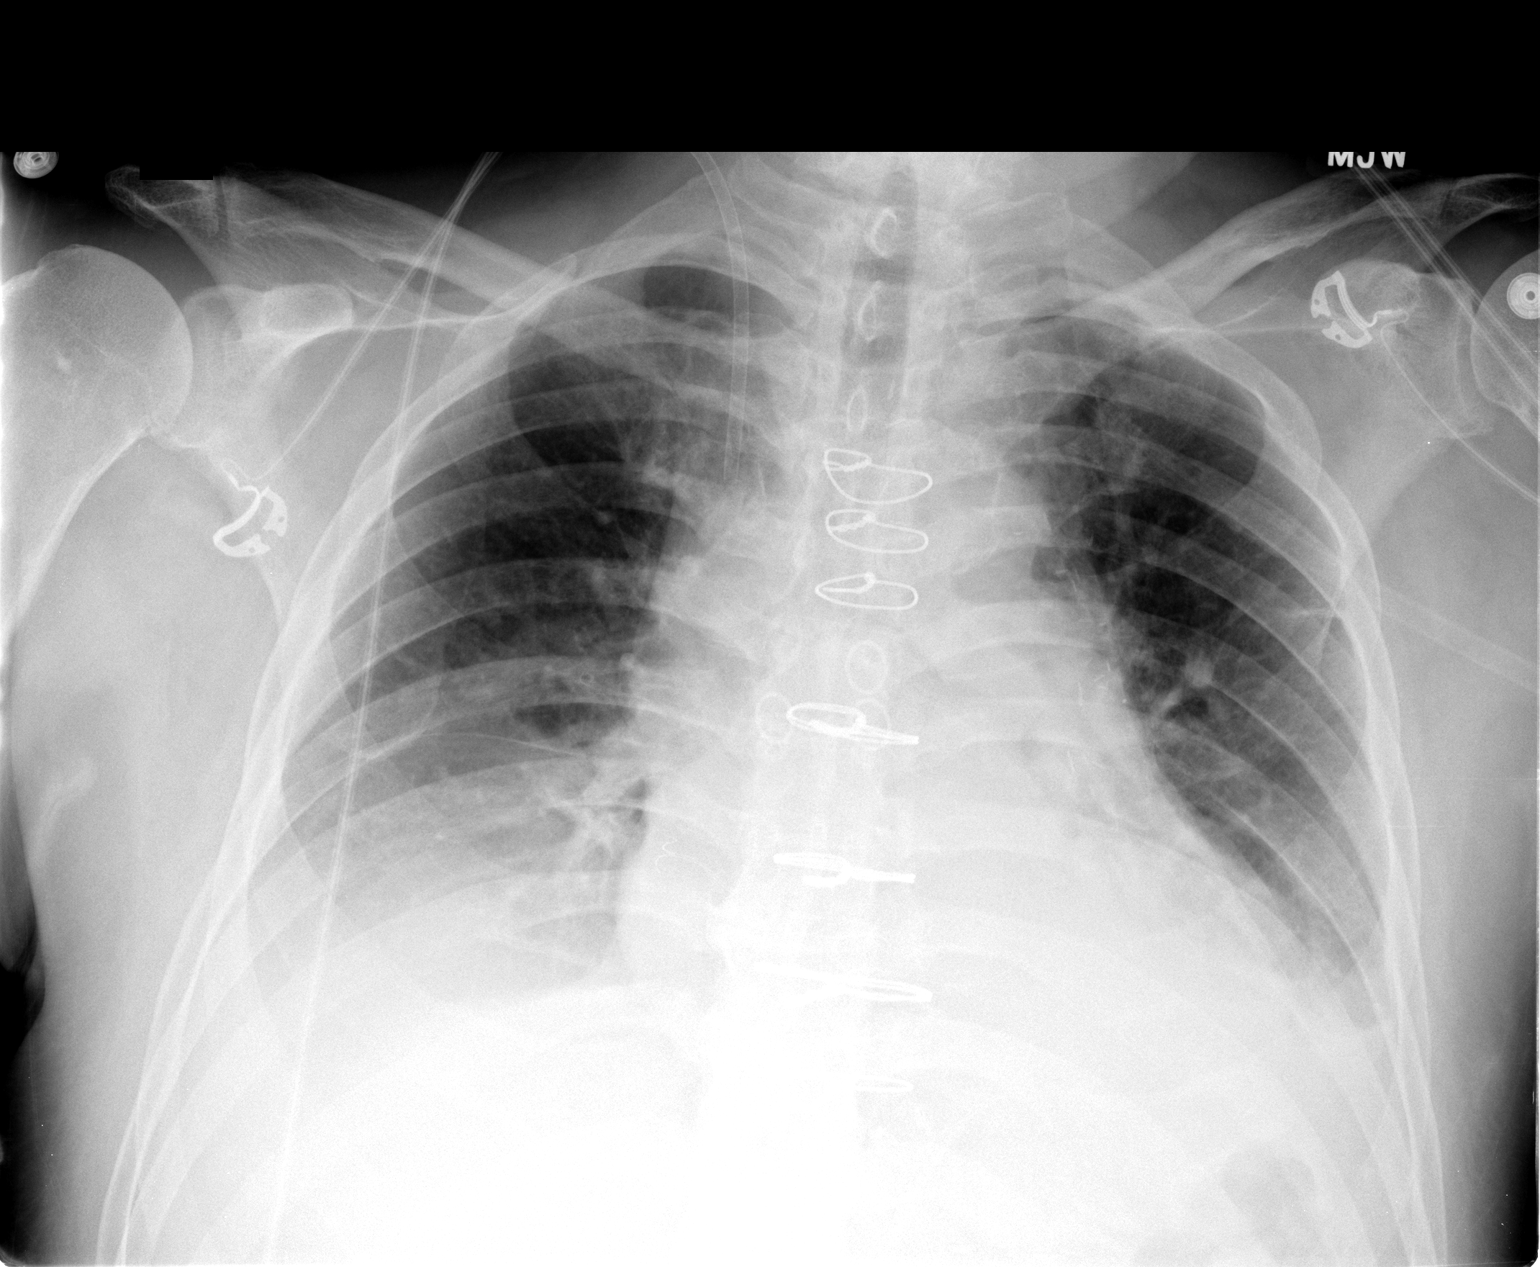

[1 of 1 positions shown; findings below may reference images not displayed]

FINDINGS: Right IJ central line has been removed and the sheath
remains in place.  No pneumothorax.  Left-sided chest tube and
mediastinal drain also removed.  Evidence of prior CABG noted with
stable enlargement cardiomediastinal silhouette.  Moderate
bilateral pleural effusions and left greater than right bibasilar
atelectasis noted.
IMPRESSION: No pneumothorax after removal of right IJ line and left chest
tube/mediastinal drain.

Stable findings otherwise.

## 2012-01-10 ENCOUNTER — Ambulatory Visit (INDEPENDENT_AMBULATORY_CARE_PROVIDER_SITE_OTHER): Payer: Managed Care, Other (non HMO) | Admitting: Internal Medicine

## 2012-01-10 DIAGNOSIS — R0602 Shortness of breath: Secondary | ICD-10-CM

## 2012-01-10 LAB — PULMONARY FUNCTION TEST

## 2012-01-10 IMAGING — CR DG CHEST 2V
2 series · 2 of 2 positions shown · non-contrast
Comparison: 02/09/2009 and earlier.

CLINICAL DATA: 74-year-old male status post CABG.  Chest soreness.

CHEST - 2 VIEW

[w chest pa]
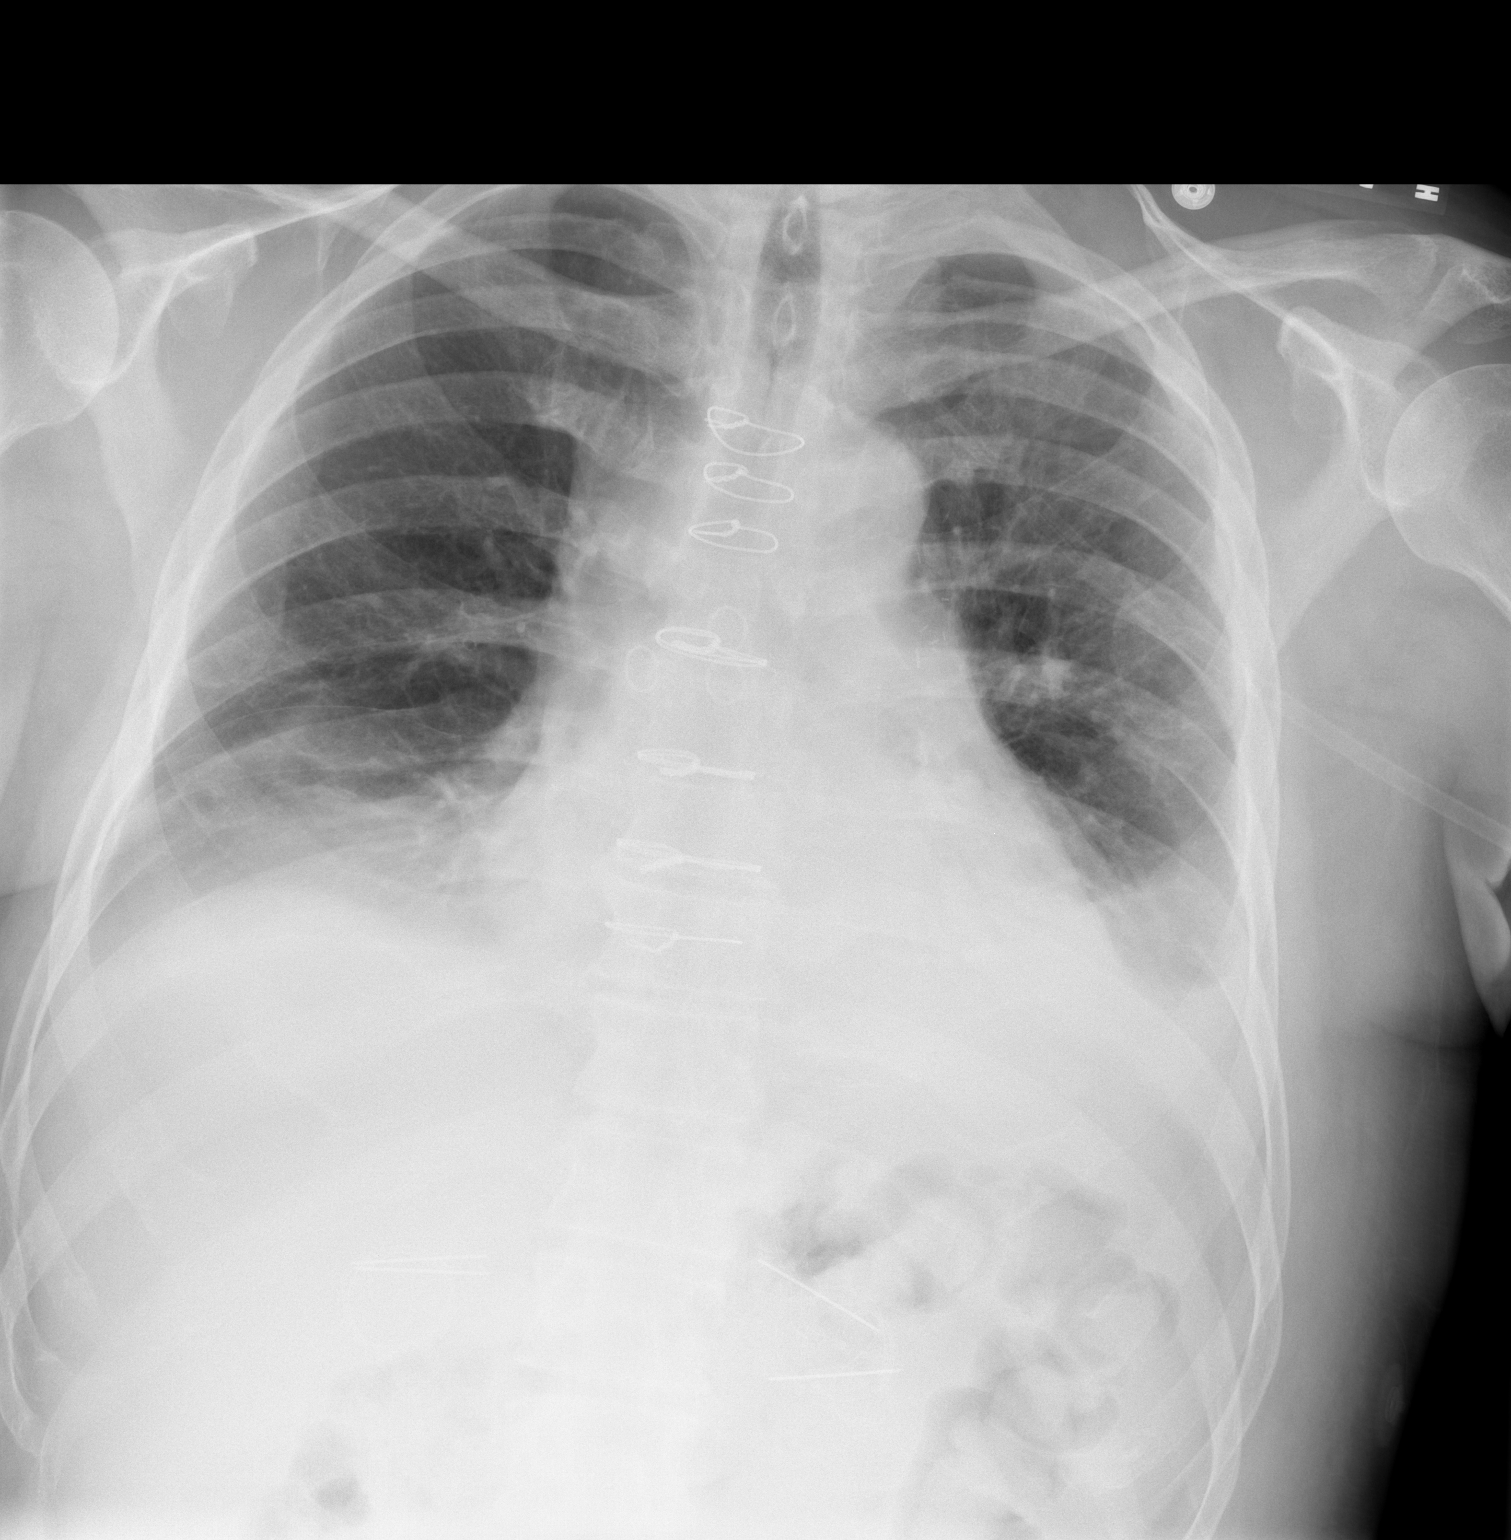

[w chest lat]
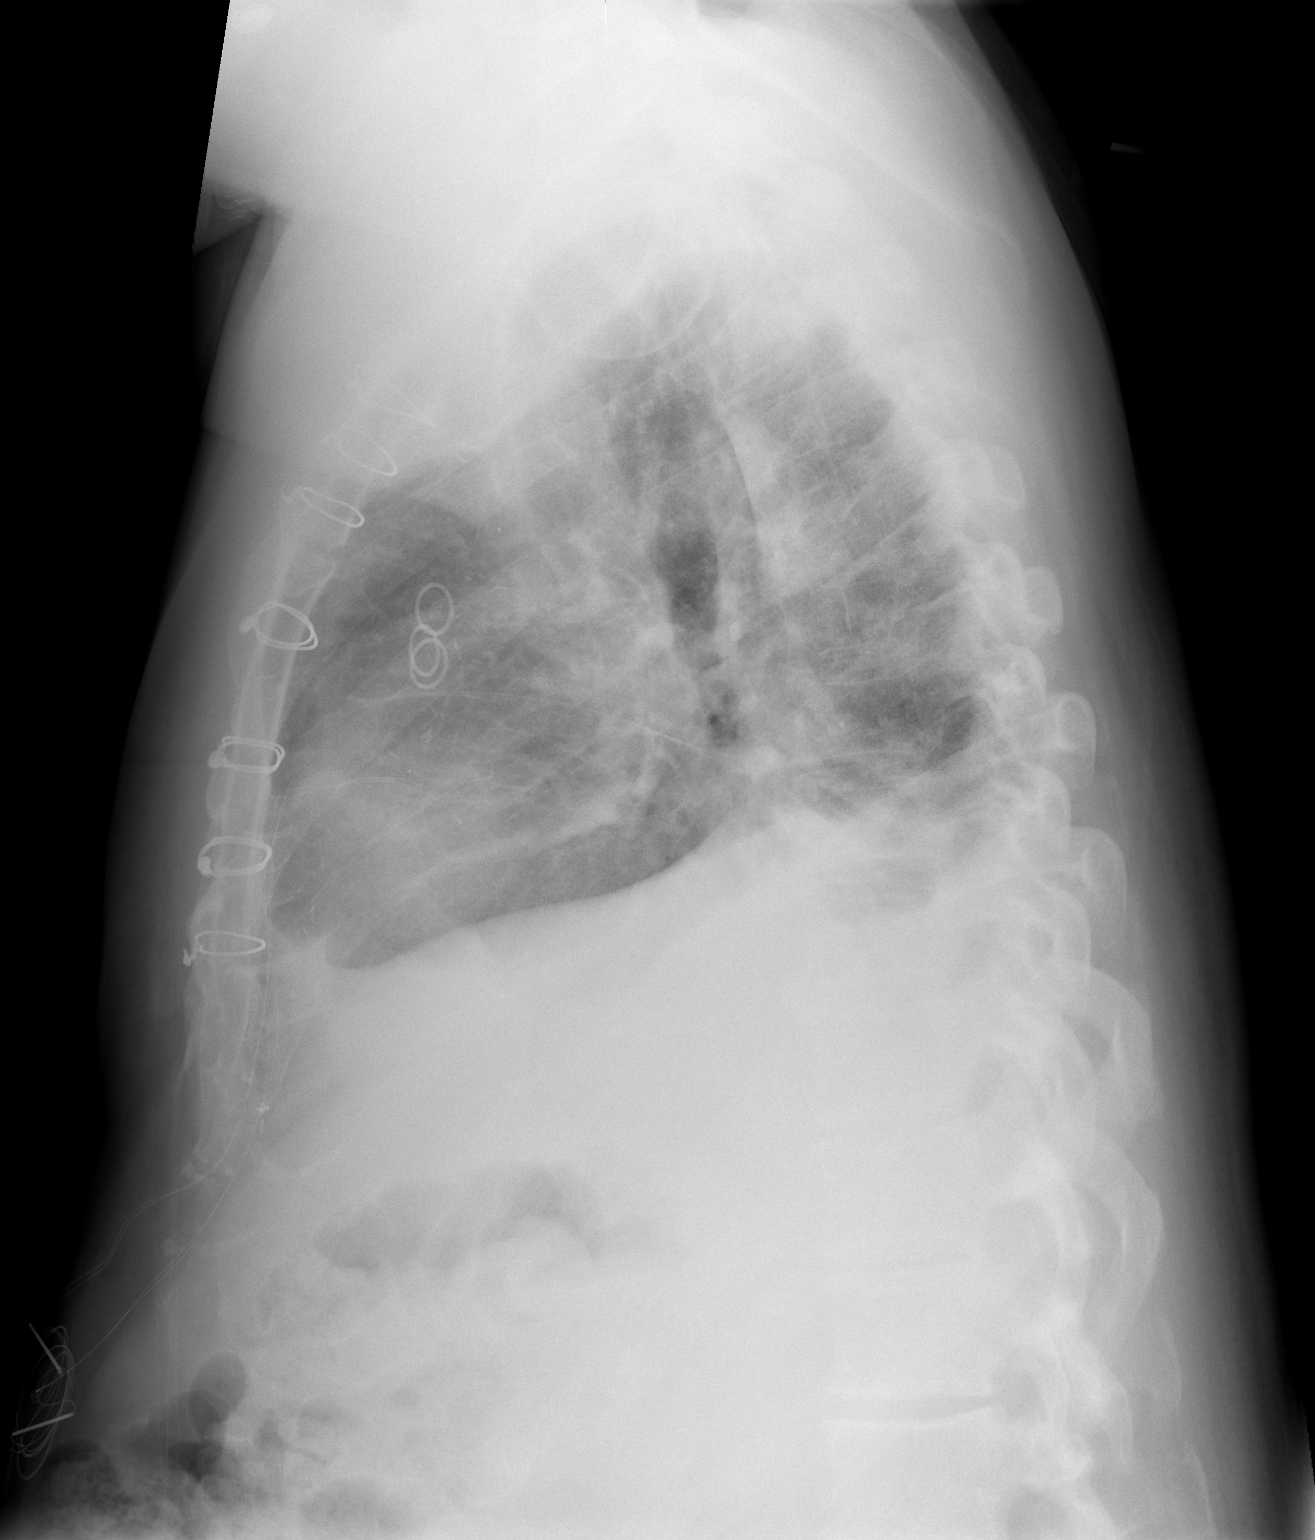

[2 of 2 positions shown; findings below may reference images not displayed]

FINDINGS: PA and lateral views of the chest.  Stable cardiac size
and mediastinal contours.  Stable sequelae of CABG.  No
pneumothorax or pulmonary edema.  Small to moderate bilateral
pleural effusions with bibasilar atelectasis.  Right IJ introducer
sheath .  Epicardial pacer wires remain in place. Stable visualized
osseous structures.
IMPRESSION: 1.  Small moderate bilateral pleural effusions with bibasilar
atelectasis.
2.  Right IJ catheter/sheath removed.

## 2012-01-10 NOTE — Progress Notes (Signed)
PFT done today. 

## 2012-01-14 ENCOUNTER — Encounter (HOSPITAL_BASED_OUTPATIENT_CLINIC_OR_DEPARTMENT_OTHER): Payer: Worker's Compensation

## 2012-01-15 ENCOUNTER — Telehealth: Payer: Self-pay | Admitting: *Deleted

## 2012-01-15 NOTE — Telephone Encounter (Signed)
Pt.notified

## 2012-01-15 NOTE — Telephone Encounter (Signed)
Dr Shirlee Latch reviewed PFT done 01/10/12. Mild COPD. LMTCB

## 2012-01-24 ENCOUNTER — Encounter: Payer: Self-pay | Admitting: Cardiology

## 2012-01-24 ENCOUNTER — Telehealth: Payer: Self-pay | Admitting: Cardiology

## 2012-01-24 NOTE — Telephone Encounter (Signed)
chandra handling pt's workmen's comp case, pt had a leg injury at work, and ended up in the hospital with a  Heart attack, pt tells workmen's comp that his heart attack was due to leg wound, and that dr Shirlee Latch confirmed this at appt in December and that he wrote a letter, there is no copy of such a letter so chandra calling to see if this is indeed the case and if so can she get a letter stating this?

## 2012-01-25 ENCOUNTER — Encounter: Payer: Self-pay | Admitting: Pulmonary Disease

## 2012-01-25 ENCOUNTER — Ambulatory Visit (INDEPENDENT_AMBULATORY_CARE_PROVIDER_SITE_OTHER): Payer: Medicare Other | Admitting: Pulmonary Disease

## 2012-01-25 ENCOUNTER — Encounter: Payer: Self-pay | Admitting: Cardiology

## 2012-01-25 VITALS — BP 102/60 | HR 74 | Temp 97.3°F | Ht 72.0 in | Wt 208.2 lb

## 2012-01-25 DIAGNOSIS — G4733 Obstructive sleep apnea (adult) (pediatric): Secondary | ICD-10-CM

## 2012-01-25 NOTE — Progress Notes (Signed)
  Subjective:    Patient ID: Joshua Vincent, male    DOB: 1934-05-19, 78 y.o.   MRN: [id number]  HPI No visit.  Pt has not starting wearing cpap yet.    Review of Systems  Constitutional: Negative for fever and unexpected weight change.  HENT: Negative for ear pain, nosebleeds, congestion, sore throat, rhinorrhea, sneezing, trouble swallowing, dental problem, postnasal drip and sinus pressure.   Eyes: Negative for redness and itching.       Dry eyes---eyes are constantly watering  Respiratory: Positive for shortness of breath ( with exertion). Negative for cough, chest tightness and wheezing.   Cardiovascular: Positive for chest pain ( "tinge of pain" this morning x 1 day. Patient states that the pain in over his heart/in his heart). Negative for palpitations and leg swelling.  Gastrointestinal: Negative for nausea and vomiting.  Genitourinary: Negative for dysuria.  Musculoskeletal: Negative for joint swelling.  Skin: Negative for rash.  Neurological: Negative for headaches.  Hematological: Does not bruise/bleed easily.  Psychiatric/Behavioral: Negative for dysphoric mood. The patient is not nervous/anxious.        Objective:   Physical Exam        Assessment & Plan:

## 2012-01-25 NOTE — Assessment & Plan Note (Signed)
Pt has been out of town and has not started wearing cpap yet.  Asked him to start, and will see him in 6 weeks.

## 2012-01-25 NOTE — Telephone Encounter (Signed)
She is aware that any info she needs will need to be requested in writing with a release from the patient.

## 2012-01-25 NOTE — Patient Instructions (Addendum)
Start using cpap machine, and let me know if you are having tolerance issues. followup with me in 6 weeks.

## 2012-01-25 NOTE — Telephone Encounter (Signed)
Spoke with Karren Burly.

## 2012-02-18 ENCOUNTER — Telehealth: Payer: Self-pay | Admitting: Cardiology

## 2012-02-18 NOTE — Telephone Encounter (Signed)
Spoke with pt, he is uncertain of why anne called. He has had no testing that he does not know the results. Will leave message for anne to call if needed. Pt agreed with this plan.

## 2012-02-18 NOTE — Telephone Encounter (Signed)
New Problem:     Patient called in returning a call form Anne form 02/15/12.  Please call back.

## 2012-02-20 NOTE — Telephone Encounter (Signed)
I did not call patient on 02/15/12-I was off.

## 2012-03-07 ENCOUNTER — Encounter: Payer: Self-pay | Admitting: Pulmonary Disease

## 2012-03-07 ENCOUNTER — Ambulatory Visit (INDEPENDENT_AMBULATORY_CARE_PROVIDER_SITE_OTHER): Payer: Medicare Other | Admitting: Pulmonary Disease

## 2012-03-07 VITALS — BP 118/68 | HR 73 | Temp 98.2°F | Ht 72.0 in | Wt 204.0 lb

## 2012-03-07 DIAGNOSIS — G4733 Obstructive sleep apnea (adult) (pediatric): Secondary | ICD-10-CM

## 2012-03-07 NOTE — Progress Notes (Signed)
  Subjective:    Patient ID: Joshua Vincent, male    DOB: [birth year], 78 y.o.   MRN: [id number]  HPI Patient comes in today for followup of his objective sleep apnea.  He is wearing CPAP compliantly, and denies any issues with his pressure.  He feels that he may be sleeping better, but is definitely seen improvement in his daytime alertness.  His only complaint is difficulty with mask fitting, depending upon if his teeth are in or out.  He has used a nasal mask which fits well, and feels that he can keep his mouth closed.   Review of Systems  Constitutional: Negative for fever and unexpected weight change.  HENT: Positive for congestion and rhinorrhea. Negative for ear pain, nosebleeds, sore throat, sneezing, trouble swallowing, dental problem, postnasal drip and sinus pressure.   Eyes: Negative for redness and itching.  Respiratory: Positive for cough and shortness of breath. Negative for chest tightness and wheezing.   Cardiovascular: Positive for leg swelling. Negative for palpitations.  Gastrointestinal: Negative for nausea and vomiting.  Genitourinary: Negative for dysuria.  Musculoskeletal: Negative for joint swelling.  Skin: Negative for rash.  Neurological: Positive for headaches.  Hematological: Does not bruise/bleed easily.  Psychiatric/Behavioral: Negative for dysphoric mood. The patient is not nervous/anxious.        Objective:   Physical Exam Overweight male in no acute distress Nose without purulence or discharge noted No skin breakdown or pressure necrosis from the CPAP mask Neck without lymphadenopathy or thyromegaly Lungs extremities without edema, no cyanosis Alert and oriented, does not appear to be sleepy, moves all 4 extremities.       Assessment & Plan:

## 2012-03-07 NOTE — Assessment & Plan Note (Signed)
The patient is sleeping better with improved daytime alertness since being on CPAP.  He is having no issues with pressure, but has been having issues with his fullface mask.  I have asked him to go back to his nasal mask if he thinks he is able to keep his mouth closed during sleep.  We also need to optimize his pressure.  Finally, I have encouraged him to work on weight loss. Care Plan:  At this point, will arrange for the patient's machine to be changed over to auto mode for 2 weeks to optimize their pressure.  I will review the downloaded data once sent by dme, and also evaluate for compliance, leaks, and residual osa.  I will call the patient and dme to discuss the results, and have the patient's machine set appropriately.  This will serve as the pt's cpap pressure titration.

## 2012-03-07 NOTE — Patient Instructions (Addendum)
Try using your nasal mask with your teeth in or out.  Make sure you are keeping your mouth closed. Will optimize your pressure on the auto setting for the next 2 weeks.  Will call you with results. Work on weight reduction.  followup with me 6mos, but call if having tolerance issues.

## 2012-03-21 ENCOUNTER — Ambulatory Visit (INDEPENDENT_AMBULATORY_CARE_PROVIDER_SITE_OTHER): Payer: Medicare Other | Admitting: Cardiology

## 2012-03-21 ENCOUNTER — Encounter: Payer: Self-pay | Admitting: Cardiology

## 2012-03-21 VITALS — BP 162/82 | HR 67 | Ht 72.0 in | Wt 209.0 lb

## 2012-03-21 DIAGNOSIS — I2510 Atherosclerotic heart disease of native coronary artery without angina pectoris: Secondary | ICD-10-CM

## 2012-03-21 DIAGNOSIS — I5032 Chronic diastolic (congestive) heart failure: Secondary | ICD-10-CM

## 2012-03-21 DIAGNOSIS — E785 Hyperlipidemia, unspecified: Secondary | ICD-10-CM

## 2012-03-21 DIAGNOSIS — I5043 Acute on chronic combined systolic (congestive) and diastolic (congestive) heart failure: Secondary | ICD-10-CM

## 2012-03-21 DIAGNOSIS — I10 Essential (primary) hypertension: Secondary | ICD-10-CM

## 2012-03-21 MED ORDER — QUINAPRIL HCL 20 MG PO TABS: 20.0000 mg | ORAL_TABLET | Freq: Every day | ORAL | Status: DC

## 2012-03-21 NOTE — Patient Instructions (Addendum)
You have been referred to see the pharmacist to review your medications with you.    Your physician recommends that you schedule a follow-up appointment in: 3 months with Dr Shirlee Latch.   Your physician recommends that you return for fasting lab in 3 months when you see Dr Shirlee Latch.--BMET/Lipid profile/Liver profile.  Start taking all your medications regularly. After you are back on all your medications regularly take and record your blood pressure every other day. I will call you in about 3 weeks and get the readings. Katina Dung, RN  Check with the pharmacy about your medications. I will call you this afternoon and go over your medicines with you. Luana Shu. I reviewed medications with patient over the telephone today at 5:45PM. The changes to the medication list that reflect medications that pt is currently taking  are coreg 12.5mg  bid instead of coreg 25mg  bid; furosemide 40mg  daily instead of bid; pt was not taking quinapril at all so Dr Shirlee Latch has prescribed quinapril 20mg  daily.

## 2012-03-23 NOTE — Progress Notes (Signed)
Patient ID: Joshua Vincent, male   DOB: 1934-03-03, 78 y.o.   MRN: [id number] PCP: Dr. Clovis Riley  78 yo with history of diastolic CHF, CAD s/p NSTEMI in 12/10 accompanied by pulmonary edema and peri-catheterization CVA, now s/p CABG presents for followup.  He was admitted in 8/13 with NSTEMI. LHC showed severe disease in the RCA and occlusion of the LAD and LCX.  SVG-D and SVG-RCA were occluded.  SVG-OM and LIMA-LAD were patent.  EF was 45% by LV-gram but echo done in 9/13 showed EF 55-60% . He was managed medically.  He was readmitted in 9/13 with hypertensive emergency and acute on chronic diastolic CHF.  He had not been taking his medications due to financial strains.  He was admitted and diuresed and discharged on an adjusted regimen to try to control medication costs.  He is now using CPAP.   Since last appointment, symptoms have been stable.  He is short of breath after walking about 100 yards or going up a flight of steps.  No chest pain.  Weight is up 6 lbs since last appointment.  BP is running high today.  He has been under stress at home (grandson and his wife are living with him.  He is not sure if he is taking all his medications.  He did not bring his bottles with him.   LDL in 12/13 was elevated but he had not been taking atorvastatin.   ECG: NSR, LAFB, RBBB  Labs (5/11): BNP 520, K 4.1, creatinine 1.0 Labs (8/11): BNP 120, HDL 34, LDL 148 Labs (9/11): K 4.7, creatinine 1.2 Labs (12/11): K 3.3 => 5.3, creatinine 0.9 => 1.3, BNP 918 => 199 Labs (2/12): K 3.9, creatinine 1.11 Labs (6/12): K 4.3, creatinine 1.0 Labs (8/13): LDL 142, HDL 40 (not taking statin) Labs (9/13): K 3.8, creatinine 1.46 => 1.6, BNP 385 Labs (12/13): K 4.1, creatinine 1.4, BNP 247, LDL 111, HDL 36  Allergies (verified):  1)  ! Niacin  Past Medical History: 1. HYPERTENSION (ICD-401.9) 2. DYSLIPIDEMIA (ICD-272.4) 3. BENIGN PROSTATIC HYPERTROPHY, HX OF (ICD-V13.8) 4. DIABETIC PERIPHERAL NEUROPATHY  (ICD-250.60) 5. DM (ICD-250.00) 6. OSTEOARTHRITIS, SHOULDER, RIGHT (ICD-715.91) 7. COPD: PFTs (1/14) with mild obstruction.  8. CAD: NSTEMI (12/10).  LHC showed sequential 90% mid LAD stenoses and 90% stenosis at the mid to distal LAD, D1 totally occluded, OM1 subtotally occluded, 70% mid CFX, EF 50%.  CABG (1/11): LIMA-LAD, SVG-D, SVG-OM, SVG-PLV.  NSTEMI 8/13 with LHC showing total occlusion of the LAD and LCX, 80-90% mPDA, 90% PLV, occluded SVG-RCA, occluded SVG-D, patent SVG-OM and patent LIMA-LAD.  He was managed medically.  9.  CVA: peri-catheterization in 12/10.  Patient initially developed double vision and MRI showed right dorsal mid-brain infarct.  Carotid dopplers with no significant disease.  Double vision has resolved.  10.  Diastolic CHF: Pulmonary edema with NSTEMI.  Echo (12/10) showed EF 50-55% with mid to apical septal HK and mild mitral regurgitation.  Echo (3/11) when admitted with CHF exacerbation: Moderate LVH, EF 60%, valves ok.  LV-gram with EF 45% in 8/13.  Echo (9/13) with EF 55-60%, mild MR.  11. Bladder cancer s/p resection 7/11 12. 3 week event monitor (8/11): no significant arrhythmias, occasional PVCs 13. CKD 14. Nocturnal hypoxemia 15. OSA: on CPAP.    Family History: Father died at age 70 from an embolic cerebrovascular accident from a deep vein thrombosis in his lower extremity.  His  brother died in his 36s from similar circumstances of an embolic  CVA from a lower extremity deep vein thrombosis.  Mother died at age 1 without health problems prior to that.  The patient has a sister who died in her 4s from sequelae of a war injury.  He has a sister who is alive and is 52 years old with osteoarthritis.  He has 3 sons who are alive and healthy.   Social History: The patient is married.  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  He has had a distant smoking history, 50-pack years and quit 20 years ago.  He drinks alcohol  occasionally.  He does not use drugs.   ROS:  All systems reviewed and negative except as per HPI.   Current Outpatient Prescriptions  Medication Sig Dispense Refill  . amLODipine (NORVASC) 10 MG tablet Take 10 mg by mouth daily.      . Ascorbic Acid (VITAMIN C PO) Take 500 mg by mouth daily.      Marland Kitchen aspirin EC 81 MG tablet Take 81 mg by mouth daily.      Marland Kitchen atorvastatin (LIPITOR) 80 MG tablet Take 1 tablet (80 mg total) by mouth at bedtime.  30 tablet  6  . carvedilol (COREG) 12.5 MG tablet Take 1 tablet (12.5 mg total) by mouth 2 (two) times daily.      . Cholecalciferol (VITAMIN D PO) Take 1 tablet by mouth daily.      Marland Kitchen doxazosin (CARDURA) 8 MG tablet Take 1 tablet (8 mg total) by mouth at bedtime.  90 tablet  3  . furosemide (LASIX) 40 MG tablet Take 1 tablet (40 mg total) by mouth daily.      Marland Kitchen glipiZIDE (GLUCOTROL) 5 MG tablet Take 1 tablet (5 mg total) by mouth 2 (two) times daily before a meal.  60 tablet  11  . Insulin Glargine (LANTUS SOLOSTAR [place]) Inject 35 Units into the skin daily.       . metFORMIN (GLUCOPHAGE) 1000 MG tablet Take 1 tablet (1,000 mg total) by mouth 2 (two) times daily with a meal.  60 tablet  11  . nabumetone (RELAFEN) 500 MG tablet Take 500 mg by mouth daily.      . naproxen (NAPROSYN) 375 MG tablet Take 375 mg by mouth daily as needed.      . nitroGLYCERIN (NITROSTAT) 0.4 MG SL tablet Place 1 tablet (0.4 mg total) under the tongue every 5 (five) minutes as needed. For chest pain  25 tablet  3  . potassium chloride SA (K-DUR,KLOR-CON) 20 MEQ tablet Take 1 tablet (20 mEq total) by mouth 2 (two) times daily.  180 tablet  3  . quinapril (ACCUPRIL) 20 MG tablet Take 1 tablet (20 mg total) by mouth at bedtime.  30 tablet  6  . VITAMIN A PO Take 1 tablet by mouth daily.      . vitamin B-12 (CYANOCOBALAMIN) 50 MCG tablet Take 50 mcg by mouth daily.      Marland Kitchen VITAMIN E PO Take 1 tablet by mouth daily.       No current facility-administered medications for this visit.     BP 162/82  Pulse 67  Ht 6' (1.829 m)  Wt 209 lb (94.802 kg)  BMI 28.34 kg/m2 General:  Well developed, well nourished, in no acute distress. Neck:  Neck supple, JVP 7 cm. No masses, thyromegaly or abnormal cervical nodes. Lungs:  Clear bilaterally to auscultation and percussion. Heart:  Non-displaced PMI, chest non-tender; regular rate  and rhythm, S1, S2 without murmurs, rubs or gallops. Carotid upstroke normal, no bruit.  1+ edema 1/4 up on left. Abdomen:  Bowel sounds positive; abdomen soft and non-tender without masses, organomegaly, or hernias noted. No hepatosplenomegaly. Extremities:  No clubbing or cyanosis. Neurologic:  Alert and oriented x 3. Psych:  Normal affect.  Assessment/Plan: 1. CAD: Status post CABG.  Cath 8/13 showed 2/4 patent grafts.  He was managed medically. No further chest pain.  Continue ASA 81, atorvastatin, Coreg, ACEI.   2. Chronic diastolic CHF: EF normal on most recent echo.  NYHA class II symptoms.  Volume looks stable.  He does not appear significantly overloaded though his weight is up.  He has been taking his Lasix 40 mg bid and will continue this dosing.   3. CKD: Creatinine has been stable recently. 4. HTN: BP high.  He is not sure if he is taking all his meds.  He will go home and check his pill bottles.  He will call my nurse to go over the actual meds that he is taking.  He will check his BP daily once we get him on his correct medicines and we will call him in 2 wks to see what his BP is runnign.  5. Hyperlipidemia: Continue atorvastatin.  6. OSA: Using CPAP.  7. COPD: Mild obstructive defect on PFTs.  Prior smoker.  8. Medication compliance is an issue.  As above, we will call to go over his meds at home.  I will also have him follow with our pharmacy service to organize a pill box.   Marca Ancona 03/23/2012

## 2012-03-26 ENCOUNTER — Ambulatory Visit (INDEPENDENT_AMBULATORY_CARE_PROVIDER_SITE_OTHER): Payer: Medicare Other | Admitting: Pharmacist

## 2012-03-26 DIAGNOSIS — Z79899 Other long term (current) drug therapy: Secondary | ICD-10-CM

## 2012-03-26 MED ORDER — LISINOPRIL 20 MG PO TABS: 20.0000 mg | ORAL_TABLET | Freq: Every day | ORAL | Status: DC

## 2012-03-26 NOTE — Patient Instructions (Addendum)
1.  GO to [facility] and pick up Lisinopril 20mg  daily 2.  Start getting most of your medicine at [facility] on the $4 list 3.  Get your Sugar medicines at [facility] - they are free 4.  Ok to stop Vitamins like Vitamin C, Vitamin E and Co-Q 10 5.  Every morning get up, go to the bathroom and step on the scale - IF your weight is more than 3lbs from the day before call Dr. [name] office 6.  Important to take your medicine as prescribed 7.  Check you sugar at least 1 time a week
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
## 2012-04-09 ENCOUNTER — Telehealth: Payer: Self-pay | Admitting: Cardiology

## 2012-04-09 NOTE — Telephone Encounter (Signed)
New Problem: ° ° ° °Patient called in returning a call.  Please call back. °

## 2012-04-09 NOTE — Telephone Encounter (Signed)
I did not call patient--I spoke with pt and he is aware I did not call him.

## 2012-05-01 ENCOUNTER — Telehealth: Payer: Self-pay | Admitting: *Deleted

## 2012-05-01 NOTE — Telephone Encounter (Signed)
HTN: BP high. He is not sure if he is taking all his meds. He will go home and check his pill bottles. He will call my nurse to go over the actual meds that he is taking. He will check his BP daily once we get him on his correct medicines and we will call him in 2 wks to see what his BP is runnign.  05/01/12 LMTCB for pt.

## 2012-05-04 ENCOUNTER — Other Ambulatory Visit: Payer: Self-pay | Admitting: Pulmonary Disease

## 2012-05-04 DIAGNOSIS — G4733 Obstructive sleep apnea (adult) (pediatric): Secondary | ICD-10-CM

## 2012-05-05 NOTE — Telephone Encounter (Signed)
I spoke with patient 05/01/12. Pt states he has not been checking his BP. He does not have a way to check his BP at home. His granddaughter has had his car and he has not been able to go to a pharmacy to check BP. Pt has his car back now. He will try to go to pharmacy a couple of times a week and check his BP and record it.  He states he will call me in 2-3 weeks with the readings.

## 2012-06-30 ENCOUNTER — Emergency Department (HOSPITAL_COMMUNITY): Payer: Medicare Other

## 2012-06-30 ENCOUNTER — Inpatient Hospital Stay (HOSPITAL_COMMUNITY)
Admission: EM | Admit: 2012-06-30 | Discharge: 2012-07-09 | DRG: 280 | Disposition: A | Discharge status: Home / Self Care | Payer: Medicare Other | Attending: Internal Medicine | Admitting: Internal Medicine

## 2012-06-30 ENCOUNTER — Other Ambulatory Visit: Payer: Self-pay

## 2012-06-30 ENCOUNTER — Encounter (HOSPITAL_COMMUNITY): Payer: Self-pay | Admitting: Emergency Medicine

## 2012-06-30 ENCOUNTER — Ambulatory Visit: Payer: Self-pay | Admitting: Cardiology

## 2012-06-30 DIAGNOSIS — M1990 Unspecified osteoarthritis, unspecified site: Secondary | ICD-10-CM | POA: Diagnosis present

## 2012-06-30 DIAGNOSIS — I509 Heart failure, unspecified: Secondary | ICD-10-CM

## 2012-06-30 DIAGNOSIS — I214 Non-ST elevation (NSTEMI) myocardial infarction: Secondary | ICD-10-CM | POA: Diagnosis present

## 2012-06-30 DIAGNOSIS — Z951 Presence of aortocoronary bypass graft: Secondary | ICD-10-CM

## 2012-06-30 DIAGNOSIS — E876 Hypokalemia: Secondary | ICD-10-CM | POA: Diagnosis present

## 2012-06-30 DIAGNOSIS — E1165 Type 2 diabetes mellitus with hyperglycemia: Secondary | ICD-10-CM | POA: Diagnosis present

## 2012-06-30 DIAGNOSIS — N179 Acute kidney failure, unspecified: Secondary | ICD-10-CM | POA: Diagnosis present

## 2012-06-30 DIAGNOSIS — I2510 Atherosclerotic heart disease of native coronary artery without angina pectoris: Secondary | ICD-10-CM | POA: Diagnosis present

## 2012-06-30 DIAGNOSIS — M19019 Primary osteoarthritis, unspecified shoulder: Secondary | ICD-10-CM | POA: Diagnosis present

## 2012-06-30 DIAGNOSIS — I5043 Acute on chronic combined systolic (congestive) and diastolic (congestive) heart failure: Secondary | ICD-10-CM

## 2012-06-30 DIAGNOSIS — Z8673 Personal history of transient ischemic attack (TIA), and cerebral infarction without residual deficits: Secondary | ICD-10-CM

## 2012-06-30 DIAGNOSIS — E119 Type 2 diabetes mellitus without complications: Secondary | ICD-10-CM

## 2012-06-30 DIAGNOSIS — R339 Retention of urine, unspecified: Secondary | ICD-10-CM | POA: Diagnosis present

## 2012-06-30 DIAGNOSIS — Z87891 Personal history of nicotine dependence: Secondary | ICD-10-CM

## 2012-06-30 DIAGNOSIS — G4733 Obstructive sleep apnea (adult) (pediatric): Secondary | ICD-10-CM | POA: Diagnosis present

## 2012-06-30 DIAGNOSIS — E785 Hyperlipidemia, unspecified: Secondary | ICD-10-CM | POA: Diagnosis present

## 2012-06-30 DIAGNOSIS — IMO0002 Reserved for concepts with insufficient information to code with codable children: Secondary | ICD-10-CM | POA: Diagnosis present

## 2012-06-30 DIAGNOSIS — Z8551 Personal history of malignant neoplasm of bladder: Secondary | ICD-10-CM

## 2012-06-30 DIAGNOSIS — N289 Disorder of kidney and ureter, unspecified: Secondary | ICD-10-CM

## 2012-06-30 DIAGNOSIS — J189 Pneumonia, unspecified organism: Secondary | ICD-10-CM | POA: Diagnosis present

## 2012-06-30 DIAGNOSIS — IMO0001 Reserved for inherently not codable concepts without codable children: Secondary | ICD-10-CM | POA: Diagnosis present

## 2012-06-30 DIAGNOSIS — R7989 Other specified abnormal findings of blood chemistry: Secondary | ICD-10-CM

## 2012-06-30 DIAGNOSIS — I252 Old myocardial infarction: Secondary | ICD-10-CM

## 2012-06-30 DIAGNOSIS — E871 Hypo-osmolality and hyponatremia: Secondary | ICD-10-CM | POA: Diagnosis present

## 2012-06-30 DIAGNOSIS — J9600 Acute respiratory failure, unspecified whether with hypoxia or hypercapnia: Secondary | ICD-10-CM | POA: Diagnosis not present

## 2012-06-30 DIAGNOSIS — D509 Iron deficiency anemia, unspecified: Secondary | ICD-10-CM | POA: Diagnosis present

## 2012-06-30 DIAGNOSIS — Z794 Long term (current) use of insulin: Secondary | ICD-10-CM

## 2012-06-30 DIAGNOSIS — I10 Essential (primary) hypertension: Secondary | ICD-10-CM | POA: Diagnosis present

## 2012-06-30 DIAGNOSIS — I5033 Acute on chronic diastolic (congestive) heart failure: Principal | ICD-10-CM | POA: Diagnosis present

## 2012-06-30 LAB — TROPONIN I
Troponin I: 7.7 ng/mL (ref <0.30)
Troponin I: 12.11 ng/mL (ref <0.30)

## 2012-06-30 LAB — COMPREHENSIVE METABOLIC PANEL WITH GFR
ALT: 15 U/L (ref 0–53)
AST: 29 U/L (ref 0–37)
Albumin: 2.1 g/dL — ABNORMAL LOW (ref 3.5–5.2)
Alkaline Phosphatase: 103 U/L (ref 39–117)
BUN: 45 mg/dL — ABNORMAL HIGH (ref 6–23)
Chloride: 93 meq/L — ABNORMAL LOW (ref 96–112)
Potassium: 2.8 meq/L — ABNORMAL LOW (ref 3.5–5.1)
Sodium: 132 meq/L — ABNORMAL LOW (ref 135–145)
Total Bilirubin: 0.8 mg/dL (ref 0.3–1.2)
Total Protein: 6.4 g/dL (ref 6.0–8.3)

## 2012-06-30 LAB — CG4 I-STAT (LACTIC ACID): Lactic Acid, Venous: 2.18 mmol/L (ref 0.5–2.2)

## 2012-06-30 LAB — BASIC METABOLIC PANEL
CO2: 25 mEq/L (ref 19–32)
Calcium: 8.4 mg/dL (ref 8.4–10.5)
Chloride: 93 mEq/L — ABNORMAL LOW (ref 96–112)
Creatinine, Ser: 2.59 mg/dL — ABNORMAL HIGH (ref 0.50–1.35)
Glucose, Bld: 414 mg/dL — ABNORMAL HIGH (ref 70–99)

## 2012-06-30 LAB — GLUCOSE, CAPILLARY
Glucose-Capillary: 388 mg/dL — ABNORMAL HIGH (ref 70–99)
Glucose-Capillary: 322 mg/dL — ABNORMAL HIGH (ref 70–99)

## 2012-06-30 LAB — POCT I-STAT 3, ART BLOOD GAS (G3+)
Bicarbonate: 24.5 mEq/L — ABNORMAL HIGH (ref 20.0–24.0)
O2 Saturation: 95 %
TCO2: 26 mmol/L (ref 0–100)
pCO2 arterial: 37.3 mmHg (ref 35.0–45.0)
pH, Arterial: 7.426 (ref 7.350–7.450)
pO2, Arterial: 75 mmHg — ABNORMAL LOW (ref 80.0–100.0)

## 2012-06-30 LAB — CBC WITH DIFFERENTIAL/PLATELET
Basophils Absolute: 0 K/uL (ref 0.0–0.1)
Basophils Relative: 0 % (ref 0–1)
Eosinophils Absolute: 0 K/uL (ref 0.0–0.7)
Eosinophils Relative: 0 % (ref 0–5)
HCT: 36.9 % — ABNORMAL LOW (ref 39.0–52.0)
Hemoglobin: 13.4 g/dL (ref 13.0–17.0)
Lymphocytes Relative: 9 % — ABNORMAL LOW (ref 12–46)
Lymphs Abs: 1.1 10*3/uL (ref 0.7–4.0)
MCH: 28.8 pg (ref 26.0–34.0)
MCHC: 36.3 g/dL — ABNORMAL HIGH (ref 30.0–36.0)
MCV: 79.2 fL (ref 78.0–100.0)
Monocytes Absolute: 1 10*3/uL (ref 0.1–1.0)
Monocytes Relative: 9 % (ref 3–12)
Neutro Abs: 9.8 K/uL — ABNORMAL HIGH (ref 1.7–7.7)
Neutrophils Relative %: 82 % — ABNORMAL HIGH (ref 43–77)
Platelets: 211 K/uL (ref 150–400)
RBC: 4.66 MIL/uL (ref 4.22–5.81)
RDW: 13.4 % (ref 11.5–15.5)
WBC: 12 10*3/uL — ABNORMAL HIGH (ref 4.0–10.5)

## 2012-06-30 LAB — COMPREHENSIVE METABOLIC PANEL
CO2: 25 mEq/L (ref 19–32)
Calcium: 8.7 mg/dL (ref 8.4–10.5)
Creatinine, Ser: 2.74 mg/dL — ABNORMAL HIGH (ref 0.50–1.35)
GFR calc Af Amer: 25 mL/min — ABNORMAL LOW (ref >90)
GFR calc non Af Amer: 21 mL/min — ABNORMAL LOW (ref >90)
Glucose, Bld: 392 mg/dL — ABNORMAL HIGH (ref 70–99)

## 2012-06-30 LAB — MRSA PCR SCREENING: MRSA by PCR: NEGATIVE

## 2012-06-30 LAB — PRO B NATRIURETIC PEPTIDE: Pro B Natriuretic peptide (BNP): 52044 pg/mL — ABNORMAL HIGH (ref 0–450)

## 2012-06-30 MED ORDER — INSULIN GLARGINE 100 UNIT/ML ~~LOC~~ SOLN
35.0000 [IU] | Freq: Every day | SUBCUTANEOUS | Status: DC
  Administered 2012-06-30 – 2012-07-01 (×2): 35 [IU] via SUBCUTANEOUS
  Filled 2012-06-30 (×3): qty 0.35

## 2012-06-30 MED ORDER — IPRATROPIUM BROMIDE 0.02 % IN SOLN
0.5000 mg | Freq: Two times a day (BID) | RESPIRATORY_TRACT | Status: DC | PRN
  Administered 2012-07-01 – 2012-07-03 (×2): 0.5 mg via RESPIRATORY_TRACT
  Filled 2012-06-30 (×2): qty 2.5

## 2012-06-30 MED ORDER — CARVEDILOL 12.5 MG PO TABS
12.5000 mg | ORAL_TABLET | Freq: Two times a day (BID) | ORAL | Status: DC
  Administered 2012-06-30 – 2012-07-03 (×7): 12.5 mg via ORAL
  Filled 2012-06-30 (×11): qty 1

## 2012-06-30 MED ORDER — DEXTROSE 5 % IV SOLN
1.0000 g | Freq: Once | INTRAVENOUS | Status: AC
  Administered 2012-06-30: 1 g via INTRAVENOUS
  Filled 2012-06-30: qty 10

## 2012-06-30 MED ORDER — INSULIN GLARGINE 100 UNIT/ML SOLOSTAR PEN
35.0000 [IU] | PEN_INJECTOR | Freq: Every day | SUBCUTANEOUS | Status: DC
Start: 2012-06-30 — End: 2012-06-30

## 2012-06-30 MED ORDER — INSULIN ASPART 100 UNIT/ML ~~LOC~~ SOLN
0.0000 [IU] | Freq: Three times a day (TID) | SUBCUTANEOUS | Status: DC
Start: 2012-07-01 — End: 2012-06-30
  Administered 2012-06-30: 11 [IU] via SUBCUTANEOUS

## 2012-06-30 MED ORDER — DEXTROSE 5 % IV SOLN
500.0000 mg | Freq: Once | INTRAVENOUS | Status: AC
  Administered 2012-06-30: 500 mg via INTRAVENOUS
  Filled 2012-06-30: qty 500

## 2012-06-30 MED ORDER — SODIUM CHLORIDE 0.9 % IJ SOLN
3.0000 mL | Freq: Two times a day (BID) | INTRAMUSCULAR | Status: DC
  Administered 2012-06-30: 3 mL via INTRAVENOUS
  Administered 2012-07-01: 20:00:00 via INTRAVENOUS
  Administered 2012-07-01 – 2012-07-02 (×2): 3 mL via INTRAVENOUS
  Administered 2012-07-02: 10 mL via INTRAVENOUS
  Administered 2012-07-03 – 2012-07-04 (×3): 3 mL via INTRAVENOUS
  Administered 2012-07-04: 10:00:00 via INTRAVENOUS
  Administered 2012-07-05 – 2012-07-06 (×3): 3 mL via INTRAVENOUS

## 2012-06-30 MED ORDER — DEXTROSE 5 % IV SOLN
500.0000 mg | INTRAVENOUS | Status: DC
  Administered 2012-07-01 – 2012-07-02 (×2): 500 mg via INTRAVENOUS
  Filled 2012-06-30 (×4): qty 500

## 2012-06-30 MED ORDER — ALPRAZOLAM 0.25 MG PO TABS: 0.2500 mg | ORAL_TABLET | Freq: Two times a day (BID) | ORAL | Status: DC | PRN

## 2012-06-30 MED ORDER — ONDANSETRON HCL 4 MG/2ML IJ SOLN: 4.0000 mg | Freq: Four times a day (QID) | INTRAMUSCULAR | Status: DC | PRN

## 2012-06-30 MED ORDER — SODIUM CHLORIDE 0.9 % IV SOLN: 250.0000 mL | INTRAVENOUS | Status: DC | PRN

## 2012-06-30 MED ORDER — GLIPIZIDE ER 10 MG PO TB24
10.0000 mg | ORAL_TABLET | Freq: Every day | ORAL | Status: DC
  Administered 2012-06-30 – 2012-07-02 (×3): 10 mg via ORAL
  Filled 2012-06-30 (×5): qty 1

## 2012-06-30 MED ORDER — ASPIRIN 81 MG PO CHEW
324.0000 mg | CHEWABLE_TABLET | Freq: Once | ORAL | Status: AC
  Administered 2012-06-30: 324 mg via ORAL
  Filled 2012-06-30: qty 4

## 2012-06-30 MED ORDER — ZOLPIDEM TARTRATE 5 MG PO TABS: 5.0000 mg | ORAL_TABLET | Freq: Every evening | ORAL | Status: DC | PRN

## 2012-06-30 MED ORDER — HEPARIN (PORCINE) IN NACL 100-0.45 UNIT/ML-% IJ SOLN
1500.0000 [IU]/h | INTRAMUSCULAR | Status: DC
  Administered 2012-06-30: 1100 [IU]/h via INTRAVENOUS
  Administered 2012-07-01: 1500 [IU]/h via INTRAVENOUS
  Filled 2012-06-30 (×2): qty 250

## 2012-06-30 MED ORDER — SODIUM CHLORIDE 0.9 % IV SOLN
INTRAVENOUS | Status: DC
  Administered 2012-06-30: 23:00:00 via INTRAVENOUS

## 2012-06-30 MED ORDER — POTASSIUM CHLORIDE CRYS ER 20 MEQ PO TBCR
30.0000 meq | EXTENDED_RELEASE_TABLET | Freq: Two times a day (BID) | ORAL | Status: AC
  Administered 2012-06-30 (×2): 30 meq via ORAL
  Filled 2012-06-30 (×4): qty 1

## 2012-06-30 MED ORDER — NITROGLYCERIN 0.4 MG SL SUBL
0.4000 mg | SUBLINGUAL_TABLET | SUBLINGUAL | Status: DC | PRN
  Administered 2012-07-01 (×4): 0.4 mg via SUBLINGUAL
  Filled 2012-06-30 (×2): qty 25

## 2012-06-30 MED ORDER — NITROGLYCERIN 0.4 MG SL SUBL: 0.4000 mg | SUBLINGUAL_TABLET | Freq: Once | SUBLINGUAL | Status: DC

## 2012-06-30 MED ORDER — SODIUM CHLORIDE 0.9 % IJ SOLN: 3.0000 mL | INTRAMUSCULAR | Status: DC | PRN

## 2012-06-30 MED ORDER — SENNOSIDES-DOCUSATE SODIUM 8.6-50 MG PO TABS
1.0000 | ORAL_TABLET | Freq: Every evening | ORAL | Status: DC | PRN
  Administered 2012-06-30: 1 via ORAL
  Filled 2012-06-30 (×2): qty 1

## 2012-06-30 MED ORDER — MAGNESIUM HYDROXIDE 400 MG/5ML PO SUSP: 15.0000 mL | Freq: Every day | ORAL | Status: DC | PRN

## 2012-06-30 MED ORDER — ATORVASTATIN CALCIUM 80 MG PO TABS
80.0000 mg | ORAL_TABLET | Freq: Every day | ORAL | Status: DC
  Administered 2012-06-30 – 2012-07-08 (×9): 80 mg via ORAL
  Filled 2012-06-30 (×10): qty 1

## 2012-06-30 MED ORDER — ASPIRIN EC 81 MG PO TBEC
81.0000 mg | DELAYED_RELEASE_TABLET | Freq: Every day | ORAL | Status: DC
  Administered 2012-07-01 – 2012-07-09 (×8): 81 mg via ORAL
  Filled 2012-06-30 (×9): qty 1

## 2012-06-30 MED ORDER — ACETAMINOPHEN 325 MG PO TABS: 650.0000 mg | ORAL_TABLET | ORAL | Status: DC | PRN

## 2012-06-30 MED ORDER — FUROSEMIDE 10 MG/ML IJ SOLN
80.0000 mg | Freq: Two times a day (BID) | INTRAMUSCULAR | Status: DC
  Administered 2012-06-30 – 2012-07-06 (×13): 80 mg via INTRAVENOUS
  Filled 2012-06-30 (×6): qty 8
  Filled 2012-06-30: qty 4
  Filled 2012-06-30 (×11): qty 8

## 2012-06-30 MED ORDER — GUAIFENESIN 100 MG/5ML PO SYRP
200.0000 mg | ORAL_SOLUTION | ORAL | Status: DC | PRN
  Administered 2012-06-30: 200 mg via ORAL
  Filled 2012-06-30: qty 10

## 2012-06-30 MED ORDER — DEXTROSE 5 % IV SOLN
1.0000 g | INTRAVENOUS | Status: DC
  Administered 2012-07-01 – 2012-07-03 (×3): 1 g via INTRAVENOUS
  Filled 2012-06-30 (×3): qty 10

## 2012-06-30 MED ORDER — INSULIN ASPART 100 UNIT/ML ~~LOC~~ SOLN
0.0000 [IU] | SUBCUTANEOUS | Status: DC
  Administered 2012-06-30: 11 [IU] via SUBCUTANEOUS

## 2012-06-30 MED ORDER — HEPARIN BOLUS VIA INFUSION
4000.0000 [IU] | Freq: Once | INTRAVENOUS | Status: AC
  Administered 2012-06-30: 4000 [IU] via INTRAVENOUS
  Filled 2012-06-30: qty 4000

## 2012-06-30 MED ORDER — POTASSIUM CHLORIDE IN NACL 20-0.9 MEQ/L-% IV SOLN
Freq: Once | INTRAVENOUS | Status: AC
  Administered 2012-06-30: 15:00:00 via INTRAVENOUS
  Filled 2012-06-30: qty 1000

## 2012-06-30 NOTE — ED Notes (Signed)
Lactic acid results callled to primary nurse Lenon Curt

## 2012-06-30 NOTE — Consult Note (Signed)
[facility] Medical Consultation  Joshua Vincent ZOX:[id number] DOB: [birth year] DOA: 06/30/2012 PCP: Benita Stabile, MD   Requesting physician: Melba Coon, MD (cardiology) Date of consultation: 06/30/12 Reason for consultation: Diabetes, right shoulder OA  Impression/Recommendations Principal Problem:   Acute on chronic diastolic CHF (congestive heart failure), NYHA class 4 Active Problems:   DM   HYPERTENSION   OSTEOARTHRITIS, SHOULDER, RIGHT   OSA (obstructive sleep apnea)   Non-ST elevation myocardial infarction (NSTEMI), initial care episode   Acute renal failure   Hyponatremia   Community acquired pneumonia   Hypokalemia    1. Hyperglycemic Pt with AG=14, who has received Novalog 11 unit@ 1723 and Lantus 35 unit @ 2137. Start gentle hydration w/ NS, will obtain serum osmolality ( HHS) Addendum; patient does not quite meet criteria for Hyperosmolar hyperglycemic state (HHS), however will treat as such. Most likely brought on by patient's pneumonia and acute MI.  Patient BNP =52044 therefore not be able to treat aggressively. Started patient on gentle hydration, and moderate SSI. In a.m. May bump up the hydration slightly still need to be conservative secondary to CHF/acute MI. We'll monitor her electrolytes every 4 hours and replace aggressively. 2. Pneumonia; continue treatment with CAP protocol. Maintain oxygen via nasal cannula to assure patient's SpO2> 94%. After patient's blood glucose level but controlled will add a three-day steroid burst. 3. HSS;. In a.m. Will increase patient's SSI to SSI resistant protocol and may increase normal saline flowrate. Again will have to be conservative secondary to patient's acute heart condition 4. SOB; patient patient's currently on 4 L O2 via nasal cannula 5. Right shoulder pain; no complaints at this time  I will followup again tomorrow. Please contact me if I can be of assistance in the meanwhile. Thank you for this  consultation.  Chief Complaint: Acute SOB  HPI:  Joshua Vincent is a 78 y.o. WM PMHx CAD, HTN, DM (fairly well controlled per patient, last hemoglobin A1c =7 the patient) current hemoglobin A1c= 6.9, acute renal failure/chronic renal insufficiency (cr= 1.4 in December 2013).  He reports feeling generally bad for 3 days. Per family, over the last 2 days he has eaten almost nothing and has not taken his medications. Stayed in bed.  The patient describes some chest discomfort but states it was only with breathing. He has had some shortness of breath and has dyspnea on exertion was worse. He has had a nonproductive cough. There have been no fevers. He denies lower extremity edema. He has not been taking all of his medications and cannot state which of his medications he is on. According to the medicine bottles he has with him, 30 day supplies of his Cardura and Lasix were last filled in April. The patient states that he has the medicines he takes them as prescribed.  He denies PND but may have had orthopnea. Today, his symptoms worsened and he called EMS. In route to the hospital, EMS gave him sublingual nitroglycerin which helped his respiratory status. In the emergency room he was hypoxic and very hypertensive. He was initially on BiPAP but was able to get off this. His oxygen saturation on a nasal cannula is 85-88%.  Review of Systems:  Negative chest pain, or shortness of breath along 4 L O2 via nasal cannula, negative pedal edema, positive nonproductive cough ,  Past Medical History  Diagnosis Date  . HTN (hypertension)   . HLD (hyperlipidemia)   . Benign prostatic hypertrophy     hx of  .  Diabetes mellitus, type 2   . Osteoarthrosis, unspecified whether generalized or localized, shoulder region   . History of tobacco abuse     quit 1992  . CAD (coronary artery disease)     4v CABG (1/11): LIMA LAD, SVG-D, SVG-OM, SVG-PLV. ; NSTEMI 08/2011 cath  revealed severe native CAD, patent SVG to OM &  LIMA to LAD, occluded SVG to RCA and SVG to Diagonal, EF 40%, no culprit lesion for PCI, med therapy  . CVA (cerebral infarction)     peri-catheterization in 12/10.  Patient initially developed double vision and MRI showed right dorsal mid-brain infarct.  Carotid dopplers with no significant disease.  Double vision has resolved.    . Systolic and diastolic CHF, acute on chronic     EF 40% by cath 08/2011; EF 55-60% by echo 09/2011  . Bladder cancer     s/p resection 7/11  . PVC's (premature ventricular contractions)     3 week  event monitor 8/11: no signifiant arrhythmias, occ PVCs  . Iron deficiency anemia   . Leg wound, left 08/2011  . Obesity   . Chronic kidney disease     hx of kidney stones   Past Surgical History  Procedure Laterality Date  . Appendectomy    . Transurethral resection of prostate    . Coronary artery bypass graft  2011  . Retinal detachment surgery    . Eye surgery  March 20 2012   Social History:  reports that he quit smoking about 22 years ago. His smoking use included Cigarettes. He has a 30 pack-year smoking history. He does not have any smokeless tobacco history on file. He reports that  drinks alcohol. He reports that he does not use illicit drugs.  Allergies  Allergen Reactions  . Niacin Itching   Family History  Problem Relation Age of Onset  . Aneurysm Father     Father died at age 69 from an embolic cerebrovascular accident from a deep vein thrombosis in his lower extremity.  . Osteoarthritis Sister   . Heart disease Sister   . Aneurysm Brother     Prior to Admission medications   Medication Sig Start Date End Date Taking? Authorizing Provider  amLODipine (NORVASC) 10 MG tablet Take 10 mg by mouth daily.   Yes Historical Provider, MD  aspirin EC 81 MG tablet Take 81 mg by mouth daily.   Yes Historical Provider, MD  atorvastatin (LIPITOR) 80 MG tablet Take 1 tablet (80 mg total) by mouth at bedtime. 08/23/11 08/22/12 Yes Jessica A Hope, PA-C   carvedilol (COREG) 12.5 MG tablet Take 1 tablet (12.5 mg total) by mouth 2 (two) times daily. 03/21/12  Yes Laurey Morale, MD  Cholecalciferol (VITAMIN D PO) Take 1 tablet by mouth daily.   Yes Historical Provider, MD  Coenzyme Q10 (COQ-10) 100 MG CAPS Take 1 capsule by mouth daily.   Yes Historical Provider, MD  doxazosin (CARDURA) 8 MG tablet Take 1 tablet (8 mg total) by mouth at bedtime. 08/24/10  Yes Laurey Morale, MD  furosemide (LASIX) 40 MG tablet Take 20-40 mg by mouth 2 (two) times daily. 40 mg every morning and 20 mg every evening 03/21/12  Yes Laurey Morale, MD  glipiZIDE (GLUCOTROL XL) 5 MG 24 hr tablet Take 10 mg by mouth daily.   Yes Historical Provider, MD  Insulin Glargine (LANTUS SOLOSTAR [place]) Inject 35 Units into the skin daily.    Yes Historical Provider, MD  lisinopril (PRINIVIL,ZESTRIL) 20  MG tablet Take 1 tablet (20 mg total) by mouth daily. 03/26/12  Yes Laurey Morale, MD  metFORMIN (GLUCOPHAGE) 1000 MG tablet Take 1 tablet (1,000 mg total) by mouth 2 (two) times daily with a meal. 09/25/11  Yes Rhonda G Barrett, PA-C  nabumetone (RELAFEN) 500 MG tablet Take 500 mg by mouth daily.   Yes Historical Provider, MD  nitroGLYCERIN (NITROSTAT) 0.4 MG SL tablet Place 1 tablet (0.4 mg total) under the tongue every 5 (five) minutes as needed. For chest pain 09/25/11  Yes Rhonda G Barrett, PA-C  potassium chloride SA (K-DUR,KLOR-CON) 20 MEQ tablet Take 1 tablet (20 mEq total) by mouth 2 (two) times daily. 11/28/11  Yes Laurey Morale, MD   Physical Exam: Blood pressure 182/97, pulse 89, temperature 97.6 F (36.4 C), temperature source Oral, resp. rate 20, height 6' (1.829 m), weight 90.5 kg (199 lb 8.3 oz), SpO2 93.00%. Filed Vitals:   06/30/12 1645 06/30/12 1700 06/30/12 1715 06/30/12 1745  BP: 176/89 167/76 176/94 182/97  Pulse: 85 84 85 89  Temp: 97.6 F (36.4 C)     TempSrc: Oral     Resp:  29 22 20   Height: 6' (1.829 m)     Weight: 90.5 kg (199 lb 8.3 oz)     SpO2: 94%  93% 90% 93%     General:  Awake and in good spirits, currently able to joke with medical staff  Cardiovascular: Regular rhythm, tachycardic, negative murmurs rubs or gallops, PT/DP 2+  Respiratory: Patient with severe Rales RML,RLL , positive expiratory wheezing  Abdomen: Soft nontender nondistended plus bowel sounds  Labs on Admission:  Basic Metabolic Panel:  Recent Labs Lab 06/30/12 1245 06/30/12 1721  NA 132* 132*  K 2.8* 2.9*  CL 93* 93*  CO2 25 25  GLUCOSE 392* 414*  BUN 45* 46*  CREATININE 2.74* 2.59*  CALCIUM 8.7 8.4  MG  --  2.1   Liver Function Tests:  Recent Labs Lab 06/30/12 1245  AST 29  ALT 15  ALKPHOS 103  BILITOT 0.8  PROT 6.4  ALBUMIN 2.1*   No results found for this basename: LIPASE, AMYLASE,  in the last 168 hours No results found for this basename: AMMONIA,  in the last 168 hours CBC:  Recent Labs Lab 06/30/12 1245  WBC 12.0*  NEUTROABS 9.8*  HGB 13.4  HCT 36.9*  MCV 79.2  PLT 211   Cardiac Enzymes:  Recent Labs Lab 06/30/12 1320 06/30/12 1721  TROPONINI 7.70* 12.11*   BNP: No components found with this basename: POCBNP,  CBG:  Recent Labs Lab 06/30/12 1637  GLUCAP 388*    Radiological Exams on Admission: Dg Chest Port 1 View  06/30/2012   *RADIOLOGY REPORT*  Clinical Data: Hypertension.  Diabetes.  Coronary artery disease. Shortness of breath.  PORTABLE CHEST - 1 VIEW  Comparison: 09/22/2011  Findings: Asymmetric airspace opacity is present throughout the right lung, but particularly confluent in the right lower lobe. Left lung appears clear.  Prior CABG noted.  Heart size within normal limits.  IMPRESSION:  1.  Asymmetric airspace opacity in the right lung, suspicious for pneumonia or pulmonary hemorrhage.  This involves at least the right upper lobe and right lower lobe.   Original Report Authenticated By: Gaylyn Rong, M.D.      Time spent: 60 minutes  Drema [place] [facility] Pager  [phone]  If 7PM-7AM, please contact night-coverage [email or web address] Password [facility] 06/30/2012, 9:05 PM

## 2012-06-30 NOTE — Progress Notes (Signed)
ANTICOAGULATION CONSULT NOTE - Initial Consult  Pharmacy Consult for Heparin Indication: chest pain/ACS  Allergies  Allergen Reactions  . Niacin Itching    Patient Measurements: Height: 6' (182.9 cm) Weight: 199 lb 8.3 oz (90.5 kg) IBW/kg (Calculated) : 77.6 Heparin Dosing Weight: 90.5 kg  Vital Signs: Temp: 97.6 F (36.4 C) (06/23 1645) Temp src: Oral (06/23 1645) BP: 167/76 mmHg (06/23 1700) Pulse Rate: 84 (06/23 1700)  Labs:  Recent Labs  06/30/12 1245 06/30/12 1320  HGB 13.4  --   HCT 36.9*  --   PLT 211  --   CREATININE 2.74*  --   TROPONINI  --  7.70*    Estimated Creatinine Clearance: 25.6 ml/min (by C-G formula based on Cr of 2.74).   Medical History: Past Medical History  Diagnosis Date  . HTN (hypertension)   . HLD (hyperlipidemia)   . Benign prostatic hypertrophy     hx of  . Diabetes mellitus, type 2   . Osteoarthrosis, unspecified whether generalized or localized, shoulder region   . History of tobacco abuse     quit 1992  . CAD (coronary artery disease)     4v CABG (1/11): LIMA LAD, SVG-D, SVG-OM, SVG-PLV. ; NSTEMI 08/2011 cath  revealed severe native CAD, patent SVG to OM & LIMA to LAD, occluded SVG to RCA and SVG to Diagonal, EF 40%, no culprit lesion for PCI, med therapy  . CVA (cerebral infarction)     peri-catheterization in 12/10.  Patient initially developed double vision and MRI showed right dorsal mid-brain infarct.  Carotid dopplers with no significant disease.  Double vision has resolved.    . Systolic and diastolic CHF, acute on chronic     EF 40% by cath 08/2011; EF 55-60% by echo 09/2011  . Bladder cancer     s/p resection 7/11  . PVC's (premature ventricular contractions)     3 week  event monitor 8/11: no signifiant arrhythmias, occ PVCs  . Iron deficiency anemia   . Leg wound, left 08/2011  . Obesity   . Chronic kidney disease     hx of kidney stones     Assessment: 42 YOM admitted for acute on chronic CHF, troponin is  elevated x 1, to start IV heparin. No cath planned for now, d/t renal failure and no chest pain. hgb 13.4, plt 211, not on anticoagulation PTA.    Goal of Therapy:  Heparin level 0.3-0.7 units/ml Monitor platelets by anticoagulation protocol: Yes   Plan:  - Heparin bolus 4000 units - Heparin infusion 1100 units/hr - F/u 8 hr heparin level at 0200 - Daily heparin level and CBC - F/u plans of treatment.  Bayard Hugger, PharmD, BCPS  Clinical Pharmacist  Pager: [phone]   06/30/2012,5:19 PM

## 2012-06-30 NOTE — ED Notes (Signed)
MD at bedside. 

## 2012-06-30 NOTE — ED Notes (Signed)
Per EMS pt hx of CHF not taken lasiz X2 days.  Pt reported weakness and sob since fri.  Pt 20g rt hand.  Pt given 4 sl nitro by ems.  104/76  86 hr, 90o2 on 15l cpap.  Pt alert oriented X4

## 2012-06-30 NOTE — ED Provider Notes (Addendum)
History     CSN: [id number]  Arrival date & time 06/30/12  1116   First MD Initiated Contact with Patient 06/30/12 1117      Chief Complaint  Patient presents with  . Shortness of Breath    (Consider location/radiation/quality/duration/timing/severity/associated sxs/prior treatment) HPI Comments: Pt brought in by EMS on CPAP due to SOB, worse gradually over the last 2-3 days.  Pt with h/o CHF, former smoker and followed by cardiology HF clinic.  Pt was supposed to see Dr. Shirlee Latch today, but was unable to.  Pt reports some coughing, no fevers, chills.  No N/v.  Low energy, poor appetite, hasn't taken his meds in a few days due to such trouble with breathing.  Denies N/V, chest pain or tightness.    Patient is a 78 y.o. male presenting with shortness of breath. The history is provided by the patient, the EMS personnel and medical records.  Shortness of Breath   Past Medical History  Diagnosis Date  . HTN (hypertension)   . HLD (hyperlipidemia)   . Benign prostatic hypertrophy     hx of  . Diabetes mellitus, type 2   . Osteoarthrosis, unspecified whether generalized or localized, shoulder region   . History of tobacco abuse     quit 1992  . CAD (coronary artery disease)     4v CABG (1/11): LIMA LAD, SVG-D, SVG-OM, SVG-PLV. ; NSTEMI 08/2011 cath  revealed severe native CAD, patent SVG to OM & LIMA to LAD, occluded SVG to RCA and SVG to Diagonal, EF 40%, no culprit lesion for PCI, med therapy  . CVA (cerebral infarction)     peri-catheterization in 12/10.  Patient initially developed double vision and MRI showed right dorsal mid-brain infarct.  Carotid dopplers with no significant disease.  Double vision has resolved.    . Systolic and diastolic CHF, acute on chronic     EF 40% by cath 08/2011; EF 55-60% by echo 09/2011  . Bladder cancer     s/p resection 7/11  . PVC's (premature ventricular contractions)     3 week  event monitor 8/11: no signifiant arrhythmias, occ PVCs  . Iron  deficiency anemia   . Leg wound, left 08/2011  . Obesity     Past Surgical History  Procedure Laterality Date  . Appendectomy    . Transurethral resection of prostate    . Coronary artery bypass graft  2011  . Retinal detachment surgery    . Eye surgery  March 20 2012    Family History  Problem Relation Age of Onset  . Aneurysm Father     Father died at age 81 from an embolic cerebrovascular accident from a deep vein thrombosis in his lower extremity.  . Osteoarthritis Sister   . Heart disease Sister   . Aneurysm Brother     History  Substance Use Topics  . Smoking status: Former Smoker -- 2.00 packs/day for 15 years    Types: Cigarettes    Quit date: 04/03/1990  . Smokeless tobacco: Not on file     Comment: patient states they just burned more than actually smoked  . Alcohol Use: Yes     Comment: occasionally      Review of Systems  Unable to perform ROS: Acuity of condition  Respiratory: Positive for shortness of breath.     Allergies  Niacin  Home Medications   Current Outpatient Rx  Name  Route  Sig  Dispense  Refill  . amLODipine (NORVASC) 10 MG  tablet   Oral   Take 10 mg by mouth daily.         Marland Kitchen aspirin EC 81 MG tablet   Oral   Take 81 mg by mouth daily.         Marland Kitchen atorvastatin (LIPITOR) 80 MG tablet   Oral   Take 1 tablet (80 mg total) by mouth at bedtime.   30 tablet   6   . carvedilol (COREG) 12.5 MG tablet   Oral   Take 1 tablet (12.5 mg total) by mouth 2 (two) times daily.         . Cholecalciferol (VITAMIN D PO)   Oral   Take 1 tablet by mouth daily.         . Coenzyme Q10 (COQ-10) 100 MG CAPS   Oral   Take 1 capsule by mouth daily.         Marland Kitchen doxazosin (CARDURA) 8 MG tablet   Oral   Take 1 tablet (8 mg total) by mouth at bedtime.   90 tablet   3   . furosemide (LASIX) 40 MG tablet   Oral   Take 20-40 mg by mouth 2 (two) times daily. 40 mg every morning and 20 mg every evening         . glipiZIDE (GLUCOTROL XL)  5 MG 24 hr tablet   Oral   Take 10 mg by mouth daily.         . Insulin Glargine (LANTUS SOLOSTAR [place])   Subcutaneous   Inject 35 Units into the skin daily.          Marland Kitchen lisinopril (PRINIVIL,ZESTRIL) 20 MG tablet   Oral   Take 1 tablet (20 mg total) by mouth daily.   30 tablet   12   . metFORMIN (GLUCOPHAGE) 1000 MG tablet   Oral   Take 1 tablet (1,000 mg total) by mouth 2 (two) times daily with a meal.   60 tablet   11   . nabumetone (RELAFEN) 500 MG tablet   Oral   Take 500 mg by mouth daily.         . nitroGLYCERIN (NITROSTAT) 0.4 MG SL tablet   Sublingual   Place 1 tablet (0.4 mg total) under the tongue every 5 (five) minutes as needed. For chest pain   25 tablet   3   . potassium chloride SA (K-DUR,KLOR-CON) 20 MEQ tablet   Oral   Take 1 tablet (20 mEq total) by mouth 2 (two) times daily.   180 tablet   3     BP 167/95  Pulse 83  Temp(Src) 97.7 F (36.5 C) (Axillary)  Resp 20  SpO2 93%  Physical Exam  Nursing note and vitals reviewed. Constitutional: He is oriented to person, place, and time. He appears well-developed and well-nourished.  HENT:  Head: Normocephalic and atraumatic.  Eyes: EOM are normal.  Neck: Neck supple. No JVD present.  Cardiovascular: Normal rate and intact distal pulses.   No murmur heard. Pulmonary/Chest: Accessory muscle usage present. He has no decreased breath sounds. He has wheezes. He has rhonchi. He has rales.  Retractions noted  Abdominal: Soft.  Neurological: He is alert and oriented to person, place, and time. He exhibits normal muscle tone. Coordination normal.  Skin: Skin is warm and dry.  Psychiatric: He has a normal mood and affect.    ED Course  Procedures (including critical care time)  CRITICAL CARE Performed by: Lear Ng. Total critical care time: 67  min Critical care time was exclusive of separately billable procedures and treating other patients. Critical care was necessary to treat or prevent  imminent or life-threatening deterioration. Critical care was time spent personally by me on the following activities: development of treatment plan with patient and/or surrogate as well as nursing, discussions with consultants, evaluation of patient's response to treatment, examination of patient, obtaining history from patient or surrogate, ordering and performing treatments and interventions, ordering and review of laboratory studies, ordering and review of radiographic studies, pulse oximetry and re-evaluation of patient's condition.   Labs Reviewed  TROPONIN I - Abnormal; Notable for the following:    Troponin I 7.70 (*)    All other components within normal limits  CBC WITH DIFFERENTIAL - Abnormal; Notable for the following:    WBC 12.0 (*)    HCT 36.9 (*)    MCHC 36.3 (*)    Neutrophils Relative % 82 (*)    Neutro Abs 9.8 (*)    Lymphocytes Relative 9 (*)    All other components within normal limits  COMPREHENSIVE METABOLIC PANEL - Abnormal; Notable for the following:    Sodium 132 (*)    Potassium 2.8 (*)    Chloride 93 (*)    Glucose, Bld 392 (*)    BUN 45 (*)    Creatinine, Ser 2.74 (*)    Albumin 2.1 (*)    GFR calc non Af Amer 21 (*)    GFR calc Af Amer 25 (*)    All other components within normal limits  POCT I-STAT 3, BLOOD GAS (G3+) - Abnormal; Notable for the following:    pO2, Arterial 75.0 (*)    Bicarbonate 24.5 (*)    All other components within normal limits  CULTURE, BLOOD (ROUTINE X 2)  CULTURE, BLOOD (ROUTINE X 2)  CBC WITH DIFFERENTIAL  PRO B NATRIURETIC PEPTIDE   Dg Chest Port 1 View  06/30/2012   *RADIOLOGY REPORT*  Clinical Data: Hypertension.  Diabetes.  Coronary artery disease. Shortness of breath.  PORTABLE CHEST - 1 VIEW  Comparison: 09/22/2011  Findings: Asymmetric airspace opacity is present throughout the right lung, but particularly confluent in the right lower lobe. Left lung appears clear.  Prior CABG noted.  Heart size within normal limits.   IMPRESSION:  1.  Asymmetric airspace opacity in the right lung, suspicious for pneumonia or pulmonary hemorrhage.  This involves at least the right upper lobe and right lower lobe.   Original Report Authenticated By: Gaylyn Rong, M.D.     1. Community acquired pneumonia   2. Hypokalemia   3. Elevated troponin   4. Renal insufficiency     ECG at time 11:27 shows SR at rrate 87, normal axis, RBBB and LAFB, no ST or T wave abn's.  Unchanged from ECG on 03/21/12.  Abn ECG.     Sat on BiPAP was 96% which I interpret to be adequate   1:13 PM PCXR shows assymetry suggestive of airspace disease per radiology.  Will cover with CAP abx, given may need ICU on BiPAP, will get blood cultures as well.  Pt's BP is mildly elevated.    2:30 PM Troponin came back positive at 7.7 in setting of respiratory distress, worse renal insuff with Cr, new of 2.7.  Will give ASA, pt denies any CP, will consult [facility] cardiology to see.  Pt will still likely need medical admission due to pneumonia, renal insuff along with ACS.    ABG is good, pt feels improved as  far as breathing, will try to wean to [place] O2.     2:35 PM Spoke to [facility] cardiology who will see pt in the ED.   3:08 PM Dr. Tenny Craw reports cardiology will admit pt, spoke to [facility], Dr. Joseph Art who will consult on pt for other medical issues at cardiologist's request   MDM  Pt with mild to moderate resp distress, h/o CHF.  No fever here.  Will check labs, BNP, troponin, obtain CXR and keep on BiPAP for now.  Will likely need diuresis and admission.          Gavin Pound. Oletta Lamas, MD 06/30/12 1431  Gavin Pound. Oletta Lamas, MD 06/30/12 1436  Gavin Pound. Adal Sereno, MD 06/30/12 [id number]

## 2012-06-30 NOTE — H&P (Signed)
History and Physical   Patient ID: Joshua Vincent MRN: [id number], DOB/AGE: May 10, 1934 78 y.o. Date of Encounter: 06/30/2012  Primary Physician: Benita Stabile, MD Primary Cardiologist: DM  Chief Complaint:  SOB  HPI: Joshua Vincent is a 78 y.o. male with a history of CAD. He reports feeling generally bad for 3 days. Per family, over the last 2 days he has eaten almost nothing and has not taken his medications. Stayed in bed.  The patient describes some chest discomfort but states it was only with breathing. He has had some shortness of breath and has dyspnea on exertion was worse. He has had a cough but it was nonproductive. There have been no fevers. He denies lower extremity edema. He has not been taking all of his medications and cannot state which of his medications he is on. According to the medicine bottles he has with him, 30 day supplies of his Cardura and Lasix were last filled in April. The patient states that he has the medicines he takes them as prescribed.  He denies PND but may have had orthopnea. Today, his symptoms worsened and he called EMS. In route to the hospital, EMS gave him sublingual nitroglycerin which helped his respiratory status. In the emergency room he was hypoxic and very hypertensive. He was initially on BiPAP but was able to get off this. His oxygen saturation on a nasal cannula is 85-88%.  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 He states there was swelling after that but this is not palpated. Currently, although his oxygen saturation is still low, he does not appear to be in acute distress.   Past Medical History  Diagnosis Date  . HTN (hypertension)   . HLD (hyperlipidemia)   . Benign prostatic hypertrophy     hx of  . Diabetes mellitus, type 2   . Osteoarthrosis, unspecified whether generalized or localized, shoulder region   . History of tobacco abuse     quit 1992  . CAD (coronary artery  disease)     4v CABG (1/11): LIMA LAD, SVG-D, SVG-OM, SVG-PLV. ; NSTEMI 08/2011 cath  revealed severe native CAD, patent SVG to OM & LIMA to LAD, occluded SVG to RCA and SVG to Diagonal, EF 40%, no culprit lesion for PCI, med therapy  . CVA (cerebral infarction)     peri-catheterization in 12/10.  Patient initially developed double vision and MRI showed right dorsal mid-brain infarct.  Carotid dopplers with no significant disease.  Double vision has resolved.    . Systolic and diastolic CHF, acute on chronic     EF 40% by cath 08/2011; EF 55-60% by echo 09/2011  . Bladder cancer     s/p resection 7/11  . PVC's (premature ventricular contractions)     3 week  event monitor 8/11: no signifiant arrhythmias, occ PVCs  . Iron deficiency anemia   . Leg wound, left 08/2011  . Obesity     Surgical History:  Past Surgical History  Procedure Laterality Date  . Appendectomy    . Transurethral resection of prostate    . Coronary artery bypass graft  2011  . Retinal detachment surgery    . Eye surgery  March 20 2012     I have reviewed the patient's current medications. Prior to Admission medications   Medication Sig Start Date End Date Taking? Authorizing Provider  amLODipine (NORVASC) 10 MG tablet Take 10 mg by mouth daily.  Yes Historical Provider, MD  aspirin EC 81 MG tablet Take 81 mg by mouth daily.   Yes Historical Provider, MD  atorvastatin (LIPITOR) 80 MG tablet Take 1 tablet (80 mg total) by mouth at bedtime. 08/23/11 08/22/12 Yes Jessica A Hope, PA-C  carvedilol (COREG) 12.5 MG tablet Take 1 tablet (12.5 mg total) by mouth 2 (two) times daily. 03/21/12  Yes Laurey Morale, MD  Cholecalciferol (VITAMIN D PO) Take 1 tablet by mouth daily.   Yes Historical Provider, MD  Coenzyme Q10 (COQ-10) 100 MG CAPS Take 1 capsule by mouth daily.   Yes Historical Provider, MD  doxazosin (CARDURA) 8 MG tablet Take 1 tablet (8 mg total) by mouth at bedtime. 08/24/10  Yes Laurey Morale, MD  furosemide  (LASIX) 40 MG tablet Take 20-40 mg by mouth 2 (two) times daily. 40 mg every morning and 20 mg every evening 03/21/12  Yes Laurey Morale, MD  glipiZIDE (GLUCOTROL XL) 5 MG 24 hr tablet Take 10 mg by mouth daily.   Yes Historical Provider, MD  Insulin Glargine (LANTUS SOLOSTAR Joshua Vincent) Inject 35 Units into the skin daily.    Yes Historical Provider, MD  lisinopril (PRINIVIL,ZESTRIL) 20 MG tablet Take 1 tablet (20 mg total) by mouth daily. 03/26/12  Yes Laurey Morale, MD  metFORMIN (GLUCOPHAGE) 1000 MG tablet Take 1 tablet (1,000 mg total) by mouth 2 (two) times daily with a meal. 09/25/11  Yes Rhonda G Barrett, PA-C  nabumetone (RELAFEN) 500 MG tablet Take 500 mg by mouth daily.   Yes Historical Provider, MD  nitroGLYCERIN (NITROSTAT) 0.4 MG SL tablet Place 1 tablet (0.4 mg total) under the tongue every 5 (five) minutes as needed. For chest pain 09/25/11  Yes Rhonda G Barrett, PA-C  potassium chloride SA (K-DUR,KLOR-CON) 20 MEQ tablet Take 1 tablet (20 mEq total) by mouth 2 (two) times daily. 11/28/11  Yes Laurey Morale, MD   Scheduled Meds:  Continuous Infusions:  PRN Meds:.    Allergies:  Allergies  Allergen Reactions  . Niacin Itching    History   Social History  . Marital Status: Married    Spouse Name: N/A    Number of Children: 3  . Years of Education: N/A   Occupational History  . security guard    Social History Main Topics  . Smoking status: Former Smoker -- 2.00 packs/day for 15 years    Types: Cigarettes    Quit date: 04/03/1990  . Smokeless tobacco: Not on file     Comment: patient states they just burned more than actually smoked  . Alcohol Use: Yes     Comment: occasionally  . Drug Use: No  . Sexually Active: Not Currently   Other Topics Concern  . Not on file   Social History Narrative   He lives with his wife and his grandson.    Family History  Problem Relation Age of Onset  . Aneurysm Father     Father died at age 31 from an embolic cerebrovascular  accident from a deep vein thrombosis in his lower extremity.  . Osteoarthritis Sister   . Heart disease Sister   . Aneurysm Brother    Family Status  Relation Status Death Age  . Father Deceased 17    Father died at age 89 from an embolic cerebrovascular accident from a deep vein thrombosis in his lower extremity.  . Brother Deceased 12's      His  brother died in his 85s from similar  circumstances of an embolic CVA from a lower extremity deep vein thrombosis  . Sister Alive 7    osteoporosis  . Sister Deceased 35's    sequelae of a war    Review of Systems:   Full 14-point review of systems otherwise negative except as noted above.  Physical Exam: Blood pressure 147/75, pulse 85, temperature 97.7 F (36.5 C), temperature source Axillary, resp. rate 18, SpO2 91.00%. General: Well developed, well nourished,male in no acute distress Head: Normocephalic, atraumatic, sclera non-icteric, no xanthomas, nares are without discharge. Dentition: Poor Neck: No carotid bruits. JVD elevated at 12 cm. No thyromegally Lungs: Good expansion bilaterally. With some expiratory wheezes, rhonchi with a wet cough and bilateral crackles, right greater than left. Heart: Regular rate and rhythm with S1 S2.  No S3 or S4.  No murmur, no rubs, or gallops appreciated. Abdomen: Soft, non-tender, non-distended with normoactive bowel sounds. No hepatomegaly. No rebound/guarding. No obvious abdominal masses. Msk:  Strength and tone appear normal for age. No joint deformities or effusions, no spine or costo-vertebral angle tenderness. Right shoulder has no obvious injuries Extremities: No clubbing or cyanosis.1+ edema upper and lower extremities.  Distal pedal pulses are 2+ in upper extrem, lower extremity distal pulses are decreased bilaterally Neuro: Alert and oriented X 3. Moves all extremities spontaneously. No focal deficits noted. Psych:  Responds to questions appropriately with a normal affect. Skin: No rashes  or lesions noted  Labs:   Lab Results  Component Value Date   WBC 12.0* 06/30/2012   HGB 13.4 06/30/2012   HCT 36.9* 06/30/2012   MCV 79.2 06/30/2012   PLT 211 06/30/2012   No results found for this basename: INR,  in the last 72 hours   Recent Labs Lab 06/30/12 1245  NA 132*  K 2.8*  CL 93*  CO2 25  BUN 45*  CREATININE 2.74*  CALCIUM 8.7  PROT 6.4  BILITOT 0.8  ALKPHOS 103  ALT 15  AST 29  GLUCOSE 392*    Recent Labs  06/30/12 1320  TROPONINI 7.70*   No results found for this basename: TROPIPOC,  in the last 72 hours Lab Results  Component Value Date   CHOL 171 12/19/2011   HDL 35.60* 12/19/2011   LDLCALC 111* 12/19/2011   TRIG 123.0 12/19/2011   Pro B Natriuretic peptide (BNP)  Date/Time Value Range Status  06/30/2012  1:20 PM 52044.0* 0 - 450 pg/mL Final  12/19/2011 12:27 PM 247.0* 0.0 - 100.0 pg/mL Final    Radiology/Studies: Dg Chest Port 1 View 06/30/2012   *RADIOLOGY REPORT*  Clinical Data: Hypertension.  Diabetes.  Coronary artery disease. Shortness of breath.  PORTABLE CHEST - 1 VIEW  Comparison: 09/22/2011  Findings: Asymmetric airspace opacity is present throughout the right lung, but particularly confluent in the right lower lobe. Left lung appears clear.  Prior CABG noted.  Heart size within normal limits.  IMPRESSION:  1.  Asymmetric airspace opacity in the right lung, suspicious for pneumonia or pulmonary hemorrhage.  This involves at least the right upper lobe and right lower lobe.   Original Report Authenticated By: Gaylyn Rong, M.D.   ECG:  Sinus rhythm, right bundle branch block is old  ASSESSMENT AND PLAN:  Principal Problem:   Acute on chronic diastolic CHF (congestive heart failure), NYHA class 4 - admit to step down, diurese starting with Lasix 80 mg IV twice a day.  Follow renal function and electrolytes closely.  Active Problems:   Non-ST elevation myocardial infarction (NSTEMI)  -  anticoagulate with heparin.  Continue to cycle  enzymes. No cathl planned for now secondary to acute renal failure and patient is without chest pain or other ischemic symptoms.  Get echo     HYPERTENSION - blood pressure significantly elevated now  Follow as diurese.       DM - IM to see to help with diabetes and other noncardiac issues.    OSTEOARTHRITIS, SHOULDER, RIGHT - IM to address    OSA (obstructive sleep apnea) - CPAP per respiratory    Acute renal failure - follow May need to contact nephrology if worsens    Hyponatremia - Follow    Pneumonia - management per IM, initial antibiotics given in the ER    Hypokalemia - supplement with 60 mEq today, recheck in a.m.  Melida Quitter, PA-C 06/30/2012 3:25 PM Beeper [phone]  Patient seen and examined.  Agree with findings of R Barrett as noted above  I have amended note to reflect my findings.  Pt presents with CHF, elevated troponin, worsening renal function.  Denies CP  Will attempt diuressi and follow  No intervention planned for now.    Dietrich Pates 06/30/2012

## 2012-07-01 DIAGNOSIS — I059 Rheumatic mitral valve disease, unspecified: Secondary | ICD-10-CM

## 2012-07-01 DIAGNOSIS — I214 Non-ST elevation (NSTEMI) myocardial infarction: Secondary | ICD-10-CM

## 2012-07-01 LAB — BASIC METABOLIC PANEL
BUN: 49 mg/dL — ABNORMAL HIGH (ref 6–23)
CO2: 28 mEq/L (ref 19–32)
Chloride: 100 mEq/L (ref 96–112)
Chloride: 101 mEq/L (ref 96–112)
Creatinine, Ser: 2.56 mg/dL — ABNORMAL HIGH (ref 0.50–1.35)
GFR calc Af Amer: 27 mL/min — ABNORMAL LOW (ref >90)
GFR calc Af Amer: 27 mL/min — ABNORMAL LOW (ref >90)
GFR calc non Af Amer: 23 mL/min — ABNORMAL LOW (ref >90)
Potassium: 3.5 mEq/L (ref 3.5–5.1)
Sodium: 138 mEq/L (ref 135–145)
Sodium: 138 mEq/L (ref 135–145)

## 2012-07-01 LAB — GLUCOSE, CAPILLARY
Glucose-Capillary: 100 mg/dL — ABNORMAL HIGH (ref 70–99)
Glucose-Capillary: 148 mg/dL — ABNORMAL HIGH (ref 70–99)
Glucose-Capillary: 183 mg/dL — ABNORMAL HIGH (ref 70–99)
Glucose-Capillary: 77 mg/dL (ref 70–99)
Glucose-Capillary: 98 mg/dL (ref 70–99)

## 2012-07-01 LAB — COMPREHENSIVE METABOLIC PANEL
ALT: 16 U/L (ref 0–53)
AST: 30 U/L (ref 0–37)
Calcium: 8.5 mg/dL (ref 8.4–10.5)
Sodium: 134 mEq/L — ABNORMAL LOW (ref 135–145)
Total Protein: 5.9 g/dL — ABNORMAL LOW (ref 6.0–8.3)

## 2012-07-01 LAB — CBC
Hemoglobin: 12.4 g/dL — ABNORMAL LOW (ref 13.0–17.0)
RBC: 4.3 MIL/uL (ref 4.22–5.81)
WBC: 10.2 10*3/uL (ref 4.0–10.5)

## 2012-07-01 LAB — OSMOLALITY: Osmolality: 304 mOsm/kg — ABNORMAL HIGH (ref 275–300)

## 2012-07-01 LAB — LIPID PANEL
HDL: 31 mg/dL — ABNORMAL LOW (ref >39)
Total CHOL/HDL Ratio: 8.2 RATIO
Triglycerides: 114 mg/dL (ref <150)

## 2012-07-01 MED ORDER — CLOPIDOGREL BISULFATE 300 MG PO TABS
600.0000 mg | ORAL_TABLET | Freq: Once | ORAL | Status: AC
  Administered 2012-07-01: 600 mg via ORAL
  Filled 2012-07-01: qty 2

## 2012-07-01 MED ORDER — INSULIN ASPART 100 UNIT/ML ~~LOC~~ SOLN
0.0000 [IU] | SUBCUTANEOUS | Status: DC
  Administered 2012-07-01: 4 [IU] via SUBCUTANEOUS
  Administered 2012-07-01: 3 [IU] via SUBCUTANEOUS
  Administered 2012-07-01: 4 [IU] via SUBCUTANEOUS

## 2012-07-01 MED ORDER — GLUCERNA SHAKE PO LIQD
237.0000 mL | ORAL | Status: DC
  Administered 2012-07-01 – 2012-07-08 (×7): 237 mL via ORAL

## 2012-07-01 MED ORDER — HEPARIN BOLUS VIA INFUSION
3000.0000 [IU] | Freq: Once | INTRAVENOUS | Status: AC
  Administered 2012-07-01: 3000 [IU] via INTRAVENOUS
  Filled 2012-07-01: qty 3000

## 2012-07-01 MED ORDER — INSULIN ASPART 100 UNIT/ML ~~LOC~~ SOLN: 10.0000 [IU] | Freq: Once | SUBCUTANEOUS | Status: DC

## 2012-07-01 MED ORDER — HEPARIN (PORCINE) IN NACL 100-0.45 UNIT/ML-% IJ SOLN
1850.0000 [IU]/h | INTRAMUSCULAR | Status: DC
  Administered 2012-07-02 – 2012-07-03 (×3): 1550 [IU]/h via INTRAVENOUS
  Administered 2012-07-05 – 2012-07-07 (×3): 1850 [IU]/h via INTRAVENOUS
  Filled 2012-07-01 (×10): qty 250

## 2012-07-01 MED ORDER — POTASSIUM CHLORIDE 20 MEQ/15ML (10%) PO LIQD
20.0000 meq | Freq: Every day | ORAL | Status: DC
  Administered 2012-07-01: 20 meq via ORAL
  Filled 2012-07-01: qty 30
  Filled 2012-07-01 (×2): qty 15

## 2012-07-01 MED ORDER — POTASSIUM CHLORIDE CRYS ER 20 MEQ PO TBCR
EXTENDED_RELEASE_TABLET | ORAL | Status: AC
  Administered 2012-07-01: 40 meq via ORAL
  Filled 2012-07-01: qty 2

## 2012-07-01 MED ORDER — NITROGLYCERIN IN D5W 200-5 MCG/ML-% IV SOLN
INTRAVENOUS | Status: AC
  Administered 2012-07-01: 20 ug via INTRAVENOUS
  Filled 2012-07-01: qty 250

## 2012-07-01 MED ORDER — POTASSIUM CHLORIDE CRYS ER 20 MEQ PO TBCR
40.0000 meq | EXTENDED_RELEASE_TABLET | Freq: Once | ORAL | Status: AC
  Administered 2012-07-01: 40 meq via ORAL
  Filled 2012-07-01: qty 2

## 2012-07-01 MED ORDER — PREDNISONE 10 MG PO TABS
10.0000 mg | ORAL_TABLET | Freq: Every day | ORAL | Status: DC
  Administered 2012-07-02: 10 mg via ORAL
  Filled 2012-07-01 (×2): qty 1

## 2012-07-01 MED ORDER — PREDNISONE 20 MG PO TABS
20.0000 mg | ORAL_TABLET | Freq: Every day | ORAL | Status: DC
  Administered 2012-07-02: 20 mg via ORAL
  Filled 2012-07-01 (×2): qty 1

## 2012-07-01 MED ORDER — POTASSIUM CHLORIDE CRYS ER 20 MEQ PO TBCR: 40.0000 meq | EXTENDED_RELEASE_TABLET | Freq: Once | ORAL | Status: AC

## 2012-07-01 MED ORDER — CLOPIDOGREL BISULFATE 75 MG PO TABS
75.0000 mg | ORAL_TABLET | Freq: Every day | ORAL | Status: DC
  Administered 2012-07-02 – 2012-07-09 (×8): 75 mg via ORAL
  Filled 2012-07-01 (×10): qty 1

## 2012-07-01 MED ORDER — FUROSEMIDE 10 MG/ML IJ SOLN
40.0000 mg | Freq: Once | INTRAMUSCULAR | Status: AC
  Administered 2012-07-01: 40 mg via INTRAVENOUS

## 2012-07-01 MED ORDER — PERFLUTREN LIPID MICROSPHERE
INTRAVENOUS | Status: AC
  Filled 2012-07-01: qty 10

## 2012-07-01 MED ORDER — MORPHINE SULFATE 2 MG/ML IJ SOLN: 2.0000 mg | Freq: Once | INTRAMUSCULAR | Status: AC

## 2012-07-01 MED ORDER — ISOSORBIDE MONONITRATE ER 60 MG PO TB24
60.0000 mg | ORAL_TABLET | Freq: Every day | ORAL | Status: DC
  Administered 2012-07-01 – 2012-07-06 (×6): 60 mg via ORAL
  Filled 2012-07-01 (×7): qty 1

## 2012-07-01 MED ORDER — POTASSIUM CHLORIDE 20 MEQ/15ML (10%) PO LIQD
40.0000 meq | Freq: Once | ORAL | Status: AC
  Administered 2012-07-01: 40 meq via ORAL

## 2012-07-01 MED ORDER — NITROGLYCERIN IN D5W 200-5 MCG/ML-% IV SOLN
5.0000 ug/min | INTRAVENOUS | Status: DC
  Administered 2012-07-02: 20 ug/min via INTRAVENOUS
  Administered 2012-07-04 – 2012-07-05 (×2): 40 ug/min via INTRAVENOUS
  Filled 2012-07-01 (×5): qty 250

## 2012-07-01 MED ORDER — MORPHINE SULFATE 2 MG/ML IJ SOLN
INTRAMUSCULAR | Status: AC
  Administered 2012-07-01: 2 mg via INTRAVENOUS
  Filled 2012-07-01: qty 1

## 2012-07-01 MED ORDER — SODIUM CHLORIDE 0.9 % IV SOLN: INTRAVENOUS | Status: DC

## 2012-07-01 MED ORDER — AMLODIPINE BESYLATE 10 MG PO TABS
10.0000 mg | ORAL_TABLET | Freq: Every day | ORAL | Status: DC
  Administered 2012-07-01 – 2012-07-09 (×9): 10 mg via ORAL
  Filled 2012-07-01 (×9): qty 1

## 2012-07-01 NOTE — Progress Notes (Addendum)
CTSP for chest pain. He had an episode of chest pain earlier and Dr. Shirlee Latch had started Imdur which he received at 1319. He presented only with dyspnea initially, so the chest pain today is new.  A short while ago, he began having recurrent central chest pressure 7/10, gradually relieved with 3 SL NTG but not to full relief. 2mg  of morphine brought down to a 3, then received 2mg  more with only slight improvement. Discomfort is back up to 5/10. Troponin peak this admission was 12. He states he generally does not complain of pain. EKG without significant change from yesterday. On exam he is comfortable appearing, lung sounds rhonchorous with moderate air movement throughout, did not appear to have orthopnea at 20 degrees. Breathing is unlabored. VSS. D/w Dr. Excell Seltzer - add IV NTG gtt and he will come see the patient as well to determine if further intervention is necessary. Dayna Dunn PA-C  Addendum: Nurse was also concerned about no UOP since 10am. Bladder scan ordered and showed 450cc. Will place foley. Pt has h/o intermittent urinary retention followed by urology, periodically requires catheterization. Dayna Dunn PA-C  Addendum: pt now pain free on NTG drip. He actually thinks his chest also feels better since placing the foley. Dr Excell Seltzer also assessed and agrees with plan to continue NTG and follow closely. Dayna Dunn PA-C

## 2012-07-01 NOTE — Progress Notes (Signed)
INITIAL NUTRITION ASSESSMENT  DOCUMENTATION CODES Per approved criteria  -Not Applicable   INTERVENTION: 1. Glucerna Shake po  Daily , each supplement provides 220 kcal and 10 grams of protein.   NUTRITION DIAGNOSIS: Inadequate oral intake  related to poor appetite as evidenced by pt reported no intake x3 days PTA.   Goal: PO intake to meet >/=90% estimated nutrition needs.   Monitor:  PO intake, weight trends, labs, I/O's  Reason for Assessment: Malnutrition Screening Tool  78 y.o. male  Admitting Dx: Acute on chronic diastolic CHF (congestive heart failure), NYHA class 4  ASSESSMENT: Pt admitted with increased shortness of breath, with hx of CHF and CAD. Pt has not been taking all of his medications for about a week.  Notes indicate pt has not been eating well for several days. Weight hx shows weight loss of 10 lbs in the past 3 months (4.7% body weight) not significant in given time frame. Pt reports that weight loss was since Friday. Was not able to eat anything for several days. Appetite remains poor related to pain in his lungs. Is willing to try Glucerna.   Height: Ht Readings from Last 1 Encounters:  06/30/12 6' (1.829 m)    Weight: Wt Readings from Last 1 Encounters:  06/30/12 199 lb 8.3 oz (90.5 kg)    Ideal Body Weight: 178 lbs   % Ideal Body Weight: 111%  Wt Readings from Last 10 Encounters:  06/30/12 199 lb 8.3 oz (90.5 kg)  03/21/12 209 lb (94.802 kg)  03/07/12 204 lb (92.534 kg)  01/25/12 208 lb 3.2 oz (94.439 kg)  12/19/11 203 lb 8 oz (92.307 kg)  12/14/11 208 lb 12.8 oz (94.711 kg)  11/07/11 202 lb (91.627 kg)  10/15/11 199 lb (90.266 kg)  10/01/11 203 lb (92.08 kg)  09/25/11 208 lb 1.6 oz (94.394 kg)    Usual Body Weight: 209 lbs   % Usual Body Weight: 95%  BMI:  Body mass index is 27.05 kg/(m^2). overweight   Estimated Nutritional Needs: Kcal: 2000-2200 Protein: 90-100 gm  Fluid: 2-2.2 L   Skin: intact   Diet Order: Carb  Control  EDUCATION NEEDS: -No education needs identified at this time   Intake/Output Summary (Last 24 hours) at 07/01/12 1002 Last data filed at 07/01/12 0900  Gross per 24 hour  Intake 1507.59 ml  Output    500 ml  Net 1007.59 ml    Last BM: 6/24   Labs:   Recent Labs Lab 06/30/12 1245 06/30/12 1721 06/30/12 2216 06/30/12 2249  NA 132* 132*  --  134*  K 2.8* 2.9*  --  2.9*  CL 93* 93*  --  96  CO2 25 25  --  27  BUN 45* 46*  --  50*  CREATININE 2.74* 2.59*  --  2.56*  CALCIUM 8.7 8.4  --  8.5  MG  --  2.1 2.1  --   PHOS  --   --   --  3.0  GLUCOSE 392* 414*  --  295*    CBG (last 3)   Recent Labs  06/30/12 2331 07/01/12 0411 07/01/12 0855  GLUCAP 303* 98 77    Scheduled Meds: . amLODipine  10 mg Oral Daily  . aspirin EC  81 mg Oral Daily  . atorvastatin  80 mg Oral QHS  . azithromycin  500 mg Intravenous Q24H  . carvedilol  12.5 mg Oral BID WC  . cefTRIAXone (ROCEPHIN)  IV  1 g Intravenous Q24H  . [  START ON 07/02/2012] clopidogrel  75 mg Oral Q breakfast  . furosemide  80 mg Intravenous BID  . glipiZIDE  10 mg Oral Q breakfast  . insulin aspart  0-20 Units Subcutaneous Q4H  . insulin aspart  10 Units Subcutaneous Once  . insulin glargine  35 Units Subcutaneous QHS  . potassium chloride  20 mEq Oral Daily  . sodium chloride  3 mL Intravenous Q12H    Continuous Infusions: . sodium chloride    . heparin 1,500 Units/hr (07/01/12 4540)    Past Medical History  Diagnosis Date  . HTN (hypertension)   . HLD (hyperlipidemia)   . Benign prostatic hypertrophy     hx of  . Diabetes mellitus, type 2   . Osteoarthrosis, unspecified whether generalized or localized, shoulder region   . History of tobacco abuse     quit 1992  . CAD (coronary artery disease)     4v CABG (1/11): LIMA LAD, SVG-D, SVG-OM, SVG-PLV. ; NSTEMI 08/2011 cath  revealed severe native CAD, patent SVG to OM & LIMA to LAD, occluded SVG to RCA and SVG to Diagonal, EF 40%, no culprit  lesion for PCI, med therapy  . CVA (cerebral infarction)     peri-catheterization in 12/10.  Patient initially developed double vision and MRI showed right dorsal mid-brain infarct.  Carotid dopplers with no significant disease.  Double vision has resolved.    . Systolic and diastolic CHF, acute on chronic     EF 40% by cath 08/2011; EF 55-60% by echo 09/2011  . Bladder cancer     s/p resection 7/11  . PVC's (premature ventricular contractions)     3 week  event monitor 8/11: no signifiant arrhythmias, occ PVCs  . Iron deficiency anemia   . Leg wound, left 08/2011  . Obesity   . Chronic kidney disease     hx of kidney stones    Past Surgical History  Procedure Laterality Date  . Appendectomy    . Transurethral resection of prostate    . Coronary artery bypass graft  2011  . Retinal detachment surgery    . Eye surgery  March 20 2012    Clarene Duke RD, [place] Pager [phone] After Hours pager [phone]

## 2012-07-01 NOTE — Consult Note (Signed)
[facility] Medical Consultation  Joshua Vincent WGN:[id number] DOB: 1934-04-16 DOA: 06/30/2012 PCP: Benita Stabile, MD   Requesting physician: Melba Coon, MD (cardiology)  Date of consultation: 06/30/12  Reason for consultation: Diabetes (HHS), hypertension, ARF/chronic renal insufficiency  Impression/Recommendations Principal Problem:   Acute on chronic diastolic CHF (congestive heart failure), NYHA class 4 Active Problems:   DM   HYPERTENSION   OSTEOARTHRITIS, SHOULDER, RIGHT   OSA (obstructive sleep apnea)   Non-ST elevation myocardial infarction (NSTEMI), initial care episode   Acute renal failure   Hyponatremia   Community acquired pneumonia   Hypokalemia    1. HHS Hyperglycemic Hyperosmolar State Pt Initial AG=14, who received Novalog 11 unit@ 1723 and Lantus 35 unit @ 2137. Started gentle hydration w/ NS, will obtain serum osmolality ( HHS) Addendum; patient does not quite meet criteria for Hyperosmolar hyperglycemic state (HHS), however will treat as such. Most likely brought on by patient's pneumonia and acute MI. Patient BNP =52044 therefore not be able to treat aggressively. Started patient on gentle hydration, and moderate SSI. Today restarted patient's normal saline at KVO(normal saline had to be stopped last night secondary to increased respiratory demand). NOTE AG=11 this a.m.  2. Pneumonia; continue antibiotic treatment with CAP protocol. Maintain oxygen via nasal cannula to assure patient's SpO2> 94%. Will start prednisone 30 mg x3 days. Note WBC decreased to 10.2  3. HSS;.Continue  SSI resistant protocol and may increase normal saline flowrate past KVO if tolerated. Again will have to be conservative secondary to patient's acute heart condition   4. SOB; patient's currently on 4 L O2 via nasal cannula, continue O2 to maintain SpO2 > 94%  5. HLD; patient's cholesterol not within NCEP guidelines on 80 mg of a statin. Will hold on starting ezetimibe   Until Pt closer to D/C  6. ARF/chronic renal insufficiency; slightly improved , would continue gentle hydration as long as patient tolerates   7. Right shoulder pain; no complaints at this time    I will followup again tomorrow. Please contact me if I can be of assistance in the meanwhile. Thank you for this consultation.  Chief Complaint: Acute SOB  HPI:  Joshua Vincent is a 78 y.o. WM PMHx CAD, HTN, DM (fairly well controlled per patient, last hemoglobin A1c =7 the patient) current hemoglobin A1c= 6.9, acute renal failure/chronic renal insufficiency (cr= 1.4 in December 2013). He reports feeling generally bad for 3 days. Per family, over the last 2 days he has eaten almost nothing and has not taken his medications. Stayed in bed.  The patient describes some chest discomfort but states it was only with breathing. He has had some shortness of breath and has dyspnea on exertion was worse. He has had a nonproductive cough. There have been no fevers. He denies lower extremity edema. He has not been taking all of his medications and cannot state which of his medications he is on. According to the medicine bottles he has with him, 30 day supplies of his Cardura and Lasix were last filled in April. The patient states that he has the medicines he takes them as prescribed.  He denies PND but may have had orthopnea. Today, his symptoms worsened and he called EMS. In route to the hospital, EMS gave him sublingual nitroglycerin which helped his respiratory status. In the emergency room he was hypoxic and very hypertensive. He was initially on BiPAP but was able to get off this. His oxygen saturation on a nasal cannula is 85-88%. Patient's  initial AG=14, AG now=11.    Review of Systems:  Positive continue shortness of breath, negative chest pain, negative nausea, positive pedal edema bilaterally  Past Medical History  Diagnosis Date  . HTN (hypertension)   . HLD (hyperlipidemia)   . Benign prostatic  hypertrophy     hx of  . Diabetes mellitus, type 2   . Osteoarthrosis, unspecified whether generalized or localized, shoulder region   . History of tobacco abuse     quit 1992  . CAD (coronary artery disease)     4v CABG (1/11): LIMA LAD, SVG-D, SVG-OM, SVG-PLV. ; NSTEMI 08/2011 cath  revealed severe native CAD, patent SVG to OM & LIMA to LAD, occluded SVG to RCA and SVG to Diagonal, EF 40%, no culprit lesion for PCI, med therapy  . CVA (cerebral infarction)     peri-catheterization in 12/10.  Patient initially developed double vision and MRI showed right dorsal mid-brain infarct.  Carotid dopplers with no significant disease.  Double vision has resolved.    . Systolic and diastolic CHF, acute on chronic     EF 40% by cath 08/2011; EF 55-60% by echo 09/2011  . Bladder cancer     s/p resection 7/11  . PVC's (premature ventricular contractions)     3 week  event monitor 8/11: no signifiant arrhythmias, occ PVCs  . Iron deficiency anemia   . Leg wound, left 08/2011  . Obesity   . Chronic kidney disease     hx of kidney stones   Past Surgical History  Procedure Laterality Date  . Appendectomy    . Transurethral resection of prostate    . Coronary artery bypass graft  2011  . Retinal detachment surgery    . Eye surgery  March 20 2012   Social History:  reports that he quit smoking about 22 years ago. His smoking use included Cigarettes. He has a 30 pack-year smoking history. He does not have any smokeless tobacco history on file. He reports that  drinks alcohol. He reports that he does not use illicit drugs.  Allergies  Allergen Reactions  . Niacin Itching   Family History  Problem Relation Age of Onset  . Aneurysm Father     Father died at age 40 from an embolic cerebrovascular accident from a deep vein thrombosis in his lower extremity.  . Osteoarthritis Sister   . Heart disease Sister   . Aneurysm Brother     Prior to Admission medications   Medication Sig Start Date End Date  Taking? Authorizing Provider  amLODipine (NORVASC) 10 MG tablet Take 10 mg by mouth daily.   Yes Historical Provider, MD  aspirin EC 81 MG tablet Take 81 mg by mouth daily.   Yes Historical Provider, MD  atorvastatin (LIPITOR) 80 MG tablet Take 1 tablet (80 mg total) by mouth at bedtime. 08/23/11 08/22/12 Yes Jessica A Hope, PA-C  carvedilol (COREG) 12.5 MG tablet Take 1 tablet (12.5 mg total) by mouth 2 (two) times daily. 03/21/12  Yes Laurey Morale, MD  Cholecalciferol (VITAMIN D PO) Take 1 tablet by mouth daily.   Yes Historical Provider, MD  Coenzyme Q10 (COQ-10) 100 MG CAPS Take 1 capsule by mouth daily.   Yes Historical Provider, MD  doxazosin (CARDURA) 8 MG tablet Take 1 tablet (8 mg total) by mouth at bedtime. 08/24/10  Yes Laurey Morale, MD  furosemide (LASIX) 40 MG tablet Take 20-40 mg by mouth 2 (two) times daily. 40 mg every morning and 20  mg every evening 03/21/12  Yes Laurey Morale, MD  glipiZIDE (GLUCOTROL XL) 5 MG 24 hr tablet Take 10 mg by mouth daily.   Yes Historical Provider, MD  Insulin Glargine (LANTUS SOLOSTAR [place]) Inject 35 Units into the skin daily.    Yes Historical Provider, MD  lisinopril (PRINIVIL,ZESTRIL) 20 MG tablet Take 1 tablet (20 mg total) by mouth daily. 03/26/12  Yes Laurey Morale, MD  metFORMIN (GLUCOPHAGE) 1000 MG tablet Take 1 tablet (1,000 mg total) by mouth 2 (two) times daily with a meal. 09/25/11  Yes Rhonda G Barrett, PA-C  nabumetone (RELAFEN) 500 MG tablet Take 500 mg by mouth daily.   Yes Historical Provider, MD  nitroGLYCERIN (NITROSTAT) 0.4 MG SL tablet Place 1 tablet (0.4 mg total) under the tongue every 5 (five) minutes as needed. For chest pain 09/25/11  Yes Rhonda G Barrett, PA-C  potassium chloride SA (K-DUR,KLOR-CON) 20 MEQ tablet Take 1 tablet (20 mEq total) by mouth 2 (two) times daily. 11/28/11  Yes Laurey Morale, MD   Physical Exam: Blood pressure 154/89, pulse 80, temperature 98.1 F (36.7 C), temperature source Oral, resp. rate 26,  height 6' (1.829 m), weight 90.5 kg (199 lb 8.3 oz), SpO2 97.00%. Filed Vitals:   07/01/12 0400 07/01/12 0745 07/01/12 0852 07/01/12 1133  BP: 134/90 132/95 156/92 154/89  Pulse: 83 73 80 80  Temp: 98.7 F (37.1 C)  99.2 F (37.3 C) 98.1 F (36.7 C)  TempSrc: Oral  Oral Oral  Resp: 31 15 21 26   Height:      Weight:      SpO2: 97% 99% 97% 97%   General: Awake and in good spirits, currently able to joke with medical staff  Cardiovascular: Regular rhythm, rate, negative murmurs rubs or gallops, PT/DP 2+  Respiratory: Patient with severe Rales RML,RLL (mildly improved) , positive expiratory wheezing  Abdomen: Soft nontender nondistended plus bowel sounds Musculoskeletal; 2+ bilateral pedal edema to midcalf  Labs on Admission:  Basic Metabolic Panel:  Recent Labs Lab 06/30/12 1245 06/30/12 1721 06/30/12 2216 06/30/12 2249 07/01/12 0925  NA 132* 132*  --  134* 138  K 2.8* 2.9*  --  2.9* 3.6  CL 93* 93*  --  96 100  CO2 25 25  --  27 28  GLUCOSE 392* 414*  --  295* 119*  BUN 45* 46*  --  50* 48*  CREATININE 2.74* 2.59*  --  2.56* 2.56*  CALCIUM 8.7 8.4  --  8.5 8.4  MG  --  2.1 2.1  --   --   PHOS  --   --   --  3.0  --    Liver Function Tests:  Recent Labs Lab 06/30/12 1245 06/30/12 2249  AST 29 30  ALT 15 16  ALKPHOS 103 94  BILITOT 0.8 0.4  PROT 6.4 5.9*  ALBUMIN 2.1* 2.0*   No results found for this basename: LIPASE, AMYLASE,  in the last 168 hours No results found for this basename: AMMONIA,  in the last 168 hours CBC:  Recent Labs Lab 06/30/12 1245 07/01/12 0255  WBC 12.0* 10.2  NEUTROABS 9.8*  --   HGB 13.4 12.4*  HCT 36.9* 33.9*  MCV 79.2 78.8  PLT 211 206   Cardiac Enzymes:  Recent Labs Lab 06/30/12 1320 06/30/12 1721 06/30/12 2249 07/01/12 0450  TROPONINI 7.70* 12.11* 10.24* 4.95*   BNP: No components found with this basename: POCBNP,  CBG:  Recent Labs Lab 06/30/12 2136 06/30/12  2331 07/01/12 0411 07/01/12 0855  07/01/12 1137  GLUCAP 322* 303* 98 77 148*    Radiological Exams on Admission: Dg Chest Port 1 View  06/30/2012   *RADIOLOGY REPORT*  Clinical Data: Hypertension.  Diabetes.  Coronary artery disease. Shortness of breath.  PORTABLE CHEST - 1 VIEW  Comparison: 09/22/2011  Findings: Asymmetric airspace opacity is present throughout the right lung, but particularly confluent in the right lower lobe. Left lung appears clear.  Prior CABG noted.  Heart size within normal limits.  IMPRESSION:  1.  Asymmetric airspace opacity in the right lung, suspicious for pneumonia or pulmonary hemorrhage.  This involves at least the right upper lobe and right lower lobe.   Original Report Authenticated By: Gaylyn Rong, M.D.    EKG: No new EKG this a.m.  Time spent: 30 minutes  Drema Dallas [facility] Pager [phone]  If 7PM-7AM, please contact night-coverage [email or web address] Password [email or web address] 07/01/2012, 1:16 PM

## 2012-07-01 NOTE — Progress Notes (Signed)
Joshua Vincent notified of on-going chest pain.

## 2012-07-01 NOTE — Progress Notes (Signed)
C/o "stabbing" chest pain. 1 SL NTG given. Will continue to assess.

## 2012-07-01 NOTE — Progress Notes (Signed)
  Echocardiogram 2D Echocardiogram has been performed.  Kier Smead 07/01/2012, 2:36 PM

## 2012-07-01 NOTE — Progress Notes (Signed)
Inpatient Diabetes Program Recommendations  AACE/ADA: New Consensus Statement on Inpatient Glycemic Control (2013)  Target Ranges:  Prepandial:   less than 140 mg/dL      Peak postprandial:   less than 180 mg/dL (1-2 hours)      Critically ill patients:  140 - 180 mg/dL  Results for PRECIOUS, GILCHREST (MRN [id number]) as of 07/01/2012 10:52  Ref. Range 07/01/2012 04:11 07/01/2012 08:55  Glucose-Capillary Latest Range: 70-99 mg/dL 98 77   Inpatient Diabetes Program Recommendations Correction (SSI): Decrease to moderate scale  Thank you  Piedad Climes BSN, RN,CDE Inpatient Diabetes Coordinator [phone] (team pager)

## 2012-07-01 NOTE — Care Management Note (Addendum)
    Page 1 of 1   07/07/2012     12:05:01 PM   CARE MANAGEMENT NOTE 07/07/2012  Patient:  Joshua Vincent, Joshua Vincent   Account Number:  [id number]  Date Initiated:  07/01/2012  Documentation initiated by:  Junius Creamer  Subjective/Objective Assessment:   adm w pneumonia     Action/Plan:   lives w wife, pcp dr Melba Coon   Anticipated DC Date:     Anticipated DC Plan:        DC Planning Services  CM consult  Medication Assistance      Choice offered to / List presented to:             Status of service:   Medicare Important Message given?   (If response is "NO", the following Medicare IM given date fields will be blank) Date Medicare IM given:   Date Additional Medicare IM given:    Discharge Disposition:    Per UR Regulation:  Reviewed for med. necessity/level of care/duration of stay  If discussed at Long Length of Stay Meetings, dates discussed:   07/08/2012    Comments:  6/30 1202p debbie Nathan Stallworth rn,bsn spoke w pt. he has Rosann Auerbach is for part d medicare. he is in donut hole. he meds on 4.00 list from [employer] but brand name meds having trouble getting while in donut hole. gave him pt assist forms for novolog-lantus-relafen-cardura. gave pt 2 prescription doscount cards that may help w brand name meds.

## 2012-07-01 NOTE — Progress Notes (Signed)
C/o chest pain "6" sudden onset. VS taken and stable. Pt sitting in chair talking on phone. 12 lead EKG obtained. SL NTG given per orders. Dr Shirlee Latch notified. At this time, pain has subsided. Will continue to monitor.

## 2012-07-01 NOTE — Progress Notes (Signed)
ANTICOAGULATION CONSULT NOTE - Follow Up Consult  Pharmacy Consult for heparin Indication: NSTEMI  Labs:  Recent Labs  06/30/12 1245 06/30/12 1320 06/30/12 1721 06/30/12 2249 07/01/12 0255  HGB 13.4  --   --   --  12.4*  HCT 36.9*  --   --   --  33.9*  PLT 211  --   --   --  206  HEPARINUNFRC  --   --   --   --  <0.10*  CREATININE 2.74*  --  2.59* 2.56*  --   TROPONINI  --  7.70* 12.11* 10.24*  --     Assessment: 77yo male undetectable on heparin with initial doing for NSTEMI.  Goal of Therapy:  Heparin level 0.3-0.7 units/ml   Plan:  Will rebolus with 3000 units of heparin and increase gtt by 4 units/kg/hr to 1500 units/hr and check level in 8hr.  Vernard Gambles, PharmD, BCPS  07/01/2012,4:53 AM

## 2012-07-01 NOTE — Progress Notes (Signed)
Patient ID: Joshua Vincent, male   DOB: 09-30-1934, 78 y.o.   MRN: [id number]    SUBJECTIVE: Mr Touchet was admitted yesterday with dyspnea/hypoxemia.  He had been off some of his medications for over a week (not sure which).  He has had trouble getting all his medications in the past.  No chest pain at all. He is currently on Bipap.  CXR concerning for PNA versus asymmetric edema.   Marland Kitchen amLODipine  10 mg Oral Daily  . aspirin EC  81 mg Oral Daily  . atorvastatin  80 mg Oral QHS  . azithromycin  500 mg Intravenous Q24H  . carvedilol  12.5 mg Oral BID WC  . cefTRIAXone (ROCEPHIN)  IV  1 g Intravenous Q24H  . clopidogrel  600 mg Oral Once  . [START ON 07/02/2012] clopidogrel  75 mg Oral Q breakfast  . furosemide  80 mg Intravenous BID  . glipiZIDE  10 mg Oral Q breakfast  . insulin aspart  0-20 Units Subcutaneous Q4H  . insulin glargine  35 Units Subcutaneous QHS  . potassium chloride  20 mEq Oral Daily  . potassium chloride SA      . potassium chloride  40 mEq Oral Once  . sodium chloride  3 mL Intravenous Q12H  heparin gtt    Filed Vitals:   07/01/12 0000 07/01/12 0200 07/01/12 0315 07/01/12 0400  BP: 154/83 160/90  134/90  Pulse: 77 77 82 83  Temp: 98 F (36.7 C)   98.7 F (37.1 C)  TempSrc: Oral   Oral  Resp: 20 20 23 31   Height:      Weight:      SpO2: 94% 94% 92% 97%    Intake/Output Summary (Last 24 hours) at 07/01/12 0739 Last data filed at 07/01/12 0600  Gross per 24 hour  Intake 1222.59 ml  Output    500 ml  Net 722.59 ml    LABS: Basic Metabolic Panel:  Recent Labs  78/29/56 1721 06/30/12 2216 06/30/12 2249  NA 132*  --  134*  K 2.9*  --  2.9*  CL 93*  --  96  CO2 25  --  27  GLUCOSE 414*  --  295*  BUN 46*  --  50*  CREATININE 2.59*  --  2.56*  CALCIUM 8.4  --  8.5  MG 2.1 2.1  --   PHOS  --   --  3.0   Liver Function Tests:  Recent Labs  06/30/12 1245 06/30/12 2249  AST 29 30  ALT 15 16  ALKPHOS 103 94  BILITOT 0.8 0.4  PROT 6.4 5.9*    ALBUMIN 2.1* 2.0*   No results found for this basename: LIPASE, AMYLASE,  in the last 72 hours CBC:  Recent Labs  06/30/12 1245 07/01/12 0255  WBC 12.0* 10.2  NEUTROABS 9.8*  --   HGB 13.4 12.4*  HCT 36.9* 33.9*  MCV 79.2 78.8  PLT 211 206   Cardiac Enzymes:  Recent Labs  06/30/12 1721 06/30/12 2249 07/01/12 0450  TROPONINI 12.11* 10.24* 4.95*   BNP: No components found with this basename: POCBNP,  D-Dimer: No results found for this basename: DDIMER,  in the last 72 hours Hemoglobin A1C:  Recent Labs  06/30/12 1721  HGBA1C 6.9*   Fasting Lipid Panel: No results found for this basename: CHOL, HDL, LDLCALC, TRIG, CHOLHDL, LDLDIRECT,  in the last 72 hours Thyroid Function Tests:  Recent Labs  06/30/12 1721  TSH 2.745   Anemia  Panel: No results found for this basename: VITAMINB12, FOLATE, FERRITIN, TIBC, IRON, RETICCTPCT,  in the last 72 hours  RADIOLOGY: Dg Chest Port 1 View  06/30/2012   *RADIOLOGY REPORT*  Clinical Data: Hypertension.  Diabetes.  Coronary artery disease. Shortness of breath.  PORTABLE CHEST - 1 VIEW  Comparison: 09/22/2011  Findings: Asymmetric airspace opacity is present throughout the right lung, but particularly confluent in the right lower lobe. Left lung appears clear.  Prior CABG noted.  Heart size within normal limits.  IMPRESSION:  1.  Asymmetric airspace opacity in the right lung, suspicious for pneumonia or pulmonary hemorrhage.  This involves at least the right upper lobe and right lower lobe.   Original Report Authenticated By: Gaylyn Rong, M.D.    PHYSICAL EXAM General: NAD Neck: JVP difficult, no thyromegaly or thyroid nodule.  Lungs: Rhonchi throughout lung fields bilaterally.  CV: Nondisplaced PMI.  Heart regular S1/S2, no S3/S4, no murmur. 1+ ankle edema.  No carotid bruit.  Normal pedal pulses.  Abdomen: Soft, nontender, no hepatosplenomegaly, no distention.  Neurologic: Alert and oriented x 3.  Psych: Normal  affect. Extremities: No clubbing or cyanosis.   TELEMETRY: Reviewed telemetry pt in NSR with PVCs  ASSESSMENT AND PLAN: 78 yo with history of CAD s/p CABG, diastolic CHF, and CKD presented with dyspnea/hypoxemia yesterday.  CXR concerning for PNA versus asymmetric edema, BNP 52K, Troponin peaked at 12, creatinine up to 2.7 now 2.56.  This morning he remains on Bipap.  No chest pain.  Breathing better.  1. Pulmonary: CXR with PNA versus asymmetric pulmonary edema.  WBCs not elevated today and not febrile.  He has been started on ceftriaxone/azithromycin to treat PNA.  Lung exam consistent with PNA with rhonchi.  BNP is quite high but exam difficult for volume (JVP does not appear markedly elevated).  He is on Lasix IV which I will continue for now.   2. Diabetes: Blood glucose now under better control.  IM consulted yesterday to help with management.  3. CAD: s/p CABG.  Last cath with 2/4 patent vein grafts.  Troponin to 12 this admission suggestive of NSTEMI though he denies any chest pain (only symptom has been dyspnea).  His creatinine was 2.7 yesterday, now 2.56 (baseline about 1.4).  Hold off on LHC for now and follow creatinine trend.   - Medical treatment for NSTEMI: Will given Plavix, ASA, continue heparin gtt, atorvastatin 80, Coreg.  4. Renal: Acute on chronic kidney injury.  Baseline creatinine 1.4.  Creatinine a bit lower today.  Will need to follow closely while getting IV Lasix.  5. Acute on chronic presumed diastolic CHF: Last echo with preserved EF.  BNP very elevated, exam is difficult for volume.  Possible asymmetric edema (versus PNA) on CXR.   - Continue Lasix 80 mg IV bid for now, follow clinical response.   Will need to replete K  and follow creatinine.  - Echocardiogram today.  6. HTN: Add back home amlodipine.  Holding ACEI with elevated creatinine.   Marca Ancona 07/01/2012 7:50 AM

## 2012-07-01 NOTE — Progress Notes (Signed)
ANTICOAGULATION CONSULT NOTE - Follow Up Consult  Pharmacy Consult:  Heparin Indication:  NSTEMI  Allergies  Allergen Reactions  . Niacin Itching    Patient Measurements: Height: 6' (182.9 cm) Weight: 199 lb 8.3 oz (90.5 kg) IBW/kg (Calculated) : 77.6 Heparin Dosing Weight: 90 kg   Vital Signs: Temp: 98.1 F (36.7 C) (06/24 1133) Temp src: Oral (06/24 1133) BP: 154/89 mmHg (06/24 1133) Pulse Rate: 80 (06/24 1133)  Labs:  Recent Labs  06/30/12 1245  06/30/12 1721 06/30/12 2249 07/01/12 0255 07/01/12 0450 07/01/12 0925 07/01/12 1245  HGB 13.4  --   --   --  12.4*  --   --   --   HCT 36.9*  --   --   --  33.9*  --   --   --   PLT 211  --   --   --  206  --   --   --   HEPARINUNFRC  --   --   --   --  <0.10*  --   --  0.35  CREATININE 2.74*  --  2.59* 2.56*  --   --  2.56* 2.55*  TROPONINI  --   < > 12.11* 10.24*  --  4.95*  --   --   < > = values in this interval not displayed.  Estimated Creatinine Clearance: 27.5 ml/min (by C-G formula based on Cr of 2.55).       Assessment: 85 YOM with NSTEMI to continue on IV heparin.  Heparin level therapeutic; no bleeding reported.  Noted no plan for cath due to renal function and patient is asymptomatic.   Goal of Therapy:  Heparin level 0.3-0.7 units/ml Monitor platelets by anticoagulation protocol: Yes    Plan:  - Increase heparin gtt to 1550 units/hr - Daily HL / CBC    Jaterrius Ricketson D. Laney Potash, PharmD, BCPS Pager:  [phone] 07/01/2012, 1:56 PM

## 2012-07-02 ENCOUNTER — Inpatient Hospital Stay (HOSPITAL_COMMUNITY): Payer: Medicare Other

## 2012-07-02 DIAGNOSIS — I5043 Acute on chronic combined systolic (congestive) and diastolic (congestive) heart failure: Secondary | ICD-10-CM

## 2012-07-02 DIAGNOSIS — J189 Pneumonia, unspecified organism: Secondary | ICD-10-CM

## 2012-07-02 LAB — GLUCOSE, CAPILLARY
Glucose-Capillary: 89 mg/dL (ref 70–99)
Glucose-Capillary: 151 mg/dL — ABNORMAL HIGH (ref 70–99)

## 2012-07-02 LAB — HEPARIN LEVEL (UNFRACTIONATED): Heparin Unfractionated: 0.38 IU/mL (ref 0.30–0.70)

## 2012-07-02 LAB — BASIC METABOLIC PANEL
GFR calc Af Amer: 28 mL/min — ABNORMAL LOW (ref >90)
GFR calc non Af Amer: 24 mL/min — ABNORMAL LOW (ref >90)
Glucose, Bld: 58 mg/dL — ABNORMAL LOW (ref 70–99)
Potassium: 3 mEq/L — ABNORMAL LOW (ref 3.5–5.1)
Sodium: 137 mEq/L (ref 135–145)

## 2012-07-02 LAB — CBC
Hemoglobin: 11.2 g/dL — ABNORMAL LOW (ref 13.0–17.0)
MCH: 28.9 pg (ref 26.0–34.0)
RBC: 3.88 MIL/uL — ABNORMAL LOW (ref 4.22–5.81)

## 2012-07-02 MED ORDER — INSULIN GLARGINE 100 UNIT/ML ~~LOC~~ SOLN
25.0000 [IU] | Freq: Every day | SUBCUTANEOUS | Status: DC
  Filled 2012-07-02: qty 0.25

## 2012-07-02 MED ORDER — POTASSIUM CHLORIDE CRYS ER 20 MEQ PO TBCR
40.0000 meq | EXTENDED_RELEASE_TABLET | Freq: Two times a day (BID) | ORAL | Status: DC
  Administered 2012-07-02 – 2012-07-08 (×14): 40 meq via ORAL
  Filled 2012-07-02 (×16): qty 2

## 2012-07-02 MED ORDER — POTASSIUM CHLORIDE CRYS ER 20 MEQ PO TBCR
EXTENDED_RELEASE_TABLET | ORAL | Status: AC
  Filled 2012-07-02: qty 1

## 2012-07-02 MED ORDER — POTASSIUM CHLORIDE CRYS ER 20 MEQ PO TBCR
20.0000 meq | EXTENDED_RELEASE_TABLET | Freq: Once | ORAL | Status: AC
  Administered 2012-07-02: 20 meq via ORAL

## 2012-07-02 MED ORDER — INSULIN ASPART 100 UNIT/ML ~~LOC~~ SOLN
0.0000 [IU] | Freq: Three times a day (TID) | SUBCUTANEOUS | Status: DC
  Administered 2012-07-02: 3 [IU] via SUBCUTANEOUS
  Administered 2012-07-02: 8 [IU] via SUBCUTANEOUS
  Administered 2012-07-03: 2 [IU] via SUBCUTANEOUS
  Administered 2012-07-03: 3 [IU] via SUBCUTANEOUS
  Administered 2012-07-03: 2 [IU] via SUBCUTANEOUS
  Administered 2012-07-04: 8 [IU] via SUBCUTANEOUS
  Administered 2012-07-04 – 2012-07-05 (×2): 3 [IU] via SUBCUTANEOUS
  Administered 2012-07-05: 8 [IU] via SUBCUTANEOUS
  Administered 2012-07-06: 3 [IU] via SUBCUTANEOUS
  Administered 2012-07-06: 8 [IU] via SUBCUTANEOUS
  Administered 2012-07-07: 3 [IU] via SUBCUTANEOUS
  Administered 2012-07-08: 13:00:00 via SUBCUTANEOUS
  Administered 2012-07-08: 5 [IU] via SUBCUTANEOUS
  Administered 2012-07-09: 2 [IU] via SUBCUTANEOUS

## 2012-07-02 MED ORDER — INSULIN ASPART 100 UNIT/ML ~~LOC~~ SOLN
0.0000 [IU] | Freq: Every day | SUBCUTANEOUS | Status: DC
  Administered 2012-07-02: 5 [IU] via SUBCUTANEOUS
  Administered 2012-07-04: 3 [IU] via SUBCUTANEOUS
  Administered 2012-07-05: 2 [IU] via SUBCUTANEOUS
  Administered 2012-07-06: 4 [IU] via SUBCUTANEOUS
  Administered 2012-07-07: 5 [IU] via SUBCUTANEOUS
  Administered 2012-07-08: 2 [IU] via SUBCUTANEOUS

## 2012-07-02 MED ORDER — GLUCOSE 40 % PO GEL
ORAL | Status: AC
  Administered 2012-07-02: 37.5 g
  Filled 2012-07-02: qty 1

## 2012-07-02 MED ORDER — FUROSEMIDE 10 MG/ML IJ SOLN
60.0000 mg | Freq: Once | INTRAMUSCULAR | Status: AC
  Administered 2012-07-02: 60 mg via INTRAVENOUS

## 2012-07-02 MED ORDER — FUROSEMIDE 10 MG/ML IJ SOLN
INTRAMUSCULAR | Status: AC
  Filled 2012-07-02: qty 8

## 2012-07-02 MED ORDER — INSULIN GLARGINE 100 UNIT/ML ~~LOC~~ SOLN
28.0000 [IU] | Freq: Every day | SUBCUTANEOUS | Status: DC
  Administered 2012-07-02 – 2012-07-03 (×2): 28 [IU] via SUBCUTANEOUS
  Filled 2012-07-02 (×3): qty 0.28

## 2012-07-02 NOTE — Progress Notes (Signed)
Patient ID: Joshua Vincent, male   DOB: [birth year], 78 y.o.   MRN: [id number]    SUBJECTIVE: Joshua Vincent was admitted  with dyspnea/hypoxemia.  He had been off some of his medications for over a week (not sure which).  He has had trouble getting all his medications in the past.   Severe chest pain yesterday with evolving elevation in troponin but no acute ECG changes. Seen by Dr Excell Seltzer and cath deferred Much improved this am with no pain and dyspnea improved  . amLODipine  10 mg Oral Daily  . aspirin EC  81 mg Oral Daily  . atorvastatin  80 mg Oral QHS  . azithromycin  500 mg Intravenous Q24H  . carvedilol  12.5 mg Oral BID WC  . cefTRIAXone (ROCEPHIN)  IV  1 g Intravenous Q24H  . clopidogrel  75 mg Oral Q breakfast  . feeding supplement  237 mL Oral Q24H  . furosemide  80 mg Intravenous BID  . insulin aspart  0-20 Units Subcutaneous Q4H  . insulin aspart  10 Units Subcutaneous Once  . insulin glargine  25 Units Subcutaneous QHS  . isosorbide mononitrate  60 mg Oral Daily  . potassium chloride  20 mEq Oral Daily  . predniSONE  10 mg Oral Q breakfast  . predniSONE  20 mg Oral Q breakfast  . sodium chloride  3 mL Intravenous Q12H  heparin gtt    Filed Vitals:   07/02/12 0400 07/02/12 0414 07/02/12 0800 07/02/12 0834  BP:  147/97  150/88  Pulse:  72  82  Temp:  97.7 F (36.5 C) 98.9 F (37.2 C)   TempSrc:  Axillary Oral   Resp:  15  17  Height:      Weight: 203 lb 11.3 oz (92.4 kg)     SpO2:  100%  99%    Intake/Output Summary (Last 24 hours) at 07/02/12 1025 Last data filed at 07/02/12 1000  Gross per 24 hour  Intake   1658 ml  Output   3025 ml  Net  -1367 ml    LABS: Basic Metabolic Panel:  Recent Labs  62/95/28 1721 06/30/12 2216 06/30/12 2249  07/01/12 1245 07/02/12 0355  NA 132*  --  134*  < > 138 137  K 2.9*  --  2.9*  < > 3.5 3.0*  CL 93*  --  96  < > 101 102  CO2 25  --  27  < > 28 28  GLUCOSE 414*  --  295*  < > 142* 58*  BUN 46*  --  50*  < > 49*  47*  CREATININE 2.59*  --  2.56*  < > 2.55* 2.48*  CALCIUM 8.4  --  8.5  < > 8.2* 8.1*  MG 2.1 2.1  --   --   --   --   PHOS  --   --  3.0  --   --   --   < > = values in this interval not displayed. Liver Function Tests:  Recent Labs  06/30/12 1245 06/30/12 2249  AST 29 30  ALT 15 16  ALKPHOS 103 94  BILITOT 0.8 0.4  PROT 6.4 5.9*  ALBUMIN 2.1* 2.0*   CBC:  Recent Labs  06/30/12 1245 07/01/12 0255 07/02/12 0355  WBC 12.0* 10.2 7.6  NEUTROABS 9.8*  --   --   HGB 13.4 12.4* 11.2*  HCT 36.9* 33.9* 31.1*  MCV 79.2 78.8 80.2  PLT 211 206 211  Cardiac Enzymes:  Recent Labs  06/30/12 1721 06/30/12 2249 07/01/12 0450  TROPONINI 12.11* 10.24* 4.95*   Hemoglobin A1C:  Recent Labs  06/30/12 1721  HGBA1C 6.9*   Fasting Lipid Panel:  Recent Labs  07/01/12 0500  CHOL 255*  HDL 31*  LDLCALC 201*  TRIG 114  CHOLHDL 8.2   Thyroid Function Tests:  Recent Labs  06/30/12 1721  TSH 2.745    RADIOLOGY: Dg Chest Port 1 View  06/30/2012   *RADIOLOGY REPORT*  Clinical Data: Hypertension.  Diabetes.  Coronary artery disease. Shortness of breath.  PORTABLE CHEST - 1 VIEW  Comparison: 09/22/2011  Findings: Asymmetric airspace opacity is present throughout the right lung, but particularly confluent in the right lower lobe. Left lung appears clear.  Prior CABG noted.  Heart size within normal limits.  IMPRESSION:  1.  Asymmetric airspace opacity in the right lung, suspicious for pneumonia or pulmonary hemorrhage.  This involves at least the right upper lobe and right lower lobe.   Original Report Authenticated By: Gaylyn Rong, M.D.    PHYSICAL EXAM General: NAD Neck: JVP difficult, no thyromegaly or thyroid nodule.  Lungs: Rhonchi throughout lung fields bilaterally.  CV: PMI enlarged   Heart regular S1/S2, no S3/S4, no murmur. 1+ ankle edema.  No carotid bruit.  Normal pedal pulses.  Abdomen: Soft, nontender, no hepatosplenomegaly, no distention.  Neurologic:  Alert and oriented x 3.  Psych: Normal affect. Extremities: No clubbing or cyanosis.   TELEMETRY: Reviewed telemetry pt in NSR with PVCs  ASSESSMENT AND PLAN: 78 yo with history of CAD s/p CABG,  CHF, and CKD presented with dyspnea/hypoxemia  .  CXR concerning for PNA versus asymmetric edema, BNP 52K, Troponin peaked at 12, creatinine up to 2.7 now 2.56.  This morning breathing better.  1. Pulmonary: CXR with PNA versus asymmetric pulmonary edema.  WBCs not elevated today and not febrile.  He has been started on ceftriaxone/azithromycin to treat PNA.  Lung exam consistent with PNA with rhonchi.  BNP is quite high but exam difficult for volume (JVP does not appear markedly elevated).  He is on Lasix IV which I will continue for now.   2. Diabetes: Blood glucose now under better control.  IM consulted yesterday to help with management.  3. CAD: s/p CABG.  Last cath with 2/4 patent vein grafts.  Troponin to 12 this admission suggestive of NSTEMI .  His creatinine still elevated 2.48 .  Hold off on LHC for now and follow creatinine trend.   - Medical treatment for NSTEMI: Will given Plavix, ASA, continue heparin gtt, atorvastatin 80, Coreg.  4. Renal: Acute on chronic kidney injury.  Baseline creatinine 1.4.    Will need to follow closely while getting IV Lasix.  5. Acute on chronic CHF with EF 15% by echo Marked change .  Replete K will need right and left heart cath when renal improves  With chest pain last night and NSTEMI would not add milrinone at this  Time but consider in 48 hours if Cr not improving 6. HTN: Add back home amlodipine.  Holding ACEI with elevated creatinine.   [name] 07/02/2012 10:25 AM

## 2012-07-02 NOTE — Progress Notes (Signed)
6/25  CBGs on 6/24:  [phone] mg/dl   1/61  09/60- 89 mg/dl Recommend decreasing Novolog correction scale to MODERATE AC & HS if eating.  May need to decrease Lantus dose to 30 units daily if CBGs continue to be less than 70 mg/dl.  Smith Mince RN BSN CDE

## 2012-07-02 NOTE — Progress Notes (Signed)
ANTICOAGULATION CONSULT NOTE - Follow Up Consult  Pharmacy Consult:  Heparin Indication:  NSTEMI  Allergies  Allergen Reactions  . Niacin Itching    Patient Measurements: Height: 6' (182.9 cm) Weight: 203 lb 11.3 oz (92.4 kg) IBW/kg (Calculated) : 77.6 Heparin Dosing Weight: 90 kg   Vital Signs: Temp: 97.9 F (36.6 C) (06/25 1132) Temp src: Oral (06/25 1132) BP: 173/102 mmHg (06/25 1132) Pulse Rate: 82 (06/25 0834)  Labs:  Recent Labs  06/30/12 1245  06/30/12 1721 06/30/12 2249 07/01/12 0255 07/01/12 0450 07/01/12 0925 07/01/12 1245 07/02/12 0355  HGB 13.4  --   --   --  12.4*  --   --   --  11.2*  HCT 36.9*  --   --   --  33.9*  --   --   --  31.1*  PLT 211  --   --   --  206  --   --   --  211  HEPARINUNFRC  --   --   --   --  <0.10*  --   --  0.35 0.38  CREATININE 2.74*  --  2.59* 2.56*  --   --  2.56* 2.55* 2.48*  TROPONINI  --   < > 12.11* 10.24*  --  4.95*  --   --   --   < > = values in this interval not displayed.  Estimated Creatinine Clearance: 28.2 ml/min (by C-G formula based on Cr of 2.48).       Assessment: 41 YOM with NSTEMI to continue on IV heparin.  Heparin level therapeutic; no bleeding reported.  Noted no plan for cath now due to renal function and patient is asymptomatic.   Goal of Therapy:  Heparin level 0.3-0.7 units/ml Monitor platelets by anticoagulation protocol: Yes    Plan:  - Continue heparin gtt at 1550 units/hr - Daily HL / CBC - Consider changing azithromycin to PO     Joshua Vincent D. Laney Potash, PharmD, BCPS Pager:  [phone] 07/02/2012, 12:50 PM

## 2012-07-02 NOTE — Consult Note (Signed)
[facility] CONSULT f/u Note [place] TEAM 1 - Stepdown/ICU TEAM   ERAGON HAMMOND WUJ:[id number] DOB: 1934/05/23 DOA: 06/30/2012 PCP: Benita Stabile, MD  Brief narrative: 78 y.o.w/ PMHx CAD, HTN, DM (fairly well controlled per patient, last hemoglobin A1c =7)/current hemoglobin A1c= 6.9, and acute renal failure/chronic renal insufficiency (cr 1.12 December 2011) who reported feeling generally bad for 3 days. Per family, over 2 days he had eaten almost nothing and had not taken his medications.  The patient described some chest discomfort but stated it was only with breathing. He had some shortness of breath and dyspnea on exertion.  His symptoms worsened and he called EMS. He was admitted to the Cardiology service 6/23 for tx of acute on chronic diastolic CHF and non-ST elevation myocardial infarction.  [facility] was consulted on 6/23 to assist in management of hyperglycemia, acute on chronic renal failure, and a possible CAP.   Assessment/Plan:  Uncontrolled DM w/ Hyperglycemic Hyperosmolar State  CBG now well controlled - holding glucophage and glucotrol in setting of renal failure   Acute on chronic renal failure Baseline creatinine 1.4 - crt slowly improving - urinary retention may be playing a role - check renal US  Community acquired pneumonia WBCs not elevated today and not febrile - clinically the pt does not appear to have an active pulmonary infection - f/u CXR in AM and consider early d/c of abx if no compelling evidence of active infection  Intermittent urinary retention required placement of foely due to 450cc retained urine - per pt was a prior problem until he underwent TURP per Dr. Isabel Caprice - will f/u renal US - many need inpt Urology assist   Hypokalemia being tx by Cardiology  Acute on chronic CHF (congestive heart failure), NYHA class 4 As per Cardiology  HYPERTENSION  OSTEOARTHRITIS, SHOULDER, RIGHT No complaints at this time   OSA (obstructive sleep  apnea) Home CPAP regimen   Non-ST elevation myocardial infarction w/ hx of CAD: s/p CABG Per primary service (Cardiolgoy)  Family Communication: No family present at time of exam  Antibiotics: Azithromycin 6/23>> Ceftriaxone 6/23>>  DVT prophylaxis: IV heparin  HPI/Subjective: The patient is awake conversant and quite pleasant.  He denies chest pain shortness of breath fevers or chills.  He denies abdominal pain.  Objective: Blood pressure 150/88, pulse 82, temperature 98.9 F (37.2 C), temperature source Oral, resp. rate 17, height 6' (1.829 m), weight 92.4 kg (203 lb 11.3 oz), SpO2 99.00%.  Intake/Output Summary (Last 24 hours) at 07/02/12 1006 Last data filed at 07/02/12 0600  Gross per 24 hour  Intake   1658 ml  Output   2025 ml  Net   -367 ml    Exam: General: No acute respiratory distress Lungs: Mild bibasilar crackles with good air movement throughout other fields with no wheeze Cardiovascular: Regular rate and rhythm Abdomen: Nontender, nondistended, soft, bowel sounds positive, no rebound, no ascites, no appreciable mass Extremities: No significant cyanosis, clubbing, or edema bilateral lower extremities  Data Reviewed: Basic Metabolic Panel:  Recent Labs Lab 06/30/12 1721 06/30/12 2216 06/30/12 2249 07/01/12 0925 07/01/12 1245 07/02/12 0355  NA 132*  --  134* 138 138 137  K 2.9*  --  2.9* 3.6 3.5 3.0*  CL 93*  --  96 100 101 102  CO2 25  --  27 28 28 28   GLUCOSE 414*  --  295* 119* 142* 58*  BUN 46*  --  50* 48* 49* 47*  CREATININE 2.59*  --  2.56* 2.56* 2.55* 2.48*  CALCIUM 8.4  --  8.5 8.4 8.2* 8.1*  MG 2.1 2.1  --   --   --   --   PHOS  --   --  3.0  --   --   --    Liver Function Tests:  Recent Labs Lab 06/30/12 1245 06/30/12 2249  AST 29 30  ALT 15 16  ALKPHOS 103 94  BILITOT 0.8 0.4  PROT 6.4 5.9*  ALBUMIN 2.1* 2.0*   CBC:  Recent Labs Lab 06/30/12 1245 07/01/12 0255 07/02/12 0355  WBC 12.0* 10.2 7.6  NEUTROABS 9.8*  --    --   HGB 13.4 12.4* 11.2*  HCT 36.9* 33.9* 31.1*  MCV 79.2 78.8 80.2  PLT 211 206 211   Cardiac Enzymes:  Recent Labs Lab 06/30/12 1320 06/30/12 1721 06/30/12 2249 07/01/12 0450  TROPONINI 7.70* 12.11* 10.24* 4.95*   BNP (last 3 results)  Recent Labs  10/15/11 1229 12/19/11 1227 06/30/12 1320  PROBNP 276.0* 247.0* 52044.0*   CBG:  Recent Labs Lab 07/01/12 2011 07/01/12 2345 07/02/12 0410 07/02/12 0509 07/02/12 0827  GLUCAP 183* 100* 60* 98 89    Recent Results (from the past 240 hour(s))  CULTURE, BLOOD (ROUTINE X 2)     Status: None   Collection Time    06/30/12  3:50 PM      Result Value Range Status   Specimen Description BLOOD ARM LEFT   Final   Special Requests     Final   Value: BOTTLES DRAWN AEROBIC AND ANAEROBIC 10CC PATIENT ON FOLLOWING ZITHROMAX AND ROCEPHINE   Culture  Setup Time 06/30/2012 20:59   Final   Culture     Final   Value:        BLOOD CULTURE RECEIVED NO GROWTH TO DATE CULTURE WILL BE HELD FOR 5 DAYS BEFORE ISSUING A FINAL NEGATIVE REPORT   Report Status PENDING   Incomplete  CULTURE, BLOOD (ROUTINE X 2)     Status: None   Collection Time    06/30/12  4:10 PM      Result Value Range Status   Specimen Description BLOOD LEFT HAND   Final   Special Requests     Final   Value: BOTTLES DRAWN AEROBIC ONLY 5CC PATIENT ON FOLLOWING ZITHROMAX AND ROCEPHINE   Culture  Setup Time 06/30/2012 20:59   Final   Culture     Final   Value:        BLOOD CULTURE RECEIVED NO GROWTH TO DATE CULTURE WILL BE HELD FOR 5 DAYS BEFORE ISSUING A FINAL NEGATIVE REPORT   Report Status PENDING   Incomplete  MRSA PCR SCREENING     Status: None   Collection Time    06/30/12  4:45 PM      Result Value Range Status   MRSA by PCR NEGATIVE  NEGATIVE Final   Comment:            The GeneXpert MRSA Assay (FDA     approved for NASAL specimens     only), is one component of a     comprehensive MRSA colonization     surveillance program. It is not     intended to  diagnose MRSA     infection nor to guide or     monitor treatment for     MRSA infections.     Studies:  Recent x-ray studies have been reviewed in detail by the Attending Physician  Scheduled Meds:  Scheduled Meds: . amLODipine  10 mg Oral Daily  . aspirin EC  81 mg Oral Daily  . atorvastatin  80 mg Oral QHS  . azithromycin  500 mg Intravenous Q24H  . carvedilol  12.5 mg Oral BID WC  . cefTRIAXone (ROCEPHIN)  IV  1 g Intravenous Q24H  . clopidogrel  75 mg Oral Q breakfast  . feeding supplement  237 mL Oral Q24H  . furosemide  80 mg Intravenous BID  . glipiZIDE  10 mg Oral Q breakfast  . insulin aspart  0-20 Units Subcutaneous Q4H  . insulin aspart  10 Units Subcutaneous Once  . insulin glargine  35 Units Subcutaneous QHS  . isosorbide mononitrate  60 mg Oral Daily  . potassium chloride  20 mEq Oral Daily  . predniSONE  10 mg Oral Q breakfast  . predniSONE  20 mg Oral Q breakfast  . sodium chloride  3 mL Intravenous Q12H    Time spent on care of this patient:   [facility] T  [facility] Office  [phone] Pager - Text Page per Loretha Stapler as per below:  On-Call/Text Page:      [email or web address]      password [email or web address]  If 7PM-7AM, please contact night-coverage [email or web address] Password [email or web address] 07/02/2012, 10:06 AM   LOS: 2 days

## 2012-07-02 NOTE — Progress Notes (Signed)
Chaplain visited pt during normal daily rounds on Unit. Pt was awake, alert and responsive. Pt is recovering from a heart condition. Pt expressed significant progress in his health. He was very pleasant and optimistic about recovery. He benefits from strong family support and is impressed about the care he receives from [facility]. Chaplain empathically listened to his story and prayed for him at the bedside. Pt thanked chaplain for his visit and prayer. Kelle Darting [phone]  07/02/12 1638  Clinical Encounter Type  Visited With Patient  Visit Type Initial;Spiritual support  Spiritual Encounters  Spiritual Needs Prayer  Stress Factors  Patient Stress Factors None identified

## 2012-07-03 ENCOUNTER — Inpatient Hospital Stay (HOSPITAL_COMMUNITY): Payer: Medicare Other

## 2012-07-03 DIAGNOSIS — G4733 Obstructive sleep apnea (adult) (pediatric): Secondary | ICD-10-CM

## 2012-07-03 LAB — BASIC METABOLIC PANEL
BUN: 47 mg/dL — ABNORMAL HIGH (ref 6–23)
Chloride: 99 mEq/L (ref 96–112)
Creatinine, Ser: 2.14 mg/dL — ABNORMAL HIGH (ref 0.50–1.35)
GFR calc Af Amer: 33 mL/min — ABNORMAL LOW (ref >90)
Glucose, Bld: 272 mg/dL — ABNORMAL HIGH (ref 70–99)

## 2012-07-03 LAB — CBC
HCT: 32.1 % — ABNORMAL LOW (ref 39.0–52.0)
Hemoglobin: 11.3 g/dL — ABNORMAL LOW (ref 13.0–17.0)
MCH: 28.5 pg (ref 26.0–34.0)
MCHC: 35.2 g/dL (ref 30.0–36.0)
MCV: 81.1 fL (ref 78.0–100.0)
RDW: 13.6 % (ref 11.5–15.5)

## 2012-07-03 LAB — GLUCOSE, CAPILLARY
Glucose-Capillary: 122 mg/dL — ABNORMAL HIGH (ref 70–99)
Glucose-Capillary: 171 mg/dL — ABNORMAL HIGH (ref 70–99)

## 2012-07-03 MED ORDER — MILRINONE IN DEXTROSE 20 MG/100ML IV SOLN
0.2500 ug/kg/min | INTRAVENOUS | Status: DC
  Administered 2012-07-03 – 2012-07-07 (×8): 0.25 ug/kg/min via INTRAVENOUS
  Filled 2012-07-03 (×8): qty 100

## 2012-07-03 NOTE — Consult Note (Signed)
[facility] CONSULT f/u Note [place] TEAM 1 - Stepdown/ICU TEAM   MAJD TISSUE ZOX:[id number] DOB: September 10, 1934 DOA: 06/30/2012 PCP: Benita Stabile, MD  Brief narrative: 78 y.o.w/ PMHx CAD, HTN, DM (fairly well controlled per patient, last hemoglobin A1c =7)/current hemoglobin A1c= 6.9, and acute renal failure/chronic renal insufficiency (cr 1.12 December 2011) who reported feeling generally bad for 3 days. Per family, over 2 days he had eaten almost nothing and had not taken his medications.  The patient described some chest discomfort but stated it was only with breathing. He had some shortness of breath and dyspnea on exertion.  His symptoms worsened and he called EMS. He was admitted to the Cardiology service 6/23 for tx of acute on chronic diastolic CHF and non-ST elevation myocardial infarction.  [facility] was consulted on 6/23 to assist in management of hyperglycemia, acute on chronic renal failure, and a possible CAP.   Assessment/Plan:  Uncontrolled DM w/ Hyperglycemic Hyperosmolar State  CBG now well controlled - holding glucophage and glucotrol in setting of renal failure   Acute on chronic renal failure Baseline creatinine 1.4 - crt slowly improving - urinary retention may be playing a role - check renal US  Community acquired pneumonia WBCs not elevated today and not febrile - clinically the pt does not appear to have an active pulmonary infection - will d/c antibiotics today  Intermittent urinary retention required placement of foely due to 450cc retained urine - per pt was a prior problem until he underwent TURP per Dr. Isabel Caprice - will f/u renal US - many need inpt Urology assist   Hypokalemia being tx by Cardiology  Acute on chronic CHF (congestive heart failure), NYHA class 4 As per Cardiology-   HYPERTENSION  OSTEOARTHRITIS, SHOULDER, RIGHT No complaints at this time   OSA (obstructive sleep apnea) Home CPAP regimen   Non-ST elevation myocardial  infarction w/ hx of CAD: s/p CABG Per primary service (Cardiolgoy)  Family Communication: No family present at time of exam  Antibiotics: Azithromycin 6/23>> Ceftriaxone 6/23>>  DVT prophylaxis: IV heparin  HPI/Subjective: The patient is awake conversant and quite pleasant.  No complaints of cough.  Objective: Blood pressure 125/70, pulse 79, temperature 98.3 F (36.8 C), temperature source Oral, resp. rate 22, height 6' (1.829 m), weight 93.5 kg (206 lb 2.1 oz), SpO2 90.00%.  Intake/Output Summary (Last 24 hours) at 07/03/12 1414 Last data filed at 07/03/12 1400  Gross per 24 hour  Intake   1011 ml  Output   3100 ml  Net  -2089 ml    Exam: General: No acute respiratory distress Lungs: Mild bibasilar crackles with good air movement throughout other fields with no wheeze Cardiovascular: Regular rate and rhythm Abdomen: Nontender, nondistended, soft, bowel sounds positive, no rebound, no ascites, no appreciable mass Extremities: No significant cyanosis, clubbing, or edema bilateral lower extremities  Data Reviewed: Basic Metabolic Panel:  Recent Labs Lab 06/30/12 1721 06/30/12 2216 06/30/12 2249 07/01/12 0925 07/01/12 1245 07/02/12 0355 07/03/12 0358  NA 132*  --  134* 138 138 137 134*  K 2.9*  --  2.9* 3.6 3.5 3.0* 3.9  CL 93*  --  96 100 101 102 99  CO2 25  --  27 28 28 28 26   GLUCOSE 414*  --  295* 119* 142* 58* 272*  BUN 46*  --  50* 48* 49* 47* 47*  CREATININE 2.59*  --  2.56* 2.56* 2.55* 2.48* 2.14*  CALCIUM 8.4  --  8.5 8.4 8.2* 8.1* 8.2*  MG 2.1 2.1  --   --   --   --   --   PHOS  --   --  3.0  --   --   --   --    Liver Function Tests:  Recent Labs Lab 06/30/12 1245 06/30/12 2249  AST 29 30  ALT 15 16  ALKPHOS 103 94  BILITOT 0.8 0.4  PROT 6.4 5.9*  ALBUMIN 2.1* 2.0*   CBC:  Recent Labs Lab 06/30/12 1245 07/01/12 0255 07/02/12 0355 07/03/12 0358  WBC 12.0* 10.2 7.6 8.2  NEUTROABS 9.8*  --   --   --   HGB 13.4 12.4* 11.2* 11.3*   HCT 36.9* 33.9* 31.1* 32.1*  MCV 79.2 78.8 80.2 81.1  PLT 211 206 211 230   Cardiac Enzymes:  Recent Labs Lab 06/30/12 1320 06/30/12 1721 06/30/12 2249 07/01/12 0450  TROPONINI 7.70* 12.11* 10.24* 4.95*   BNP (last 3 results)  Recent Labs  10/15/11 1229 12/19/11 1227 06/30/12 1320  PROBNP 276.0* 247.0* 52044.0*   CBG:  Recent Labs Lab 07/02/12 1220 07/02/12 1705 07/02/12 2129 07/03/12 0754 07/03/12 1204  GLUCAP 151* 263* 360* 148* 122*    Recent Results (from the past 240 hour(s))  CULTURE, BLOOD (ROUTINE X 2)     Status: None   Collection Time    06/30/12  3:50 PM      Result Value Range Status   Specimen Description BLOOD ARM LEFT   Final   Special Requests     Final   Value: BOTTLES DRAWN AEROBIC AND ANAEROBIC 10CC PATIENT ON FOLLOWING ZITHROMAX AND ROCEPHINE   Culture  Setup Time 06/30/2012 20:59   Final   Culture     Final   Value:        BLOOD CULTURE RECEIVED NO GROWTH TO DATE CULTURE WILL BE HELD FOR 5 DAYS BEFORE ISSUING A FINAL NEGATIVE REPORT   Report Status PENDING   Incomplete  CULTURE, BLOOD (ROUTINE X 2)     Status: None   Collection Time    06/30/12  4:10 PM      Result Value Range Status   Specimen Description BLOOD LEFT HAND   Final   Special Requests     Final   Value: BOTTLES DRAWN AEROBIC ONLY 5CC PATIENT ON FOLLOWING ZITHROMAX AND ROCEPHINE   Culture  Setup Time 06/30/2012 20:59   Final   Culture     Final   Value:        BLOOD CULTURE RECEIVED NO GROWTH TO DATE CULTURE WILL BE HELD FOR 5 DAYS BEFORE ISSUING A FINAL NEGATIVE REPORT   Report Status PENDING   Incomplete  MRSA PCR SCREENING     Status: None   Collection Time    06/30/12  4:45 PM      Result Value Range Status   MRSA by PCR NEGATIVE  NEGATIVE Final   Comment:            The GeneXpert MRSA Assay (FDA     approved for NASAL specimens     only), is one component of a     comprehensive MRSA colonization     surveillance program. It is not     intended to diagnose  MRSA     infection nor to guide or     monitor treatment for     MRSA infections.     Studies:  Recent x-ray studies have been reviewed in detail by the Attending Physician  Scheduled Meds:  Scheduled Meds: . amLODipine  10 mg Oral Daily  . aspirin EC  81 mg Oral Daily  . atorvastatin  80 mg Oral QHS  . azithromycin  500 mg Intravenous Q24H  . carvedilol  12.5 mg Oral BID WC  . cefTRIAXone (ROCEPHIN)  IV  1 g Intravenous Q24H  . clopidogrel  75 mg Oral Q breakfast  . feeding supplement  237 mL Oral Q24H  . furosemide  80 mg Intravenous BID  . insulin aspart  0-15 Units Subcutaneous TID WC  . insulin aspart  0-5 Units Subcutaneous QHS  . insulin glargine  28 Units Subcutaneous QHS  . isosorbide mononitrate  60 mg Oral Daily  . potassium chloride  40 mEq Oral BID  . sodium chloride  3 mL Intravenous Q12H    Time spent on care of this patient:   [facility]  [facility] Office  [phone] Pager - Text Page per Loretha Stapler as per below:  On-Call/Text Page:      [email or web address]      password [email or web address]  If 7PM-7AM, please contact night-coverage [email or web address] Password [email or web address] 07/03/2012, 2:14 PM   LOS: 3 days

## 2012-07-03 NOTE — Progress Notes (Signed)
ANTICOAGULATION CONSULT NOTE - Follow Up Consult  Pharmacy Consult:  Heparin Indication:  NSTEMI  Allergies  Allergen Reactions  . Niacin Itching   Patient Measurements: Height: 6' (182.9 cm) Weight: 206 lb 2.1 oz (93.5 kg) IBW/kg (Calculated) : 77.6 Heparin Dosing Weight: 90 kg   Vital Signs: Temp: 98.8 F (37.1 C) (06/26 0758) Temp src: Oral (06/26 0758) BP: 159/94 mmHg (06/26 0758)  Labs:  Recent Labs  06/30/12 1721 06/30/12 2249  07/01/12 0255 07/01/12 0450  07/01/12 1245 07/02/12 0355 07/03/12 0358  HGB  --   --   --  12.4*  --   --   --  11.2* 11.3*  HCT  --   --   --  33.9*  --   --   --  31.1* 32.1*  PLT  --   --   --  206  --   --   --  211 230  HEPARINUNFRC  --   --   < > <0.10*  --   --  0.35 0.38 0.32  CREATININE 2.59* 2.56*  --   --   --   < > 2.55* 2.48* 2.14*  TROPONINI 12.11* 10.24*  --   --  4.95*  --   --   --   --   < > = values in this interval not displayed.  Estimated Creatinine Clearance: 35.4 ml/min (by C-G formula based on Cr of 2.14).  Assessment: 47 YOM with NSTEMI to continue on IV heparin.  Heparin level therapeutic at 0.32 and an IV rate of 1550 units/hr.  He has no bleeding reported.  Noted no plan for cath now due to renal function.  His H/H is stable this morning as well as his platelets.  Goal of Therapy:  Heparin level 0.3-0.7 units/ml Monitor platelets by anticoagulation protocol: Yes   Plan:  - Continue heparin gtt at 1550 units/hr - Daily HL / CBC - Consider changing azithromycin to PO  Nadara Mustard, PharmD., MS Clinical Pharmacist Pager:  [phone] Thank you for allowing pharmacy to be part of this patients care team. 07/03/2012, 8:17 AM

## 2012-07-03 NOTE — Progress Notes (Signed)
Patient ID: Joshua Vincent, male   DOB: 06-12-1934, 78 y.o.   MRN: [id number]    SUBJECTIVE: Joshua Vincent was admitted  with dyspnea/hypoxemia.  He had been off some of his medications for over a week (not sure which).  He has had trouble getting all his medications in the past.   Severe chest pain 07/31/12  with evolving elevation in troponin but no acute ECG changes. Seen by Dr Joshua Vincent and cath deferred  Still with significant dyspnea PND and orthopnea.  Cr improving   . amLODipine  10 mg Oral Daily  . aspirin EC  81 mg Oral Daily  . atorvastatin  80 mg Oral QHS  . azithromycin  500 mg Intravenous Q24H  . carvedilol  12.5 mg Oral BID WC  . cefTRIAXone (ROCEPHIN)  IV  1 g Intravenous Q24H  . clopidogrel  75 mg Oral Q breakfast  . feeding supplement  237 mL Oral Q24H  . furosemide  80 mg Intravenous BID  . insulin aspart  0-15 Units Subcutaneous TID WC  . insulin aspart  0-5 Units Subcutaneous QHS  . insulin glargine  28 Units Subcutaneous QHS  . isosorbide mononitrate  60 mg Oral Daily  . potassium chloride  40 mEq Oral BID  . sodium chloride  3 mL Intravenous Q12H  heparin gtt    Filed Vitals:   07/03/12 0000 07/03/12 0300 07/03/12 0400 07/03/12 0657  BP: 158/86  161/87   Pulse:      Temp: 98 F (36.7 C)  98.5 F (36.9 C)   TempSrc: Oral  Oral   Resp:      Height:      Weight:  206 lb 2.1 oz (93.5 kg)    SpO2:    97%    Intake/Output Summary (Last 24 hours) at 07/03/12 0728 Last data filed at 07/03/12 0600  Gross per 24 hour  Intake 1457.95 ml  Output   2875 ml  Net -1417.05 ml    LABS: Basic Metabolic Panel:  Recent Labs  65/78/46 1721 06/30/12 2216 06/30/12 2249  07/02/12 0355 07/03/12 0358  NA 132*  --  134*  < > 137 134*  K 2.9*  --  2.9*  < > 3.0* 3.9  CL 93*  --  96  < > 102 99  CO2 25  --  27  < > 28 26  GLUCOSE 414*  --  295*  < > 58* 272*  BUN 46*  --  50*  < > 47* 47*  CREATININE 2.59*  --  2.56*  < > 2.48* 2.14*  CALCIUM 8.4  --  8.5  < > 8.1*  8.2*  MG 2.1 2.1  --   --   --   --   PHOS  --   --  3.0  --   --   --   < > = values in this interval not displayed. Liver Function Tests:  Recent Labs  06/30/12 1245 06/30/12 2249  AST 29 30  ALT 15 16  ALKPHOS 103 94  BILITOT 0.8 0.4  PROT 6.4 5.9*  ALBUMIN 2.1* 2.0*   CBC:  Recent Labs  06/30/12 1245  07/02/12 0355 07/03/12 0358  WBC 12.0*  < > 7.6 8.2  NEUTROABS 9.8*  --   --   --   HGB 13.4  < > 11.2* 11.3*  HCT 36.9*  < > 31.1* 32.1*  MCV 79.2  < > 80.2 81.1  PLT 211  < >  211 230  < > = values in this interval not displayed. Cardiac Enzymes:  Recent Labs  06/30/12 1721 06/30/12 2249 07/01/12 0450  TROPONINI 12.11* 10.24* 4.95*   Hemoglobin A1C:  Recent Labs  06/30/12 1721  HGBA1C 6.9*   Fasting Lipid Panel:  Recent Labs  07/01/12 0500  CHOL 255*  HDL 31*  LDLCALC 201*  TRIG 114  CHOLHDL 8.2   Thyroid Function Tests:  Recent Labs  06/30/12 1721  TSH 2.745    RADIOLOGY: Dg Chest Port 1 View  06/30/2012   *RADIOLOGY REPORT*  Clinical Data: Hypertension.  Diabetes.  Coronary artery disease. Shortness of breath.  PORTABLE CHEST - 1 VIEW  Comparison: 09/22/2011  Findings: Asymmetric airspace opacity is present throughout the right lung, but particularly confluent in the right lower lobe. Left lung appears clear.  Prior CABG noted.  Heart size within normal limits.  IMPRESSION:  1.  Asymmetric airspace opacity in the right lung, suspicious for pneumonia or pulmonary hemorrhage.  This involves at least the right upper lobe and right lower lobe.   Original Report Authenticated By: Joshua Vincent, M.D.    PHYSICAL EXAM General: NAD Neck: JVP difficult, no thyromegaly or thyroid nodule.  Lungs: Rhonchi throughout lung fields bilaterally.  CV: PMI enlarged   Heart regular S1/S2, no S3/S4, no murmur. 1+ ankle edema.  No carotid bruit.  Normal pedal pulses.  Abdomen: Soft, nontender, no hepatosplenomegaly, no distention.  Neurologic: Alert and  oriented x 3.  Psych: Normal affect. Extremities: No clubbing or cyanosis.   TELEMETRY: Reviewed telemetry pt in NSR with PVCs  ASSESSMENT AND PLAN: CHF:  In setting of previous CABG and EF now 15% with SEMI  ECG with no acute changes RBBB.  Evaluated for cath by Dr Joshua Vincent 6/24 and deferred due to ARF Cr now improving 2.1 but still with clinically high filling pressures and dyspnea. Continue heparin , asa plavix and iv nitrates 1.5 L diuresis with bid iv lasix.  Add low dose milrinone now that out 72 hours from SEMI.  Prognosis still guarded Check CXR this am.  K improved with supplementation  Joshua Vincent 07/03/2012 7:28 AM

## 2012-07-03 NOTE — Progress Notes (Signed)
Inpatient Diabetes Program Recommendations  AACE/ADA: New Consensus Statement on Inpatient Glycemic Control (2013)  Target Ranges:  Prepandial:   less than 140 mg/dL      Peak postprandial:   less than 180 mg/dL (1-2 hours)      Critically ill patients:  140 - 180 mg/dL   Inpatient Diabetes Program Recommendations Correction (SSI): . Insulin - Meal Coverage: consider adding Novolog 3 units TID with meals  Thank you  Piedad Climes BSN, RN,CDE Inpatient Diabetes Coordinator [phone] (team pager)

## 2012-07-04 LAB — BASIC METABOLIC PANEL
CO2: 26 mEq/L (ref 19–32)
Calcium: 8.3 mg/dL — ABNORMAL LOW (ref 8.4–10.5)
Glucose, Bld: 139 mg/dL — ABNORMAL HIGH (ref 70–99)
Potassium: 3.9 mEq/L (ref 3.5–5.1)
Sodium: 138 mEq/L (ref 135–145)

## 2012-07-04 LAB — GLUCOSE, CAPILLARY
Glucose-Capillary: 198 mg/dL — ABNORMAL HIGH (ref 70–99)
Glucose-Capillary: 93 mg/dL (ref 70–99)
Glucose-Capillary: 177 mg/dL — ABNORMAL HIGH (ref 70–99)
Glucose-Capillary: 278 mg/dL — ABNORMAL HIGH (ref 70–99)
Glucose-Capillary: 260 mg/dL — ABNORMAL HIGH (ref 70–99)

## 2012-07-04 LAB — MAGNESIUM: Magnesium: 2 mg/dL (ref 1.5–2.5)

## 2012-07-04 LAB — CBC
HCT: 30.9 % — ABNORMAL LOW (ref 39.0–52.0)
Hemoglobin: 11 g/dL — ABNORMAL LOW (ref 13.0–17.0)
RBC: 3.84 MIL/uL — ABNORMAL LOW (ref 4.22–5.81)
WBC: 9.2 10*3/uL (ref 4.0–10.5)

## 2012-07-04 LAB — HEPARIN LEVEL (UNFRACTIONATED)
Heparin Unfractionated: 0.21 IU/mL — ABNORMAL LOW (ref 0.30–0.70)
Heparin Unfractionated: 0.47 [IU]/mL (ref 0.30–0.70)

## 2012-07-04 MED ORDER — CARVEDILOL 12.5 MG PO TABS
12.5000 mg | ORAL_TABLET | Freq: Two times a day (BID) | ORAL | Status: DC
  Administered 2012-07-04 – 2012-07-07 (×8): 12.5 mg via ORAL
  Filled 2012-07-04 (×10): qty 1

## 2012-07-04 MED ORDER — INSULIN GLARGINE 100 UNIT/ML ~~LOC~~ SOLN
30.0000 [IU] | Freq: Every day | SUBCUTANEOUS | Status: DC
  Administered 2012-07-04 – 2012-07-05 (×2): 30 [IU] via SUBCUTANEOUS
  Filled 2012-07-04 (×3): qty 0.3

## 2012-07-04 NOTE — Progress Notes (Signed)
ANTICOAGULATION CONSULT NOTE - Follow Up Consult  Pharmacy Consult:  Heparin Indication:  NSTEMI  Allergies  Allergen Reactions  . Niacin Itching   Patient Measurements: Height: 6' (182.9 cm) Weight: 207 lb 10.8 oz (94.2 kg) IBW/kg (Calculated) : 77.6 Heparin Dosing Weight: 90 kg   Vital Signs: Temp: 97.9 F (36.6 C) (06/27 0800) Temp src: Oral (06/27 0800) BP: 160/75 mmHg (06/27 1000) Pulse Rate: 89 (06/27 1000)  Labs:  Recent Labs  07/02/12 0355 07/03/12 0358 07/04/12 0410 07/04/12 1130  HGB 11.2* 11.3* 11.0*  --   HCT 31.1* 32.1* 30.9*  --   PLT 211 230 232  --   HEPARINUNFRC 0.38 0.32 0.21* 0.20*  CREATININE 2.48* 2.14* 2.12*  --     Estimated Creatinine Clearance: 35.9 ml/min (by C-G formula based on Cr of 2.12).  Assessment: 89 YOM with NSTEMI to continue on IV heparin.  Heparin level still low despite rate increase last evening to 1700 units/hr.  He has no bleeding reported.  Noted undecided plan for cath now due to renal function.  His H/H is stable this morning as well as his platelets.  Goal of Therapy:  Heparin level 0.3-0.7 units/ml Monitor platelets by anticoagulation protocol: Yes   Plan:  - Increase IV heparin gtt to 1850 units/hr - Check 8 hour  HL / CBC  Nadara Mustard, PharmD., MS Clinical Pharmacist Pager:  [phone] Thank you for allowing pharmacy to be part of this patients care team. 07/04/2012, 1:42 PM

## 2012-07-04 NOTE — Progress Notes (Signed)
ANTICOAGULATION CONSULT NOTE - Follow Up Consult  Pharmacy Consult:  Heparin Indication:  NSTEMI  Allergies  Allergen Reactions  . Niacin Itching   Patient Measurements: Height: 6' (182.9 cm) Weight: 207 lb 10.8 oz (94.2 kg) IBW/kg (Calculated) : 77.6 Heparin Dosing Weight: 90 kg   Vital Signs: Temp: 98.3 F (36.8 C) (06/27 2300) Temp src: Oral (06/27 2300) BP: 166/87 mmHg (06/27 2300) Pulse Rate: 73 (06/27 2000)  Labs:  Recent Labs  07/02/12 0355 07/03/12 0358 07/04/12 0410 07/04/12 1130 07/04/12 2220  HGB 11.2* 11.3* 11.0*  --   --   HCT 31.1* 32.1* 30.9*  --   --   PLT 211 230 232  --   --   HEPARINUNFRC 0.38 0.32 0.21* 0.20* 0.47  CREATININE 2.48* 2.14* 2.12*  --   --     Estimated Creatinine Clearance: 35.9 ml/min (by C-G formula based on Cr of 2.12).  Assessment: 73 YOM with NSTEMI to continue on IV heparin. Heparin level (0.47) is at-goal on 1850 units/hr.   Goal of Therapy:  Heparin level 0.3-0.7 units/ml Monitor platelets by anticoagulation protocol: Yes   Plan:  1. Continue IV heparin at 1850 units/hr.  2. Follow-up CBC, HL in AM   07/04/2012, 11:54 PM

## 2012-07-04 NOTE — Consult Note (Signed)
[facility] CONSULT f/u Note [place] TEAM 1 - Stepdown/ICU TEAM   CHETAN MEHRING EAV:[id number] DOB: 07-25-1934 DOA: 06/30/2012 PCP: Benita Stabile, MD  Brief narrative: 78 y.o.w/ PMHx CAD, HTN, DM (fairly well controlled per patient, last hemoglobin A1c =7)/current hemoglobin A1c= 6.9, and acute renal failure/chronic renal insufficiency (cr 1.12 December 2011) who reported feeling generally bad for 3 days. Per family, over 2 days he had eaten almost nothing and had not taken his medications.  The patient described some chest discomfort but stated it was only with breathing. He had some shortness of breath and dyspnea on exertion.  His symptoms worsened and he called EMS. He was admitted to the Cardiology service 6/23 for tx of acute on chronic diastolic CHF and non-ST elevation myocardial infarction.  TRH was consulted on 6/23 to assist in management of hyperglycemia, acute on chronic renal failure, and a possible CAP.   Assessment/Plan:  Uncontrolled DM w/ Hyperglycemic Hyperosmolar State  CBG controlled - holding glucophage and glucotrol in setting of renal failure and possible need for cath soon  Acute on chronic renal failure Baseline creatinine 1.4 - crt slowly improving - renal US revealed no hydro  Community acquired pneumonia? - doubt  WBCs not elevated and pt has not been febrile - clinically the pt did not appear to have an active pulmonary infection - stable s/p d/c of antibiotics  Intermittent urinary retention required placement of foley due to 450cc retained urine - per pt was a prior problem until he underwent TURP per Dr. Isabel Caprice - renal US unrevealing - foley to remain at this time in setting of acute renal failure - voiding trials once crt improves further   Hypokalemia - resolved tx by Cardiology  Acute on chronic CHF (congestive heart failure), NYHA class 4 As per Cardiology   HYPERTENSION As per Cardiology  OSTEOARTHRITIS, SHOULDER, RIGHT No  complaints at this time   OSA (obstructive sleep apnea) Home CPAP regimen   Non-ST elevation myocardial infarction w/ hx of CAD: s/p CABG Per primary service (Cardiolgoy)  Family Communication: No family present at time of exam  Antibiotics: Azithromycin 6/23>>6/25 Ceftriaxone 6/23>>6/26  DVT prophylaxis: IV heparin  HPI/Subjective: The patient is awake conversant and quite pleasant.  He denies nausea vomiting or abdominal pain.  Objective: Blood pressure 128/95, pulse 80, temperature 97.9 F (36.6 C), temperature source Oral, resp. rate 25, height 6' (1.829 m), weight 94.2 kg (207 lb 10.8 oz), SpO2 95.00%.  Intake/Output Summary (Last 24 hours) at 07/04/12 0927 Last data filed at 07/04/12 0600  Gross per 24 hour  Intake 1094.68 ml  Output   2625 ml  Net -1530.32 ml    Exam: General: No acute respiratory distress Lungs: Mild bibasilar crackles with good air movement throughout other fields with no wheeze Cardiovascular: Regular rate and rhythm Abdomen: Nontender, nondistended, soft, bowel sounds positive, no rebound, no ascites, no appreciable mass Extremities: No significant cyanosis, clubbing bilateral lower extremities  Data Reviewed: Basic Metabolic Panel:  Recent Labs Lab 06/30/12 1721 06/30/12 2216 06/30/12 2249 07/01/12 0925 07/01/12 1245 07/02/12 0355 07/03/12 0358 07/04/12 0410  NA 132*  --  134* 138 138 137 134* 138  K 2.9*  --  2.9* 3.6 3.5 3.0* 3.9 3.9  CL 93*  --  96 100 101 102 99 103  CO2 25  --  27 28 28 28 26 26   GLUCOSE 414*  --  295* 119* 142* 58* 272* 139*  BUN 46*  --  50* 48*  49* 47* 47* 41*  CREATININE 2.59*  --  2.56* 2.56* 2.55* 2.48* 2.14* 2.12*  CALCIUM 8.4  --  8.5 8.4 8.2* 8.1* 8.2* 8.3*  MG 2.1 2.1  --   --   --   --   --  2.0  PHOS  --   --  3.0  --   --   --   --   --    Liver Function Tests:  Recent Labs Lab 06/30/12 1245 06/30/12 2249  AST 29 30  ALT 15 16  ALKPHOS 103 94  BILITOT 0.8 0.4  PROT 6.4 5.9*   ALBUMIN 2.1* 2.0*   CBC:  Recent Labs Lab 06/30/12 1245 07/01/12 0255 07/02/12 0355 07/03/12 0358 07/04/12 0410  WBC 12.0* 10.2 7.6 8.2 9.2  NEUTROABS 9.8*  --   --   --   --   HGB 13.4 12.4* 11.2* 11.3* 11.0*  HCT 36.9* 33.9* 31.1* 32.1* 30.9*  MCV 79.2 78.8 80.2 81.1 80.5  PLT 211 206 211 230 232   Cardiac Enzymes:  Recent Labs Lab 06/30/12 1320 06/30/12 1721 06/30/12 2249 07/01/12 0450  TROPONINI 7.70* 12.11* 10.24* 4.95*   BNP (last 3 results)  Recent Labs  10/15/11 1229 12/19/11 1227 06/30/12 1320  PROBNP 276.0* 247.0* 52044.0*   CBG:  Recent Labs Lab 07/03/12 0754 07/03/12 1204 07/03/12 1651 07/03/12 2146 07/04/12 0741  GLUCAP 148* 122* 171* 198* 93    Recent Results (from the past 240 hour(s))  CULTURE, BLOOD (ROUTINE X 2)     Status: None   Collection Time    06/30/12  3:50 PM      Result Value Range Status   Specimen Description BLOOD ARM LEFT   Final   Special Requests     Final   Value: BOTTLES DRAWN AEROBIC AND ANAEROBIC 10CC PATIENT ON FOLLOWING ZITHROMAX AND ROCEPHINE   Culture  Setup Time 06/30/2012 20:59   Final   Culture     Final   Value:        BLOOD CULTURE RECEIVED NO GROWTH TO DATE CULTURE WILL BE HELD FOR 5 DAYS BEFORE ISSUING A FINAL NEGATIVE REPORT   Report Status PENDING   Incomplete  CULTURE, BLOOD (ROUTINE X 2)     Status: None   Collection Time    06/30/12  4:10 PM      Result Value Range Status   Specimen Description BLOOD LEFT HAND   Final   Special Requests     Final   Value: BOTTLES DRAWN AEROBIC ONLY 5CC PATIENT ON FOLLOWING ZITHROMAX AND ROCEPHINE   Culture  Setup Time 06/30/2012 20:59   Final   Culture     Final   Value:        BLOOD CULTURE RECEIVED NO GROWTH TO DATE CULTURE WILL BE HELD FOR 5 DAYS BEFORE ISSUING A FINAL NEGATIVE REPORT   Report Status PENDING   Incomplete  MRSA PCR SCREENING     Status: None   Collection Time    06/30/12  4:45 PM      Result Value Range Status   MRSA by PCR NEGATIVE   NEGATIVE Final   Comment:            The GeneXpert MRSA Assay (FDA     approved for NASAL specimens     only), is one component of a     comprehensive MRSA colonization     surveillance program. It is not     intended to diagnose MRSA  infection nor to guide or     monitor treatment for     MRSA infections.     Studies:  Recent x-ray studies have been reviewed in detail by the Attending Physician  Scheduled Meds:  Scheduled Meds: . amLODipine  10 mg Oral Daily  . aspirin EC  81 mg Oral Daily  . atorvastatin  80 mg Oral QHS  . carvedilol  12.5 mg Oral BID WC  . clopidogrel  75 mg Oral Q breakfast  . feeding supplement  237 mL Oral Q24H  . furosemide  80 mg Intravenous BID  . insulin aspart  0-15 Units Subcutaneous TID WC  . insulin aspart  0-5 Units Subcutaneous QHS  . insulin glargine  28 Units Subcutaneous QHS  . isosorbide mononitrate  60 mg Oral Daily  . potassium chloride  40 mEq Oral BID  . sodium chloride  3 mL Intravenous Q12H    Time spent on care of this patient:   Lonia Blood  [facility] Office  [phone] Pager - Text Page per Loretha Stapler as per below:  On-Call/Text Page:      [email or web address]      password [email or web address]  If 7PM-7AM, please contact night-coverage [email or web address] Password [facility] 07/04/2012, 9:27 AM   LOS: 4 days

## 2012-07-04 NOTE — Progress Notes (Signed)
MD made aware of ventricular trigeminy runs and frequent multifocal PVC's. Strips saved and placed in chart. Will continue to monitor pt. VSS.

## 2012-07-04 NOTE — Progress Notes (Signed)
[facility] cardiology paged in regards to pt rhythm changes. Pt has had an increase in runs of ventricular trigeminy, and frequent multifocal PVC's post coreg administration. MD order add on Mag to Labs. Will continue to monitor closely.

## 2012-07-04 NOTE — Progress Notes (Signed)
ANTICOAGULATION CONSULT NOTE  Pharmacy Consult:  Heparin Indication:  NSTEMI  Allergies  Allergen Reactions  . Niacin Itching   Patient Measurements: Height: 6' (182.9 cm) Weight: 207 lb 10.8 oz (94.2 kg) IBW/kg (Calculated) : 77.6 Heparin Dosing Weight: 90 kg   Vital Signs: Temp: 98.5 F (36.9 C) (06/27 0000) Temp src: Oral (06/27 0000) BP: 163/67 mmHg (06/27 0200) Pulse Rate: 84 (06/27 0200)  Labs:  Recent Labs  07/01/12 1245  07/02/12 0355 07/03/12 0358 07/04/12 0410  HGB  --   < > 11.2* 11.3* 11.0*  HCT  --   --  31.1* 32.1* 30.9*  PLT  --   --  211 230 232  HEPARINUNFRC 0.35  --  0.38 0.32 0.21*  CREATININE 2.55*  --  2.48* 2.14*  --   < > = values in this interval not displayed.  Estimated Creatinine Clearance: 35.5 ml/min (by C-G formula based on Cr of 2.14).  Assessment: 78 YO Male with NSTEMI for heparin.    Goal of Therapy:  Heparin level 0.3-0.7 units/ml Monitor platelets by anticoagulation protocol: Yes   Plan:  Increase Heparin 1700 units/hr Check heparin level in 8 hours.  Geannie Risen, PharmD, BCPS  07/04/2012, 4:51 AM

## 2012-07-04 NOTE — Progress Notes (Addendum)
MD re-paged regarding pt's more frequent runs of ventricular trigeminy. MD ordered am Coreg to be given now. Will continue to monitor.

## 2012-07-04 NOTE — Progress Notes (Signed)
Patient ID: Joshua Vincent, male   DOB: [birth year], 78 y.o.   MRN: [id number]    SUBJECTIVE: Much better able to sleep last night No chest pain    . amLODipine  10 mg Oral Daily  . aspirin EC  81 mg Oral Daily  . atorvastatin  80 mg Oral QHS  . carvedilol  12.5 mg Oral BID WC  . clopidogrel  75 mg Oral Q breakfast  . feeding supplement  237 mL Oral Q24H  . furosemide  80 mg Intravenous BID  . insulin aspart  0-15 Units Subcutaneous TID WC  . insulin aspart  0-5 Units Subcutaneous QHS  . insulin glargine  28 Units Subcutaneous QHS  . isosorbide mononitrate  60 mg Oral Daily  . potassium chloride  40 mEq Oral BID  . sodium chloride  3 mL Intravenous Q12H  heparin gtt    Filed Vitals:   07/04/12 0500 07/04/12 0600 07/04/12 0636 07/04/12 0800  BP: 131/65 117/45 143/88 128/95  Pulse: 76 84 73 80  Temp:    97.9 F (36.6 C)  TempSrc:    Oral  Resp: 14 25 17 25   Height:      Weight:      SpO2: 96% 95% 95% 95%    Intake/Output Summary (Last 24 hours) at 07/04/12 0849 Last data filed at 07/04/12 0600  Gross per 24 hour  Intake 1362.23 ml  Output   2625 ml  Net -1262.77 ml    LABS: Basic Metabolic Panel:  Recent Labs  78/29/56 0358 07/04/12 0410  NA 134* 138  K 3.9 3.9  CL 99 103  CO2 26 26  GLUCOSE 272* 139*  BUN 47* 41*  CREATININE 2.14* 2.12*  CALCIUM 8.2* 8.3*  MG  --  2.0   Liver Function Tests: No results found for this basename: AST, ALT, ALKPHOS, BILITOT, PROT, ALBUMIN,  in the last 72 hours CBC:  Recent Labs  07/03/12 0358 07/04/12 0410  WBC 8.2 9.2  HGB 11.3* 11.0*  HCT 32.1* 30.9*  MCV 81.1 80.5  PLT 230 232    RADIOLOGY: Dg Chest Port 1 View  06/30/2012   *RADIOLOGY REPORT*  Clinical Data: Hypertension.  Diabetes.  Coronary artery disease. Shortness of breath.  PORTABLE CHEST - 1 VIEW  Comparison: 09/22/2011  Findings: Asymmetric airspace opacity is present throughout the right lung, but particularly confluent in the right lower lobe. Left  lung appears clear.  Prior CABG noted.  Heart size within normal limits.  IMPRESSION:  1.  Asymmetric airspace opacity in the right lung, suspicious for pneumonia or pulmonary hemorrhage.  This involves at least the right upper lobe and right lower lobe.   Original Report Authenticated By: Gaylyn Rong, M.D.    PHYSICAL EXAM General: NAD Neck: JVP difficult, no thyromegaly or thyroid nodule.  Lungs: Rhonchi throughout lung fields bilaterally.  CV: PMI enlarged   Heart regular S1/S2, no S3/S4, no murmur. 1+ ankle edema.  No carotid bruit.  Normal pedal pulses.  Abdomen: Soft, nontender, no hepatosplenomegaly, no distention.  Neurologic: Alert and oriented x 3.  Psych: Normal affect. Extremities: No clubbing or cyanosis.   TELEMETRY: Reviewed telemetry pt in NSR with PVCs  ASSESSMENT AND PLAN: CHF:  In setting of previous CABG and EF now 15% with SEMI  ECG with no acute changes RBBB.  Evaluated for cath by Dr Excell Seltzer 6/24 and deferred due to ARF Cr now improving 2.1 but still with clinically high filling pressures and dyspnea. Continue heparin ,  asa plavix and iv nitrates 1.2 L diuresis with bid iv lasix. Day 2 milrinone   Prognosis still guarded CXR still with basilar infiltrates ADd IS  K improved with supplementation Would continue iv nitro and milrinone over weekend Decesions about cath ? Monday still up in air  [name] 07/04/2012 8:49 AM

## 2012-07-05 LAB — CBC
MCH: 28.2 pg (ref 26.0–34.0)
Platelets: 240 10*3/uL (ref 150–400)
RBC: 3.87 MIL/uL — ABNORMAL LOW (ref 4.22–5.81)
WBC: 8.6 10*3/uL (ref 4.0–10.5)

## 2012-07-05 LAB — BASIC METABOLIC PANEL
Chloride: 104 mEq/L (ref 96–112)
Creatinine, Ser: 2.04 mg/dL — ABNORMAL HIGH (ref 0.50–1.35)
GFR calc Af Amer: 35 mL/min — ABNORMAL LOW (ref >90)

## 2012-07-05 LAB — HEPARIN LEVEL (UNFRACTIONATED): Heparin Unfractionated: 0.43 IU/mL (ref 0.30–0.70)

## 2012-07-05 LAB — GLUCOSE, CAPILLARY

## 2012-07-05 NOTE — Progress Notes (Signed)
ANTICOAGULATION CONSULT NOTE - Follow Up Consult  Pharmacy Consult:  Heparin Indication:  NSTEMI  Allergies  Allergen Reactions  . Niacin Itching   Patient Measurements: Height: 6' (182.9 cm) Weight: 207 lb 10.8 oz (94.2 kg) IBW/kg (Calculated) : 77.6 Heparin Dosing Weight: 90 kg   Vital Signs: Temp: 98.2 F (36.8 C) (06/28 0354) Temp src: Oral (06/28 0354) BP: 164/89 mmHg (06/28 0354) Pulse Rate: 73 (06/27 2000)  Labs:  Recent Labs  07/03/12 0358 07/04/12 0410 07/04/12 1130 07/04/12 2220 07/05/12 0450  HGB 11.3* 11.0*  --   --  10.9*  HCT 32.1* 30.9*  --   --  31.3*  PLT 230 232  --   --  240  HEPARINUNFRC 0.32 0.21* 0.20* 0.47 0.43  CREATININE 2.14* 2.12*  --   --  2.04*   Estimated Creatinine Clearance: 37.3 ml/min (by C-G formula based on Cr of 2.04).  Assessment: 78 yo male with NSTEMI to continue on IV heparin. Heparin level (0.43) on IV heparin rate of 1850 units/hr and is within desired goal range. His H/H is stable this morning and no noted bleeding complications.  Platelets are 240K and wnl.  Goal of Therapy:  Heparin level 0.3-0.7 units/ml Monitor platelets by anticoagulation protocol: Yes   Plan:  1. Continue IV heparin at 1850 units/hr.  2. Follow-up CBC, HL in AM   Nadara Mustard, PharmD., MS Clinical Pharmacist Pager:  [phone] Thank you for allowing pharmacy to be part of this patients care team. 07/05/2012, 7:12 AM

## 2012-07-05 NOTE — Progress Notes (Signed)
[facility] [place] TEAM 1 - Stepdown/ICU TEAM  Labs/vitals reviewed.  CBG and renal function noted.  No signif changes from medical consultative standpoint today.  Will continue to follow along with you.  Lonia Blood, MD [facility] Office  [phone] Pager [phone]  On-Call/Text Page:      [email or web address]      password [facility]

## 2012-07-05 NOTE — Progress Notes (Signed)
Patient ID: LINFORD QUINTELA, male   DOB: 10/12/1934, 78 y.o.   MRN: [id number]   Patient Name: Joshua Vincent Date of Encounter: 07/05/2012    SUBJECTIVE  No complaints of chest pain or shortness of breath. Continues to diurese with a slight improvement in creatinine.  CURRENT MEDS . amLODipine  10 mg Oral Daily  . aspirin EC  81 mg Oral Daily  . atorvastatin  80 mg Oral QHS  . carvedilol  12.5 mg Oral BID WC  . clopidogrel  75 mg Oral Q breakfast  . feeding supplement  237 mL Oral Q24H  . furosemide  80 mg Intravenous BID  . insulin aspart  0-15 Units Subcutaneous TID WC  . insulin aspart  0-5 Units Subcutaneous QHS  . insulin glargine  30 Units Subcutaneous QHS  . isosorbide mononitrate  60 mg Oral Daily  . potassium chloride  40 mEq Oral BID  . sodium chloride  3 mL Intravenous Q12H    OBJECTIVE  Filed Vitals:   07/05/12 0918 07/05/12 1000 07/05/12 1100 07/05/12 1157  BP: 137/48 143/113 130/59 150/80  Pulse:      Temp:      TempSrc:      Resp: 16 18 15 16   Height:      Weight:      SpO2: 87% 95% 97% 97%    Intake/Output Summary (Last 24 hours) at 07/05/12 1258 Last data filed at 07/05/12 1100  Gross per 24 hour  Intake 1718.77 ml  Output   4930 ml  Net -3211.23 ml   Filed Weights   07/02/12 0400 07/03/12 0300 07/04/12 0300  Weight: 203 lb 11.3 oz (92.4 kg) 206 lb 2.1 oz (93.5 kg) 207 lb 10.8 oz (94.2 kg)    PHYSICAL EXAM  General: Pleasant, NAD. Chronically ill-appearing Neuro: Alert and oriented X 3. Moves all extremities spontaneously. Psych: Normal affect. HEENT:  Normal. Poor dentition  Neck: Supple without bruits or JVD. Lungs:  Resp regular and unlabored, CTA. Heart: RRR no s3, s4, or murmurs. Abdomen: Soft, non-tender, non-distended, BS + x 4.  Extremities: No clubbing, cyanosis or edema. DP/PT/Radials 2+ and equal bilaterally.  Accessory Clinical Findings  CBC  Recent Labs  07/04/12 0410 07/05/12 0450  WBC 9.2 8.6  HGB 11.0* 10.9*    HCT 30.9* 31.3*  MCV 80.5 80.9  PLT 232 240   Basic Metabolic Panel  Recent Labs  07/04/12 0410 07/05/12 0450  NA 138 138  K 3.9 4.2  CL 103 104  CO2 26 26  GLUCOSE 139* 135*  BUN 41* 39*  CREATININE 2.12* 2.04*  CALCIUM 8.3* 8.7  MG 2.0  --    Liver Function Tests No results found for this basename: AST, ALT, ALKPHOS, BILITOT, PROT, ALBUMIN,  in the last 72 hours No results found for this basename: LIPASE, AMYLASE,  in the last 72 hours Cardiac Enzymes No results found for this basename: CKTOTAL, CKMB, CKMBINDEX, TROPONINI,  in the last 72 hours BNP No components found with this basename: POCBNP,  D-Dimer No results found for this basename: DDIMER,  in the last 72 hours Hemoglobin A1C No results found for this basename: HGBA1C,  in the last 72 hours Fasting Lipid Panel No results found for this basename: CHOL, HDL, LDLCALC, TRIG, CHOLHDL, LDLDIRECT,  in the last 72 hours Thyroid Function Tests No results found for this basename: TSH, T4TOTAL, FREET3, T3FREE, THYROIDAB,  in the last 72 hours  TELE  Normal sinus rhythm, PVCs  ECG  Radiology/Studies  US Renal Port  07/02/2012   *RADIOLOGY REPORT*  Clinical Data: Hypertensive diabetic patient with acute renal failure.  RENAL/URINARY TRACT ULTRASOUND COMPLETE  Comparison:  05/26/2009 CT.  Findings:  Right Kidney:  12.1 cm.  Cysts measuring up to 1.9 cm.  No hydronephrosis.  Renal parenchymal thinning.  Tiny non obstructing renal calculi.  Left Kidney:  Evaluation is slightly limited by patient's habitus/bowel gas.  The left kidney measures 12.0 cm in length.  No hydronephrosis.  No obvious mass.  Question tiny nonobstructing renal calculi.  Renal parenchymal thinning.  Bladder:  Foley catheter visualized.  Bladder decompressed.  Bilateral pleural effusions greater on the right.  IMPRESSION: Bilateral renal parenchymal thinning without evidence hydronephrosis.  Suggestion of nonobstructing tiny renal calculi bilaterally.   Right renal cysts measuring up to 1.9 cm.  Decompressed urinary bladder with Foley catheter in place.  Bilateral pleural effusions greater on the right.   Original Report Authenticated By: Lacy Duverney, M.D.   Dg Chest Port 1 View  07/03/2012   *RADIOLOGY REPORT*  Clinical Data: Persistent cough.  PORTABLE CHEST - 1 VIEW  Comparison: 09/22/2011  Findings: Bilateral lower lobe airspace opacities, right greater than left.  Diffuse interstitial prominence.  Heart is borderline in size.  Trace right pleural effusion.  Prior median sternotomy and CABG.  No acute bony abnormality.  IMPRESSION: Bilateral lower lobe airspace opacities and interstitial prominence.  Findings could represent asymmetric edema or infection.   Original Report Authenticated By: Charlett Nose, M.D.   Dg Chest Port 1 View  06/30/2012   *RADIOLOGY REPORT*  Clinical Data: Hypertension.  Diabetes.  Coronary artery disease. Shortness of breath.  PORTABLE CHEST - 1 VIEW  Comparison: 09/22/2011  Findings: Asymmetric airspace opacity is present throughout the right lung, but particularly confluent in the right lower lobe. Left lung appears clear.  Prior CABG noted.  Heart size within normal limits.  IMPRESSION:  1.  Asymmetric airspace opacity in the right lung, suspicious for pneumonia or pulmonary hemorrhage.  This involves at least the right upper lobe and right lower lobe.   Original Report Authenticated By: Gaylyn Rong, M.D.    ASSESSMENT AND PLAN  Principal Problem:   Acute on chronic diastolic CHF (congestive heart failure), NYHA class 4 Active Problems:   DM   HYPERTENSION   OSTEOARTHRITIS, SHOULDER, RIGHT   OSA (obstructive sleep apnea)   Non-ST elevation myocardial infarction (NSTEMI), initial care episode   Acute renal failure   Hyponatremia   Community acquired pneumonia   Hypokalemia    Continues to diuresis with slight improvement renal function. We'll continue with current program and decide on cardiac  catheterization on Monday. I answered all questions for patient and his wife.  Signed, Valera Castle MD

## 2012-07-06 LAB — CULTURE, BLOOD (ROUTINE X 2)
Culture: NO GROWTH
Culture: NO GROWTH

## 2012-07-06 LAB — GLUCOSE, CAPILLARY
Glucose-Capillary: 181 mg/dL — ABNORMAL HIGH (ref 70–99)
Glucose-Capillary: 284 mg/dL — ABNORMAL HIGH (ref 70–99)
Glucose-Capillary: 249 mg/dL — ABNORMAL HIGH (ref 70–99)
Glucose-Capillary: 241 mg/dL — ABNORMAL HIGH (ref 70–99)
Glucose-Capillary: 172 mg/dL — ABNORMAL HIGH (ref 70–99)

## 2012-07-06 LAB — BASIC METABOLIC PANEL
GFR calc Af Amer: 32 mL/min — ABNORMAL LOW (ref >90)
Sodium: 134 mEq/L — ABNORMAL LOW (ref 135–145)

## 2012-07-06 LAB — CBC
HCT: 32.2 % — ABNORMAL LOW (ref 39.0–52.0)
MCHC: 35.4 g/dL (ref 30.0–36.0)
MCV: 81.5 fL (ref 78.0–100.0)
RDW: 13.6 % (ref 11.5–15.5)

## 2012-07-06 MED ORDER — INSULIN GLARGINE 100 UNIT/ML ~~LOC~~ SOLN
17.0000 [IU] | SUBCUTANEOUS | Status: AC
  Filled 2012-07-06: qty 0.17

## 2012-07-06 MED ORDER — INSULIN GLARGINE 100 UNIT/ML ~~LOC~~ SOLN
34.0000 [IU] | Freq: Every day | SUBCUTANEOUS | Status: DC
  Administered 2012-07-06: 17 [IU] via SUBCUTANEOUS
  Administered 2012-07-07 – 2012-07-08 (×2): 34 [IU] via SUBCUTANEOUS
  Filled 2012-07-06 (×4): qty 0.34

## 2012-07-06 MED ORDER — LEVALBUTEROL HCL 0.63 MG/3ML IN NEBU
0.6300 mg | INHALATION_SOLUTION | Freq: Four times a day (QID) | RESPIRATORY_TRACT | Status: DC | PRN
Start: 2012-07-06 — End: 2012-07-09

## 2012-07-06 MED ORDER — TAMSULOSIN HCL 0.4 MG PO CAPS
0.4000 mg | ORAL_CAPSULE | Freq: Every day | ORAL | Status: DC
  Administered 2012-07-06 – 2012-07-08 (×3): 0.4 mg via ORAL
  Filled 2012-07-06 (×4): qty 1

## 2012-07-06 MED ORDER — INSULIN ASPART 100 UNIT/ML ~~LOC~~ SOLN
4.0000 [IU] | Freq: Three times a day (TID) | SUBCUTANEOUS | Status: DC
  Administered 2012-07-06 – 2012-07-09 (×7): 4 [IU] via SUBCUTANEOUS

## 2012-07-06 NOTE — Progress Notes (Signed)
ANTICOAGULATION CONSULT NOTE - Follow Up Consult  Pharmacy Consult:  Heparin Indication:  NSTEMI  Allergies  Allergen Reactions  . Niacin Itching   Patient Measurements: Height: 6' (182.9 cm) Weight: 194 lb 7.1 oz (88.2 kg) IBW/kg (Calculated) : 77.6 Heparin Dosing Weight: 88 kg   Vital Signs: Temp: 99.1 F (37.3 C) (06/29 0725) Temp src: Oral (06/29 0725) BP: 167/87 mmHg (06/29 0725) Pulse Rate: 90 (06/29 0725)  Labs:  Recent Labs  07/04/12 0410  07/04/12 2220 07/05/12 0450 07/06/12 0539  HGB 11.0*  --   --  10.9* 11.4*  HCT 30.9*  --   --  31.3* 32.2*  PLT 232  --   --  240 239  HEPARINUNFRC 0.21*  < > 0.47 0.43 0.51  CREATININE 2.12*  --   --  2.04* 2.19*  < > = values in this interval not displayed. Estimated Creatinine Clearance: 32 ml/min (by C-G formula based on Cr of 2.19).  Assessment: 78 yo male with NSTEMI to continue on IV heparin. Heparin level (0.51) on IV heparin rate of 1850 units/hr and is within desired goal range. His H/H is stable this morning and no noted bleeding complications.  Platelets are stable as well.  Noted plans to decide about cardiac cath tomorrow.  Goal of Therapy:  Heparin level 0.3-0.7 units/ml Monitor platelets by anticoagulation protocol: Yes   Plan:  1. Continue IV heparin at 1850 units/hr.  2. Follow-up CBC, HL in AM   Nadara Mustard, PharmD., MS Clinical Pharmacist Pager:  [phone] Thank you for allowing pharmacy to be part of this patients care team. 07/06/2012, 10:58 AM

## 2012-07-06 NOTE — Progress Notes (Signed)
Patient ID: Joshua Vincent, male   DOB: [birth year], 78 y.o.   MRN: [id number] He is doing well this morning. The chest pain. Shortness of breath improved and heart failure has resolved. His creatinine has leveled out at 2.1. He continues to diuresis.  We'll plan on cardiac catheterization tomorrow with coronary angiography only. Discussed with patient in detail. He agrees to proceed.  Plan coordinated with his RN [place].

## 2012-07-06 NOTE — Consult Note (Signed)
[facility] CONSULT f/u Note [place] TEAM 1 - Stepdown/ICU TEAM   Joshua Vincent ZOX:[id number] DOB: 10-20-1934 DOA: 06/30/2012 PCP: Benita Stabile, MD  Brief narrative: 78 y.o.w/ PMHx CAD, HTN, DM/current hemoglobin A1c= 6.9, and acute renal failure/chronic renal insufficiency (cr 1.12 December 2011) who reported feeling generally bad for 3 days. Per family, over 2 days he had eaten almost nothing and had not taken his medications.  The patient described some chest discomfort but stated it was only with breathing. He had some shortness of breath and dyspnea on exertion.  His symptoms worsened and he called EMS. He was admitted to the Cardiology service 6/23 for tx of acute on chronic diastolic CHF and non-ST elevation myocardial infarction.  [facility] was consulted on 6/23 to assist in management of hyperglycemia, acute on chronic renal failure, and a possible CAP.   Assessment/Plan:  Uncontrolled DM w/ Hyperglycemic Hyperosmolar State  CBG elevated in the evening hours yesterday - holding glucophage and glucotrol in setting of renal failure and possible need for cath soon, but given severity of CHF and continued renal insuff, will not plan to resume these meds at d/c   Acute on chronic renal failure Baseline creatinine 1.4 - crt appears to have plateaud in the 2.0-2.2 range - renal US revealed no hydro  Community acquired pneumonia? - doubt  WBCs not elevated and pt has not been febrile - clinically the pt did not appear to have an active pulmonary infection - stable s/p d/c of antibiotics - w/ some c/o intermittent cough and SOB today, will plan f/u CXR for AM  Intermittent urinary retention required placement of foley due to 450cc retained urine - per pt was a prior problem until he underwent TURP per Dr. Isabel Caprice - renal US unrevealing - foley to remain at this time in setting of acute renal failure - voiding trials post cath - initiate Flomax  Hypokalemia - resolved tx by  Cardiology  Acute on chronic CHF (congestive heart failure), NYHA class 4 As per Cardiology - previously only diastolic in nature - TTE this admit unfort reveals severe systolic failure w/ EF 15% in addition to grade 2 diastolic dysfunction - w/u underway   HYPERTENSION As per Cardiology - poorly controlled at present  OSTEOARTHRITIS, SHOULDER, RIGHT No complaints at this time   OSA (obstructive sleep apnea) Home CPAP regimen   Non-ST elevation myocardial infarction w/ hx of CAD: s/p CABG Per primary service (Cardiolgoy) - for probable cardiac cath in a.m.  Family Communication: No family present at time of exam  Antibiotics: Azithromycin 6/23>>6/25 Ceftriaxone 6/23>>6/26  DVT prophylaxis: IV heparin  HPI/Subjective: The patient is awake conversant and quite pleasant.  He denies nausea vomiting or abdominal pain.  He does complain of intermittent shortness of breath associated with a nonproductive cough.  Objective: Blood pressure 167/87, pulse 90, temperature 99.1 F (37.3 C), temperature source Oral, resp. rate 16, height 6' (1.829 m), weight 88.2 kg (194 lb 7.1 oz), SpO2 93.00%.  Intake/Output Summary (Last 24 hours) at 07/06/12 1050 Last data filed at 07/06/12 0800  Gross per 24 hour  Intake 1767.5 ml  Output   5675 ml  Net -3907.5 ml    Exam: General: No acute respiratory distress at rest in chair Lungs: Mild bibasilar crackles with good air movement throughout other fields with no wheeze Cardiovascular: Regular rate and rhythm Abdomen: Nontender, nondistended, soft, bowel sounds positive, no rebound, no ascites, no appreciable mass Extremities: No significant cyanosis, clubbing bilateral lower extremities  Data Reviewed: Basic Metabolic Panel:  Recent Labs Lab 06/30/12 1721 06/30/12 2216 06/30/12 2249  07/02/12 0355 07/03/12 0358 07/04/12 0410 07/05/12 0450 07/06/12 0539  NA 132*  --  134*  < > 137 134* 138 138 134*  K 2.9*  --  2.9*  < > 3.0* 3.9  3.9 4.2 4.3  CL 93*  --  96  < > 102 99 103 104 101  CO2 25  --  27  < > 28 26 26 26 27   GLUCOSE 414*  --  295*  < > 58* 272* 139* 135* 216*  BUN 46*  --  50*  < > 47* 47* 41* 39* 34*  CREATININE 2.59*  --  2.56*  < > 2.48* 2.14* 2.12* 2.04* 2.19*  CALCIUM 8.4  --  8.5  < > 8.1* 8.2* 8.3* 8.7 8.6  MG 2.1 2.1  --   --   --   --  2.0  --   --   PHOS  --   --  3.0  --   --   --   --   --   --   < > = values in this interval not displayed. Liver Function Tests:  Recent Labs Lab 06/30/12 1245 06/30/12 2249  AST 29 30  ALT 15 16  ALKPHOS 103 94  BILITOT 0.8 0.4  PROT 6.4 5.9*  ALBUMIN 2.1* 2.0*   CBC:  Recent Labs Lab 06/30/12 1245  07/02/12 0355 07/03/12 0358 07/04/12 0410 07/05/12 0450 07/06/12 0539  WBC 12.0*  < > 7.6 8.2 9.2 8.6 8.1  NEUTROABS 9.8*  --   --   --   --   --   --   HGB 13.4  < > 11.2* 11.3* 11.0* 10.9* 11.4*  HCT 36.9*  < > 31.1* 32.1* 30.9* 31.3* 32.2*  MCV 79.2  < > 80.2 81.1 80.5 80.9 81.5  PLT 211  < > 211 230 232 240 239  < > = values in this interval not displayed. Cardiac Enzymes:  Recent Labs Lab 06/30/12 1320 06/30/12 1721 06/30/12 2249 07/01/12 0450  TROPONINI 7.70* 12.11* 10.24* 4.95*   BNP (last 3 results)  Recent Labs  10/15/11 1229 12/19/11 1227 06/30/12 1320  PROBNP 276.0* 247.0* 52044.0*   CBG:  Recent Labs Lab 07/05/12 1218 07/05/12 1647 07/05/12 2113 07/05/12 2139 07/06/12 0728  GLUCAP 181* 284* 249* 241* 173*    Recent Results (from the past 240 hour(s))  CULTURE, BLOOD (ROUTINE X 2)     Status: None   Collection Time    06/30/12  3:50 PM      Result Value Range Status   Specimen Description BLOOD ARM LEFT   Final   Special Requests     Final   Value: BOTTLES DRAWN AEROBIC AND ANAEROBIC 10CC PATIENT ON FOLLOWING ZITHROMAX AND ROCEPHINE   Culture  Setup Time 06/30/2012 20:59   Final   Culture     Final   Value:        BLOOD CULTURE RECEIVED NO GROWTH TO DATE CULTURE WILL BE HELD FOR 5 DAYS BEFORE ISSUING A  FINAL NEGATIVE REPORT   Report Status PENDING   Incomplete  CULTURE, BLOOD (ROUTINE X 2)     Status: None   Collection Time    06/30/12  4:10 PM      Result Value Range Status   Specimen Description BLOOD LEFT HAND   Final   Special Requests     Final  Value: BOTTLES DRAWN AEROBIC ONLY 5CC PATIENT ON FOLLOWING ZITHROMAX AND ROCEPHINE   Culture  Setup Time 06/30/2012 20:59   Final   Culture     Final   Value:        BLOOD CULTURE RECEIVED NO GROWTH TO DATE CULTURE WILL BE HELD FOR 5 DAYS BEFORE ISSUING A FINAL NEGATIVE REPORT   Report Status PENDING   Incomplete  MRSA PCR SCREENING     Status: None   Collection Time    06/30/12  4:45 PM      Result Value Range Status   MRSA by PCR NEGATIVE  NEGATIVE Final   Comment:            The GeneXpert MRSA Assay (FDA     approved for NASAL specimens     only), is one component of a     comprehensive MRSA colonization     surveillance program. It is not     intended to diagnose MRSA     infection nor to guide or     monitor treatment for     MRSA infections.     Studies:  Recent x-ray studies have been reviewed in detail by the Attending Physician  Scheduled Meds:  Scheduled Meds: . amLODipine  10 mg Oral Daily  . aspirin EC  81 mg Oral Daily  . atorvastatin  80 mg Oral QHS  . carvedilol  12.5 mg Oral BID WC  . clopidogrel  75 mg Oral Q breakfast  . feeding supplement  237 mL Oral Q24H  . furosemide  80 mg Intravenous BID  . insulin aspart  0-15 Units Subcutaneous TID WC  . insulin aspart  0-5 Units Subcutaneous QHS  . insulin glargine  30 Units Subcutaneous QHS  . isosorbide mononitrate  60 mg Oral Daily  . potassium chloride  40 mEq Oral BID  . sodium chloride  3 mL Intravenous Q12H    Time spent on care of this patient:   Lonia Blood  [facility] Office  [phone] Pager - Text Page per Loretha Stapler as per below:  On-Call/Text Page:      [email or web address]      password [email or web address]  If 7PM-7AM, please contact  night-coverage [email or web address] Password [email or web address] 07/06/2012, 10:50 AM   LOS: 6 days

## 2012-07-07 ENCOUNTER — Inpatient Hospital Stay (HOSPITAL_COMMUNITY): Payer: Medicare Other

## 2012-07-07 ENCOUNTER — Encounter (HOSPITAL_COMMUNITY)
Admission: EM | Disposition: A | Discharge status: Home / Self Care | Payer: Self-pay | Source: Home / Self Care | Attending: Internal Medicine

## 2012-07-07 DIAGNOSIS — I2510 Atherosclerotic heart disease of native coronary artery without angina pectoris: Secondary | ICD-10-CM

## 2012-07-07 HISTORY — PX: CORONARY ANGIOGRAM: SHX5466

## 2012-07-07 HISTORY — PX: GRAFT(S) ANGIOGRAM: SHX5479

## 2012-07-07 LAB — CBC
HCT: 33.6 % — ABNORMAL LOW (ref 39.0–52.0)
MCHC: 34.5 g/dL (ref 30.0–36.0)
MCV: 81.6 fL (ref 78.0–100.0)
Platelets: 242 10*3/uL (ref 150–400)
RDW: 13.7 % (ref 11.5–15.5)
WBC: 7.5 10*3/uL (ref 4.0–10.5)

## 2012-07-07 LAB — BASIC METABOLIC PANEL
BUN: 38 mg/dL — ABNORMAL HIGH (ref 6–23)
Calcium: 9 mg/dL (ref 8.4–10.5)
GFR calc Af Amer: 32 mL/min — ABNORMAL LOW (ref >90)
GFR calc non Af Amer: 27 mL/min — ABNORMAL LOW (ref >90)
Glucose, Bld: 196 mg/dL — ABNORMAL HIGH (ref 70–99)
Potassium: 4.4 mEq/L (ref 3.5–5.1)

## 2012-07-07 LAB — GLUCOSE, CAPILLARY
Glucose-Capillary: 167 mg/dL — ABNORMAL HIGH (ref 70–99)
Glucose-Capillary: 128 mg/dL — ABNORMAL HIGH (ref 70–99)
Glucose-Capillary: 379 mg/dL — ABNORMAL HIGH (ref 70–99)

## 2012-07-07 SURGERY — CORONARY ANGIOGRAM

## 2012-07-07 SURGERY — LEFT HEART CATHETERIZATION WITH CORONARY/GRAFT ANGIOGRAM
Anesthesia: LOCAL

## 2012-07-07 MED ORDER — MIDAZOLAM HCL 2 MG/2ML IJ SOLN
INTRAMUSCULAR | Status: AC
  Filled 2012-07-07: qty 2

## 2012-07-07 MED ORDER — NITROGLYCERIN 0.2 MG/ML ON CALL CATH LAB
INTRAVENOUS | Status: AC
  Filled 2012-07-07: qty 1

## 2012-07-07 MED ORDER — SODIUM CHLORIDE 0.9 % IV SOLN: 250.0000 mL | INTRAVENOUS | Status: DC | PRN

## 2012-07-07 MED ORDER — SODIUM CHLORIDE 0.9 % IV SOLN
1.0000 mL/kg/h | INTRAVENOUS | Status: AC
  Administered 2012-07-07: 1 mL/kg/h via INTRAVENOUS

## 2012-07-07 MED ORDER — LIDOCAINE HCL (PF) 1 % IJ SOLN
INTRAMUSCULAR | Status: AC
  Filled 2012-07-07: qty 30

## 2012-07-07 MED ORDER — HEPARIN SODIUM (PORCINE) 5000 UNIT/ML IJ SOLN
5000.0000 [IU] | Freq: Three times a day (TID) | INTRAMUSCULAR | Status: DC
  Administered 2012-07-08 – 2012-07-09 (×4): 5000 [IU] via SUBCUTANEOUS
  Filled 2012-07-07 (×7): qty 1

## 2012-07-07 MED ORDER — HEPARIN (PORCINE) IN NACL 2-0.9 UNIT/ML-% IJ SOLN
INTRAMUSCULAR | Status: AC
  Filled 2012-07-07: qty 1000

## 2012-07-07 MED ORDER — HYDRALAZINE HCL 25 MG PO TABS
25.0000 mg | ORAL_TABLET | Freq: Three times a day (TID) | ORAL | Status: DC
  Administered 2012-07-07 – 2012-07-08 (×4): 25 mg via ORAL
  Filled 2012-07-07 (×7): qty 1

## 2012-07-07 MED ORDER — FENTANYL CITRATE 0.05 MG/ML IJ SOLN
INTRAMUSCULAR | Status: AC
  Filled 2012-07-07: qty 2

## 2012-07-07 MED ORDER — SODIUM CHLORIDE 0.9 % IJ SOLN
3.0000 mL | Freq: Two times a day (BID) | INTRAMUSCULAR | Status: DC
  Administered 2012-07-07: 3 mL via INTRAVENOUS

## 2012-07-07 MED ORDER — SODIUM CHLORIDE 0.9 % IJ SOLN: 3.0000 mL | INTRAMUSCULAR | Status: DC | PRN

## 2012-07-07 MED ORDER — SODIUM CHLORIDE 0.9 % IV SOLN
INTRAVENOUS | Status: DC
  Administered 2012-07-07: 08:00:00 via INTRAVENOUS

## 2012-07-07 MED ORDER — ASPIRIN 81 MG PO CHEW
324.0000 mg | CHEWABLE_TABLET | ORAL | Status: AC
  Administered 2012-07-07: 324 mg via ORAL
  Filled 2012-07-07: qty 4

## 2012-07-07 NOTE — Progress Notes (Signed)
ANTICOAGULATION CONSULT NOTE - Follow Up Consult  Pharmacy Consult:  Heparin Indication:  NSTEMI  Allergies  Allergen Reactions  . Niacin Itching   Patient Measurements: Height: 6' (182.9 cm) Weight: 175 lb 8 oz (79.606 kg) IBW/kg (Calculated) : 77.6 Heparin Dosing Weight: 88 kg   Vital Signs: Temp: 99 F (37.2 C) (06/30 0745) Temp src: Oral (06/30 0745) BP: 141/90 mmHg (06/30 0745) Pulse Rate: 88 (06/30 0338)  Labs:  Recent Labs  07/05/12 0450 07/06/12 0539 07/07/12 0530  HGB 10.9* 11.4* 11.6*  HCT 31.3* 32.2* 33.6*  PLT 240 239 242  HEPARINUNFRC 0.43 0.51 0.53  CREATININE 2.04* 2.19* 2.22*   Estimated Creatinine Clearance: 31.6 ml/min (by C-G formula based on Cr of 2.22).  Admit Complaint: 78 y.o.  male  admitted 06/30/2012 with  NSTEMI.  Pharmacy consulted to dose heparin.  PMH: CAD, CABG, HF EF 15%, HTN  Overnight Events: 07/07/2012 Ongoing CP, Cr down but still not at baseline, plan cath today  Assessment: Anticoagulation: NSTEMI: Heparin level at goal) on IV heparin rate of 1850 units/hr and is within desired goal range. His H/H is stable this morning and no noted bleeding complications.  Platelets are stable as well.  Noted plans to decide about cardiac cath tomorrow.  Cardiovascular: CHF (EF 15%), CAD (s/p CABG): coreg, hydralazine, lasix stopped for cath, amlo 10, asa, atorva, clop, 2244/6475 ( -4230)  HR 80-90, 140s/90s Current Weight: 175 lb 8 oz (79.606 kg); Admit wt: 199 lb  Endocrinology: DM : glucose up to 300, Lantus, SSI  Neurology/MSK: CKD, ARF, Flomax  Nephrology/Urology/Electrolytes/Optho: CrCl 31  PTA Medication Issues: Home Meds Not Ordered: doxazosin 8, glip 10 daily, lisinopril 20 daily, metformin 1g bid,   Best Practices: VTE Prophylaxis:  heparin   Goal of Therapy:  Heparin level 0.3-0.7 units/ml Monitor platelets by anticoagulation protocol: Yes   Plan:  1. Continue IV heparin at 1850 units/hr.  2. Follow-up CBC, HL in AM     Thank you for allowing pharmacy to be a part of this patients care team.  Lovenia Kim Pharm.D., BCPS Clinical Pharmacist 07/07/2012 9:53 AM Pager: [phone] Phone: [phone]

## 2012-07-07 NOTE — Progress Notes (Signed)
Patient ID: GIOVANY COSBY, male   DOB: [birth year], 78 y.o.   MRN: [id number]    SUBJECTIVE: Mr Cato was admitted with dyspnea/hypoxemia/CHF exacerbation.  He had been off some of his medications for over a week (not sure which).  He has had trouble getting all his medications in the past.  Initially no chest pain but had significant elevation in troponin.  Since he has been in the hospital, he has had on and off chest pain episodes with NTG gtt titrated up. He had further chest pain last night.    Marland Kitchen amLODipine  10 mg Oral Daily  . aspirin EC  81 mg Oral Daily  . atorvastatin  80 mg Oral QHS  . carvedilol  12.5 mg Oral BID WC  . clopidogrel  75 mg Oral Q breakfast  . feeding supplement  237 mL Oral Q24H  . hydrALAZINE  25 mg Oral Q8H  . insulin aspart  0-15 Units Subcutaneous TID WC  . insulin aspart  0-5 Units Subcutaneous QHS  . insulin aspart  4 Units Subcutaneous TID WC  . insulin glargine  34 Units Subcutaneous QHS  . potassium chloride  40 mEq Oral BID  . tamsulosin  0.4 mg Oral QHS  heparin gtt Milrinone gtt NTG gtt @ 40    Filed Vitals:   07/07/12 0430 07/07/12 0530 07/07/12 0545 07/07/12 0606  BP: 169/74 165/78 147/62   Pulse:      Temp:      TempSrc:      Resp:      Height:      Weight:    175 lb 8 oz (79.606 kg)  SpO2:        Intake/Output Summary (Last 24 hours) at 07/07/12 0753 Last data filed at 07/07/12 0600  Gross per 24 hour  Intake 2207.31 ml  Output   6475 ml  Net -4267.69 ml    LABS: Basic Metabolic Panel:  Recent Labs  21/30/86 0539 07/07/12 0530  NA 134* 134*  K 4.3 4.4  CL 101 99  CO2 27 29  GLUCOSE 216* 196*  BUN 34* 38*  CREATININE 2.19* 2.22*  CALCIUM 8.6 9.0   Liver Function Tests: No results found for this basename: AST, ALT, ALKPHOS, BILITOT, PROT, ALBUMIN,  in the last 72 hours No results found for this basename: LIPASE, AMYLASE,  in the last 72 hours CBC:  Recent Labs  07/06/12 0539 07/07/12 0530  WBC 8.1 7.5  HGB  11.4* 11.6*  HCT 32.2* 33.6*  MCV 81.5 81.6  PLT 239 242   Cardiac Enzymes: No results found for this basename: CKTOTAL, CKMB, CKMBINDEX, TROPONINI,  in the last 72 hours BNP: No components found with this basename: POCBNP,  D-Dimer: No results found for this basename: DDIMER,  in the last 72 hours Hemoglobin A1C: No results found for this basename: HGBA1C,  in the last 72 hours Fasting Lipid Panel: No results found for this basename: CHOL, HDL, LDLCALC, TRIG, CHOLHDL, LDLDIRECT,  in the last 72 hours Thyroid Function Tests: No results found for this basename: TSH, T4TOTAL, FREET3, T3FREE, THYROIDAB,  in the last 72 hours Anemia Panel: No results found for this basename: VITAMINB12, FOLATE, FERRITIN, TIBC, IRON, RETICCTPCT,  in the last 72 hours  RADIOLOGY: Dg Chest Port 1 View  06/30/2012   *RADIOLOGY REPORT*  Clinical Data: Hypertension.  Diabetes.  Coronary artery disease. Shortness of breath.  PORTABLE CHEST - 1 VIEW  Comparison: 09/22/2011  Findings: Asymmetric airspace opacity is present  throughout the right lung, but particularly confluent in the right lower lobe. Left lung appears clear.  Prior CABG noted.  Heart size within normal limits.  IMPRESSION:  1.  Asymmetric airspace opacity in the right lung, suspicious for pneumonia or pulmonary hemorrhage.  This involves at least the right upper lobe and right lower lobe.   Original Report Authenticated By: Gaylyn Rong, M.D.    PHYSICAL EXAM General: NAD Neck: JVP not elevated, no thyromegaly or thyroid nodule.  Lungs: Rhonchi throughout lung fields bilaterally.  CV: Nondisplaced PMI.  Heart regular S1/S2, no S3/S4, no murmur. No edema.  No carotid bruit.  Normal pedal pulses.  Abdomen: Soft, nontender, no hepatosplenomegaly, no distention.  Neurologic: Alert and oriented x 3.  Psych: Normal affect. Extremities: No clubbing or cyanosis.   TELEMETRY: Reviewed telemetry pt in NSR with PACs ASSESSMENT AND PLAN: 78 yo with  history of CAD s/p CABG, diastolic CHF, and CKD presented with dyspnea/hypoxemia yesterday.  CXR concerning for PNA versus asymmetric edema, BNP 52K, Troponin peaked at 12, creatinine up to 2.7 now down to 2.2.  Echo showed significant drop in EF to 15%.  Breathing much better but continues to have CP episodes. 1. Acute systolic CHF: EF newly noted to be down to 15% (45-50% prior).  He was volume overloaded on admission but has been diuresed with IV Lasix + milrinone and now appears euvolemic.  Good diuresis yesterday, appears euvolemic now.  - Continue current Coreg.  - Add hydralazine 25 mg tid (will wean off NTG gtt to Imdur).  Not ACEI candidate at this time.  - Stop IV Lasix (none today) and milrinone, will resume po Lasix after cath depending on renal function.  2. Diabetes: Blood glucose now under better control.   3. CAD: s/p CABG.  Last cath with 2/4 patent vein grafts.  Troponin to 12 this admission suggestive of NSTEMI.  He has had chest pain repetitively this admission, last episode last night.  His creatinine was 2.7 at admission, now 2.2.  Baseline had been around 1.4.  I held off of LHC initially but creatinine appears to have plateaued at this point.  Dr. Daleen Squibb discussed cath with patient over the weekend.  Given ongoing chest pain episodes, think this approach is reasonable with minimization of contrast.  We had extensive discussions today about risk to renal function (also discussed with Dr. Daleen Squibb over the weekend).  Also of note, had CVA after cath in 2010 (radial access).  His last cath in 2013 was uneventful. - Medical treatment for NSTEMI: on Plavix, ASA, heparin gtt, atorvastatin 80, Coreg.  - LHC today as above. 4. Renal: Acute on chronic kidney injury.  Baseline creatinine 1.4.  Creatinine has decreased from 2.7 to 2.2 and now appears to have plateaued.  As above, cath with minimization of contrast.  5. HTN: Still high BP.  As above, adding hydralazine.   Compliance long-term with  this med will be an issue.  Marca Ancona 07/07/2012 7:53 AM

## 2012-07-07 NOTE — Interval H&P Note (Signed)
History and Physical Interval Note:  07/07/2012 4:30 PM  Joshua Vincent  has presented today for surgery, with the diagnosis of NSTEMI  The various methods of treatment have been discussed with the patient and family. After consideration of risks, benefits and other options for treatment, the patient has consented to  Procedure(s): LEFT HEART CATHETERIZATION WITH CORONARY/GRAFT ANGIOGRAM (N/A) as a surgical intervention .  The patient's history has been reviewed, patient examined, no change in status, stable for surgery.  I have reviewed the patient's chart and labs.  Questions were answered to the patient's satisfaction.    Cath Lab Visit (complete for each Cath Lab visit)  Clinical Evaluation Leading to the Procedure:   ACS: yes  Non-ACS:    Anginal Classification: CCS III  Anti-ischemic medical therapy: Minimal Therapy (1 class of medications)  Non-Invasive Test Results: No non-invasive testing performed  Prior CABG: Previous CABG       Theron Arista [facility] 07/07/2012 4:30 PM'

## 2012-07-07 NOTE — Progress Notes (Signed)
[facility] Consult f/u note  [place] TEAM 1 - Stepdown/ICU TEAM  Labs/vitals reviewed.  CBG and renal function noted.  No signif changes from medical consultative standpoint today.  Will continue to follow along with you.  Lonia Blood, MD [facility] Office  [phone] Pager [phone]  On-Call/Text Page:      [email or web address]      password [facility]

## 2012-07-07 NOTE — H&P (View-Only) (Signed)
Patient ID: Joshua Vincent, male   DOB: 07/08/1934, 77 y.o.   MRN: [id number]    SUBJECTIVE: Joshua Vincent was admitted with dyspnea/hypoxemia/CHF exacerbation.  He had been off some of his medications for over a week (not sure which).  He has had trouble getting all his medications in the past.  Initially no chest pain but had significant elevation in troponin.  Since he has been in the hospital, he has had on and off chest pain episodes with NTG gtt titrated up. He had further chest pain last night.    . amLODipine  10 mg Oral Daily  . aspirin EC  81 mg Oral Daily  . atorvastatin  80 mg Oral QHS  . carvedilol  12.5 mg Oral BID WC  . clopidogrel  75 mg Oral Q breakfast  . feeding supplement  237 mL Oral Q24H  . hydrALAZINE  25 mg Oral Q8H  . insulin aspart  0-15 Units Subcutaneous TID WC  . insulin aspart  0-5 Units Subcutaneous QHS  . insulin aspart  4 Units Subcutaneous TID WC  . insulin glargine  34 Units Subcutaneous QHS  . potassium chloride  40 mEq Oral BID  . tamsulosin  0.4 mg Oral QHS  heparin gtt Milrinone gtt NTG gtt @ 40    Filed Vitals:   07/07/12 0430 07/07/12 0530 07/07/12 0545 07/07/12 0606  BP: 169/74 165/78 147/62   Pulse:      Temp:      TempSrc:      Resp:      Height:      Weight:    175 lb 8 oz (79.606 kg)  SpO2:        Intake/Output Summary (Last 24 hours) at 07/07/12 0753 Last data filed at 07/07/12 0600  Gross per 24 hour  Intake 2207.31 ml  Output   6475 ml  Net -4267.69 ml    LABS: Basic Metabolic Panel:  Recent Labs  07/06/12 0539 07/07/12 0530  NA 134* 134*  K 4.3 4.4  CL 101 99  CO2 27 29  GLUCOSE 216* 196*  BUN 34* 38*  CREATININE 2.19* 2.22*  CALCIUM 8.6 9.0   Liver Function Tests: No results found for this basename: AST, ALT, ALKPHOS, BILITOT, PROT, ALBUMIN,  in the last 72 hours No results found for this basename: LIPASE, AMYLASE,  in the last 72 hours CBC:  Recent Labs  07/06/12 0539 07/07/12 0530  WBC 8.1 7.5  HGB  11.4* 11.6*  HCT 32.2* 33.6*  MCV 81.5 81.6  PLT 239 242   Cardiac Enzymes: No results found for this basename: CKTOTAL, CKMB, CKMBINDEX, TROPONINI,  in the last 72 hours BNP: No components found with this basename: POCBNP,  D-Dimer: No results found for this basename: DDIMER,  in the last 72 hours Hemoglobin A1C: No results found for this basename: HGBA1C,  in the last 72 hours Fasting Lipid Panel: No results found for this basename: CHOL, HDL, LDLCALC, TRIG, CHOLHDL, LDLDIRECT,  in the last 72 hours Thyroid Function Tests: No results found for this basename: TSH, T4TOTAL, FREET3, T3FREE, THYROIDAB,  in the last 72 hours Anemia Panel: No results found for this basename: VITAMINB12, FOLATE, FERRITIN, TIBC, IRON, RETICCTPCT,  in the last 72 hours  RADIOLOGY: Dg Chest Port 1 View  06/30/2012   *RADIOLOGY REPORT*  Clinical Data: Hypertension.  Diabetes.  Coronary artery disease. Shortness of breath.  PORTABLE CHEST - 1 VIEW  Comparison: 09/22/2011  Findings: Asymmetric airspace opacity is present   throughout the right lung, but particularly confluent in the right lower lobe. Left lung appears clear.  Prior CABG noted.  Heart size within normal limits.  IMPRESSION:  1.  Asymmetric airspace opacity in the right lung, suspicious for pneumonia or pulmonary hemorrhage.  This involves at least the right upper lobe and right lower lobe.   Original Report Authenticated By: Joshua Vincent, M.D.    PHYSICAL EXAM General: NAD Neck: JVP not elevated, no thyromegaly or thyroid nodule.  Lungs: Rhonchi throughout lung fields bilaterally.  CV: Nondisplaced PMI.  Heart regular S1/S2, no S3/S4, no murmur. No edema.  No carotid bruit.  Normal pedal pulses.  Abdomen: Soft, nontender, no hepatosplenomegaly, no distention.  Neurologic: Alert and oriented x 3.  Psych: Normal affect. Extremities: No clubbing or cyanosis.   TELEMETRY: Reviewed telemetry pt in NSR with PACs ASSESSMENT AND PLAN: 77 yo with  history of CAD s/p CABG, diastolic CHF, and CKD presented with dyspnea/hypoxemia yesterday.  CXR concerning for PNA versus asymmetric edema, BNP 52K, Troponin peaked at 12, creatinine up to 2.7 now down to 2.2.  Echo showed significant drop in EF to 15%.  Breathing much better but continues to have CP episodes. 1. Acute systolic CHF: EF newly noted to be down to 15% (45-50% prior).  He was volume overloaded on admission but has been diuresed with IV Lasix + milrinone and now appears euvolemic.  Good diuresis yesterday, appears euvolemic now.  - Continue current Coreg.  - Add hydralazine 25 mg tid (will wean off NTG gtt to Imdur).  Not ACEI candidate at this time.  - Stop IV Lasix (none today) and milrinone, will resume po Lasix after cath depending on renal function.  2. Diabetes: Blood glucose now under better control.   3. CAD: s/p CABG.  Last cath with 2/4 patent vein grafts.  Troponin to 12 this admission suggestive of NSTEMI.  He has had chest pain repetitively this admission, last episode last night.  His creatinine was 2.7 at admission, now 2.2.  Baseline had been around 1.4.  I held off of LHC initially but creatinine appears to have plateaued at this point.  Joshua Vincent discussed cath with patient over the weekend.  Given ongoing chest pain episodes, think this approach is reasonable with minimization of contrast.  We had extensive discussions today about risk to renal function (also discussed with Joshua Vincent over the weekend).  Also of note, had CVA after cath in 2010 (radial access).  His last cath in 2013 was uneventful. - Medical treatment for NSTEMI: on Plavix, ASA, heparin gtt, atorvastatin 80, Coreg.  - LHC today as above. 4. Renal: Acute on chronic kidney injury.  Baseline creatinine 1.4.  Creatinine has decreased from 2.7 to 2.2 and now appears to have plateaued.  As above, cath with minimization of contrast.  5. HTN: Still high BP.  As above, adding hydralazine.   Compliance long-term with  this med will be an issue.  Joshua Vincent 07/07/2012 7:53 AM   

## 2012-07-07 NOTE — CV Procedure (Signed)
   Cardiac Catheterization Procedure Note  Name: Joshua Vincent MRN: [id number] DOB: May 08, 1934  Procedure:  Selective Coronary Angiography, SVG angiography, LIMA angiography  Indication: 78 year old white male with known history of coronary disease presents with symptoms of worsening congestive heart failure. He is status post CABG. He has positive troponins. On prior cardiac catheterization was the vein graft to the RCA and to the diagonal were occluded.   Procedural details: The right groin was prepped, draped, and anesthetized with 1% lidocaine. Using modified Seldinger technique, a 5 French sheath was introduced into the right femoral artery. Standard Judkins catheters were used for coronary angiography and left ventriculography. Catheter exchanges were performed over a guidewire. There were no immediate procedural complications. The patient was transferred to the post catheterization recovery area for further monitoring. 35 cc of Omnipaque was used.  Procedural Findings: Hemodynamics:  AO 144/74 with a mean of 104 mmHg LV not done   Coronary angiography: Coronary dominance: right  Left mainstem: Normal.  Left anterior descending (LAD): The LAD is occluded proximally.  Left circumflex (LCx): The left circumflex is occluded proximally.  Right coronary artery (RCA): The right coronary is a large dominant vessel. There is segmental 30% disease in the mid vessel. There is 30% disease in the distal vessel. The PDA is small and diffusely diseased up to 90%. The first posterior lateral branches very small with 95% stenosis proximally. There are right-to-left collaterals to the septal perforator branches of the LAD.  The LIMA graft to the LAD is widely patent.  The saphenous vein graft to the obtuse marginal vessel is widely patent with good runoff. This graft also gives some collaterals to the PDA and ramus branch.  Left ventriculography: Not performed due to to concerns of dye  load.  Final Conclusions:   1. Severe three-vessel obstructive coronary disease. 2. Patent LIMA graft to the LAD. 3. Patent saphenous vein graft to the OM.  Recommendations: Compared to his prior cardiac catheterization there is no significant change. Recommend continued medical management.  Theron Arista [facility] 07/07/2012, 4:56 PM

## 2012-07-07 NOTE — Progress Notes (Signed)
07/07/2012 1600 To Cath lab.  Allexus Ovens, Linnell Fulling

## 2012-07-08 LAB — BASIC METABOLIC PANEL
CO2: 26 mEq/L (ref 19–32)
Calcium: 9.4 mg/dL (ref 8.4–10.5)
Chloride: 101 mEq/L (ref 96–112)
Glucose, Bld: 125 mg/dL — ABNORMAL HIGH (ref 70–99)
Potassium: 4.8 mEq/L (ref 3.5–5.1)
Sodium: 136 mEq/L (ref 135–145)

## 2012-07-08 LAB — CBC
Hemoglobin: 13.6 g/dL (ref 13.0–17.0)
MCH: 28.9 pg (ref 26.0–34.0)
Platelets: 274 10*3/uL (ref 150–400)
RBC: 4.71 MIL/uL (ref 4.22–5.81)
WBC: 7.6 10*3/uL (ref 4.0–10.5)

## 2012-07-08 LAB — GLUCOSE, CAPILLARY
Glucose-Capillary: 103 mg/dL — ABNORMAL HIGH (ref 70–99)
Glucose-Capillary: 136 mg/dL — ABNORMAL HIGH (ref 70–99)
Glucose-Capillary: 207 mg/dL — ABNORMAL HIGH (ref 70–99)

## 2012-07-08 MED ORDER — CARVEDILOL 6.25 MG PO TABS
18.7500 mg | ORAL_TABLET | Freq: Two times a day (BID) | ORAL | Status: DC
  Administered 2012-07-08 – 2012-07-09 (×3): 18.75 mg via ORAL
  Filled 2012-07-08 (×6): qty 1

## 2012-07-08 MED ORDER — ISOSORBIDE MONONITRATE ER 60 MG PO TB24
60.0000 mg | ORAL_TABLET | Freq: Every day | ORAL | Status: DC
  Administered 2012-07-08: 60 mg via ORAL
  Filled 2012-07-08 (×4): qty 1

## 2012-07-08 MED ORDER — HYDRALAZINE HCL 50 MG PO TABS
50.0000 mg | ORAL_TABLET | Freq: Three times a day (TID) | ORAL | Status: DC
  Administered 2012-07-08 – 2012-07-09 (×3): 50 mg via ORAL
  Filled 2012-07-08 (×6): qty 1

## 2012-07-08 MED ORDER — FUROSEMIDE 40 MG PO TABS
40.0000 mg | ORAL_TABLET | Freq: Two times a day (BID) | ORAL | Status: DC
  Administered 2012-07-08 – 2012-07-09 (×3): 40 mg via ORAL
  Filled 2012-07-08 (×5): qty 1

## 2012-07-08 NOTE — Progress Notes (Signed)
NUTRITION FOLLOW UP  Intervention:   1.  Continue with current management.  RD to follow for ongoing nutritional adequacy.   Nutrition Dx:   Inadequate oral intake related to poor appetite as evidenced by pt reported no intake x3 days PTA.   Goal:  PO intake to meet >/=90% estimated nutrition needs.   Monitor:  PO intake, weight trends, labs, I/O's  Assessment:   Pt admitted with increased shortness of breath, hx of CHF and CAD.  Pt with difficulty obtaining medications.  Pt reported decreased intake on admission with clinically insignificant wt loss.   Pt unavailable at time of visit- readying for transfer from unit. Per chart review, pt's PO intake has improved to 100% of meals.  Pt is also drinking Glucerna once daily.  Height: Ht Readings from Last 1 Encounters:  07/08/12 6' (1.829 m)    Weight Status:   Wt Readings from Last 1 Encounters:  07/08/12 185 lb 6.5 oz (84.1 kg)    Re-estimated needs:  Kcal: 2000-2200 Protein: 90-100g Fluid: 2-2.2 L/day  Skin: intact, generalized edema  Diet Order: Cardiac   Intake/Output Summary (Last 24 hours) at 07/08/12 1056 Last data filed at 07/08/12 0900  Gross per 24 hour  Intake 2267.58 ml  Output   3600 ml  Net -1332.42 ml    Last BM: 6/30   Labs:   Recent Labs Lab 07/04/12 0410  07/06/12 0539 07/07/12 0530 07/08/12 0335  NA 138  < > 134* 134* 136  K 3.9  < > 4.3 4.4 4.8  CL 103  < > 101 99 101  CO2 26  < > 27 29 26   BUN 41*  < > 34* 38* 34*  CREATININE 2.12*  < > 2.19* 2.22* 2.16*  CALCIUM 8.3*  < > 8.6 9.0 9.4  MG 2.0  --   --   --   --   GLUCOSE 139*  < > 216* 196* 125*  < > = values in this interval not displayed.  CBG (last 3)   Recent Labs  07/07/12 1813 07/07/12 2125 07/08/12 0806  GLUCAP 132* 379* 103*    Scheduled Meds: . amLODipine  10 mg Oral Daily  . aspirin EC  81 mg Oral Daily  . atorvastatin  80 mg Oral QHS  . carvedilol  18.75 mg Oral BID WC  . clopidogrel  75 mg Oral Q  breakfast  . feeding supplement  237 mL Oral Q24H  . furosemide  40 mg Oral BID  . heparin  5,000 Units Subcutaneous Q8H  . hydrALAZINE  50 mg Oral Q8H  . insulin aspart  0-15 Units Subcutaneous TID WC  . insulin aspart  0-5 Units Subcutaneous QHS  . insulin aspart  4 Units Subcutaneous TID WC  . insulin glargine  34 Units Subcutaneous QHS  . isosorbide mononitrate  60 mg Oral Daily  . potassium chloride  40 mEq Oral BID  . tamsulosin  0.4 mg Oral QHS    Continuous Infusions: . sodium chloride      Loyce Dys, MS RD LDN Clinical Inpatient Dietitian Pager: [phone] Weekend/After hours pager: [phone]

## 2012-07-08 NOTE — Progress Notes (Signed)
Room searched for reading glasses----they were found in trash can near bed the patient enroute to unit 4700 via w/c, all belongings ( shoes , etc...) . Pt conversed w/son on phone while in w/c( about to be tx'd to room 4702 ) .

## 2012-07-08 NOTE — Progress Notes (Addendum)
Patient ID: Joshua Vincent, male   DOB: [birth year], 78 y.o.   MRN: [id number]    SUBJECTIVE: Mr Maute was admitted with dyspnea/hypoxemia/CHF exacerbation.  He had been off some of his medications for over a week (not sure which).  He has had trouble getting all his medications in the past.  Initially no chest pain but had significant elevation in troponin.  Since he has been in the hospital, he had on and off chest pain episodes with NTG gtt titrated up.  LHC yesterday showed no significant change to his anatomy.  He is now off milrinone gtt.    Marland Kitchen amLODipine  10 mg Oral Daily  . aspirin EC  81 mg Oral Daily  . atorvastatin  80 mg Oral QHS  . carvedilol  18.75 mg Oral BID WC  . clopidogrel  75 mg Oral Q breakfast  . feeding supplement  237 mL Oral Q24H  . furosemide  40 mg Oral BID  . heparin  5,000 Units Subcutaneous Q8H  . hydrALAZINE  50 mg Oral Q8H  . insulin aspart  0-15 Units Subcutaneous TID WC  . insulin aspart  0-5 Units Subcutaneous QHS  . insulin aspart  4 Units Subcutaneous TID WC  . insulin glargine  34 Units Subcutaneous QHS  . isosorbide mononitrate  60 mg Oral Daily  . potassium chloride  40 mEq Oral BID  . tamsulosin  0.4 mg Oral QHS      Filed Vitals:   07/08/12 0339 07/08/12 0400 07/08/12 0401 07/08/12 0544  BP:  171/89  180/67  Pulse:  84    Temp:   98.4 F (36.9 C)   TempSrc:   Oral   Resp:  19    Height: 6' (1.829 m)     Weight: 185 lb 6.5 oz (84.1 kg)     SpO2: 97% 93%      Intake/Output Summary (Last 24 hours) at 07/08/12 0723 Last data filed at 07/08/12 0600  Gross per 24 hour  Intake 1856.18 ml  Output   3600 ml  Net -1743.82 ml    LABS: Basic Metabolic Panel:  Recent Labs  40/98/11 0530 07/08/12 0335  NA 134* 136  K 4.4 4.8  CL 99 101  CO2 29 26  GLUCOSE 196* 125*  BUN 38* 34*  CREATININE 2.22* 2.16*  CALCIUM 9.0 9.4   Liver Function Tests: No results found for this basename: AST, ALT, ALKPHOS, BILITOT, PROT, ALBUMIN,  in the  last 72 hours No results found for this basename: LIPASE, AMYLASE,  in the last 72 hours CBC:  Recent Labs  07/07/12 0530 07/08/12 0335  WBC 7.5 7.6  HGB 11.6* 13.6  HCT 33.6* 39.5  MCV 81.6 83.9  PLT 242 274   Cardiac Enzymes: No results found for this basename: CKTOTAL, CKMB, CKMBINDEX, TROPONINI,  in the last 72 hours BNP: No components found with this basename: POCBNP,  D-Dimer: No results found for this basename: DDIMER,  in the last 72 hours Hemoglobin A1C: No results found for this basename: HGBA1C,  in the last 72 hours Fasting Lipid Panel: No results found for this basename: CHOL, HDL, LDLCALC, TRIG, CHOLHDL, LDLDIRECT,  in the last 72 hours Thyroid Function Tests: No results found for this basename: TSH, T4TOTAL, FREET3, T3FREE, THYROIDAB,  in the last 72 hours Anemia Panel: No results found for this basename: VITAMINB12, FOLATE, FERRITIN, TIBC, IRON, RETICCTPCT,  in the last 72 hours  RADIOLOGY: Dg Chest Port 1 View  06/30/2012   *  RADIOLOGY REPORT*  Clinical Data: Hypertension.  Diabetes.  Coronary artery disease. Shortness of breath.  PORTABLE CHEST - 1 VIEW  Comparison: 09/22/2011  Findings: Asymmetric airspace opacity is present throughout the right lung, but particularly confluent in the right lower lobe. Left lung appears clear.  Prior CABG noted.  Heart size within normal limits.  IMPRESSION:  1.  Asymmetric airspace opacity in the right lung, suspicious for pneumonia or pulmonary hemorrhage.  This involves at least the right upper lobe and right lower lobe.   Original Report Authenticated By: Gaylyn Rong, M.D.    PHYSICAL EXAM General: NAD Neck: JVP not elevated, no thyromegaly or thyroid nodule.  Lungs: Rhonchi throughout lung fields bilaterally.  CV: Nondisplaced PMI.  Heart regular S1/S2, no S3/S4, no murmur. No edema.  No carotid bruit.  Normal pedal pulses.  Abdomen: Soft, nontender, no hepatosplenomegaly, no distention.  Neurologic: Alert and  oriented x 3.  Psych: Normal affect. Extremities: No clubbing or cyanosis.   TELEMETRY: Reviewed telemetry pt in NSR   ASSESSMENT AND PLAN: 78 yo with history of CAD s/p CABG, diastolic CHF, and CKD presented with dyspnea/hypoxemia yesterday.  CXR concerning for PNA versus asymmetric edema, BNP 52K, Troponin peaked at 12, creatinine up to 2.7 now down to 2.2.  Echo showed significant drop in EF to 15%.  Breathing much improved. 1. Acute systolic CHF: EF newly noted to be down to 15% (45-50% prior).  He was volume overloaded on admission but has been diuresed with IV Lasix + milrinone and now appears euvolemic.   - Can increase Coreg to 18.75 mg bid.  - Increase hydralazine to 50 mg tid and add Imdur 60 mg daily now that off NTG gtt.  Not ACEI candidate at this time.  - Creatinine stable, will resume po Lasix 40 mg bid today.   2. Diabetes: Blood glucose now under better control.   3. CAD: s/p CABG.  Troponin to 12 this admission suggestive of NSTEMI.  However, LHC yesterday showed stable anatomy.   - Medical treatment for NSTEMI: on Plavix, ASA, atorvastatin 80, Coreg.  4. Renal: Acute on chronic kidney injury.  Creatinine stable after cath.  5. HTN: Still high BP.  As above, increasing hydralazine and Coreg.   May go to floor.    Marca Ancona 07/08/2012 7:23 AM

## 2012-07-08 NOTE — Progress Notes (Signed)
Pt states that he left his phone charger in his room on 2900 when he was transferred. 2900 notified and RN was told that no phone charger has been turned in yet. Baron Hamper, RN 07/08/2012

## 2012-07-09 ENCOUNTER — Other Ambulatory Visit: Payer: Self-pay | Admitting: Physician Assistant

## 2012-07-09 ENCOUNTER — Encounter: Payer: Self-pay | Admitting: Cardiology

## 2012-07-09 ENCOUNTER — Encounter (HOSPITAL_COMMUNITY): Payer: Self-pay | Admitting: Physician Assistant

## 2012-07-09 DIAGNOSIS — I5021 Acute systolic (congestive) heart failure: Secondary | ICD-10-CM

## 2012-07-09 LAB — CBC
MCH: 28.2 pg (ref 26.0–34.0)
MCHC: 33.8 g/dL (ref 30.0–36.0)
Platelets: 234 10*3/uL (ref 150–400)
RDW: 14.2 % (ref 11.5–15.5)

## 2012-07-09 LAB — BASIC METABOLIC PANEL
Calcium: 9.2 mg/dL (ref 8.4–10.5)
GFR calc Af Amer: 32 mL/min — ABNORMAL LOW (ref >90)
GFR calc non Af Amer: 27 mL/min — ABNORMAL LOW (ref >90)
Potassium: 4.9 mEq/L (ref 3.5–5.1)
Sodium: 137 mEq/L (ref 135–145)

## 2012-07-09 MED ORDER — ISOSORBIDE MONONITRATE ER 60 MG PO TB24
90.0000 mg | ORAL_TABLET | Freq: Every day | ORAL | Status: DC
  Administered 2012-07-09: 90 mg via ORAL
  Filled 2012-07-09: qty 1

## 2012-07-09 MED ORDER — CARVEDILOL 12.5 MG PO TABS: 18.7500 mg | ORAL_TABLET | Freq: Two times a day (BID) | ORAL | Status: DC

## 2012-07-09 MED ORDER — CLOPIDOGREL BISULFATE 75 MG PO TABS: 75.0000 mg | ORAL_TABLET | Freq: Every day | ORAL | Status: DC

## 2012-07-09 MED ORDER — HYDRALAZINE HCL 50 MG PO TABS
75.0000 mg | ORAL_TABLET | Freq: Three times a day (TID) | ORAL | Status: DC
  Administered 2012-07-09: 75 mg via ORAL
  Filled 2012-07-09 (×3): qty 1

## 2012-07-09 MED ORDER — POTASSIUM CHLORIDE CRYS ER 20 MEQ PO TBCR: 20.0000 meq | EXTENDED_RELEASE_TABLET | Freq: Two times a day (BID) | ORAL | Status: DC

## 2012-07-09 MED ORDER — HYDRALAZINE HCL 25 MG PO TABS: 75.0000 mg | ORAL_TABLET | Freq: Three times a day (TID) | ORAL | Status: DC

## 2012-07-09 MED ORDER — FUROSEMIDE 40 MG PO TABS: 40.0000 mg | ORAL_TABLET | Freq: Two times a day (BID) | ORAL | Status: DC

## 2012-07-09 MED ORDER — ISOSORBIDE MONONITRATE ER 60 MG PO TB24: 90.0000 mg | ORAL_TABLET | Freq: Every day | ORAL | Status: DC

## 2012-07-09 NOTE — Progress Notes (Addendum)
Patient ID: Joshua Vincent, male   DOB: [birth year], 78 y.o.   MRN: [id number]    SUBJECTIVE: Joshua Vincent was admitted with dyspnea/hypoxemia/CHF exacerbation.  He had been off some of his medications for over a week (not sure which).  He has had trouble getting all his medications in the past.  Initially no chest pain but had significant elevation in troponin.  Since he has been in the hospital, he had on and off chest pain episodes with NTG gtt titrated up.  LHC earlier this week showed no significant change to his anatomy.  He is now off milrinone gtt and doing well, no chest pain or dyspnea.   Marland Kitchen amLODipine  10 mg Oral Daily  . aspirin EC  81 mg Oral Daily  . atorvastatin  80 mg Oral QHS  . carvedilol  18.75 mg Oral BID WC  . clopidogrel  75 mg Oral Q breakfast  . feeding supplement  237 mL Oral Q24H  . furosemide  40 mg Oral BID  . heparin  5,000 Units Subcutaneous Q8H  . hydrALAZINE  75 mg Oral Q8H  . insulin aspart  0-15 Units Subcutaneous TID WC  . insulin aspart  0-5 Units Subcutaneous QHS  . insulin aspart  4 Units Subcutaneous TID WC  . insulin glargine  34 Units Subcutaneous QHS  . isosorbide mononitrate  90 mg Oral Daily  . potassium chloride  40 mEq Oral BID  . tamsulosin  0.4 mg Oral QHS      Filed Vitals:   07/08/12 1854 07/08/12 2057 07/08/12 2145 07/09/12 0540  BP: 142/78 134/73 145/83 152/81  Pulse: 76 70 73 80  Temp:  97.8 F (36.6 C) 97.9 F (36.6 C) 97.9 F (36.6 C)  TempSrc:  Oral Oral Oral  Resp:  18 18 19   Height:      Weight:    182 lb 4.8 oz (82.691 kg)  SpO2:  96% 99% 99%    Intake/Output Summary (Last 24 hours) at 07/09/12 0756 Last data filed at 07/09/12 0540  Gross per 24 hour  Intake   1440 ml  Output   2175 ml  Net   -735 ml    LABS: Basic Metabolic Panel:  Recent Labs  40/98/11 0335 07/09/12 0527  NA 136 137  K 4.8 4.9  CL 101 103  CO2 26 25  GLUCOSE 125* 97  BUN 34* 34*  CREATININE 2.16* 2.21*  CALCIUM 9.4 9.2   Liver  Function Tests: No results found for this basename: AST, ALT, ALKPHOS, BILITOT, PROT, ALBUMIN,  in the last 72 hours No results found for this basename: LIPASE, AMYLASE,  in the last 72 hours CBC:  Recent Labs  07/08/12 0335 07/09/12 0527  WBC 7.6 6.4  HGB 13.6 12.5*  HCT 39.5 37.0*  MCV 83.9 83.5  PLT 274 234   Cardiac Enzymes: No results found for this basename: CKTOTAL, CKMB, CKMBINDEX, TROPONINI,  in the last 72 hours BNP: No components found with this basename: POCBNP,  D-Dimer: No results found for this basename: DDIMER,  in the last 72 hours Hemoglobin A1C: No results found for this basename: HGBA1C,  in the last 72 hours Fasting Lipid Panel: No results found for this basename: CHOL, HDL, LDLCALC, TRIG, CHOLHDL, LDLDIRECT,  in the last 72 hours Thyroid Function Tests: No results found for this basename: TSH, T4TOTAL, FREET3, T3FREE, THYROIDAB,  in the last 72 hours Anemia Panel: No results found for this basename: VITAMINB12, FOLATE, FERRITIN, TIBC, IRON,  RETICCTPCT,  in the last 72 hours  RADIOLOGY: Dg Chest Port 1 View  06/30/2012   *RADIOLOGY REPORT*  Clinical Data: Hypertension.  Diabetes.  Coronary artery disease. Shortness of breath.  PORTABLE CHEST - 1 VIEW  Comparison: 09/22/2011  Findings: Asymmetric airspace opacity is present throughout the right lung, but particularly confluent in the right lower lobe. Left lung appears clear.  Prior CABG noted.  Heart size within normal limits.  IMPRESSION:  1.  Asymmetric airspace opacity in the right lung, suspicious for pneumonia or pulmonary hemorrhage.  This involves at least the right upper lobe and right lower lobe.   Original Report Authenticated By: Gaylyn Rong, M.D.    PHYSICAL EXAM General: NAD Neck: JVP not elevated, no thyromegaly or thyroid nodule.  Lungs: Rhonchi throughout lung fields bilaterally.  CV: Nondisplaced PMI.  Heart regular S1/S2, no S3/S4, no murmur. No edema.  No carotid bruit.  Normal  pedal pulses.  Abdomen: Soft, nontender, no hepatosplenomegaly, no distention.  Neurologic: Alert and oriented x 3.  Psych: Normal affect. Extremities: No clubbing or cyanosis.   TELEMETRY: Reviewed telemetry pt in NSR   ASSESSMENT AND PLAN: 78 yo with history of CAD s/p CABG, diastolic CHF, and CKD presented with dyspnea/hypoxemia yesterday.  CXR concerning for PNA versus asymmetric edema, BNP 52K, Troponin peaked at 12, creatinine up to 2.7 now down to 2.2.  Echo showed significant drop in EF to 15%.  Breathing much improved. 1. Acute systolic CHF: EF newly noted to be down to 15% (45-50% prior).  He was volume overloaded on admission but has been diuresed with IV Lasix + milrinone and now appears euvolemic.   - Continue Coreg 18.75 mg bid.  - Increase hydralazine to 75 mg tid and and Imdur to 90 mg daily.  Not ACEI candidate at this time.  - Creatinine stable, continue Lasix 40 mg bid today.   2. Diabetes: Blood glucose now under better control.   3. CAD: s/p CABG.  Troponin to 12 this admission suggestive of NSTEMI.  However, LHC this week showed stable anatomy.   - Medical treatment for NSTEMI: on Plavix, ASA, atorvastatin 80, Coreg.  4. Renal: Acute on chronic kidney injury.  Creatinine stable after cath.  5. HTN: Still high BP.  As above, increasing hydralazine. 6. Disposition: May go home today. Needs home health followup.  Needs followup with me or PA in < 1 week.  BMET at followup.  Meds: amlodipine 10 daily, ASA 81, atorva 80, Plavix 75, hydralazine 75 mg tid, Imdur 90 daily, KCl 40 daily, home diabetes meds.      Joshua Vincent 07/09/2012 7:56 AM

## 2012-07-09 NOTE — Progress Notes (Signed)
07/09/12 1330 In to speak with pt. about home health services.  Pt. has had [facility] in past, and was satisfied with their services.  TC to Hilda Lias, with [facility], to give referral for [facility] RN for Heart Failure Management.  Pt. to dc home today with family. Tera Mater, RN, BSN NCM [phone]

## 2012-07-09 NOTE — Discharge Summary (Signed)
Discharge Summary   Patient ID: Joshua Vincent MRN: [id number], DOB/AGE: Apr 30, 1934 78 y.o. Admit date: 06/30/2012 D/C date:     07/09/2012  Primary Cardiologist: Shirlee Latch  Primary Discharge Diagnoses:  1. Acute systolic CHF - EF dropped to 15% this admission (previously 40% 08/2011, then 55-60% in 09/2011) - required milrinonine this admission, dc wt 182lb 2. Acute hypoxic respiratory failure requiring bipap 3. Diabetes mellitus 4. CAD s/p CABG - NSTEMI this admission but [facility] 07/07/12 showing stable anatomy, treated medically - prior history: 4v CABG 01/2009; NSTEMI 08/2011 no culprit lesion 5. Acute on chronic kidney injury 6. HTN 7. Mild-mod MR by echo 07/01/12 8. BPH with history of urinary retention  Secondary Discharge Diagnoses:  1. HLD 2. Osteoarthrosis, unspecified whether generalized or localized, shoulder region  3. History of tobacco abuse  4. CVA - peri-catheterization in 12/10. Patient initially developed double vision and MRI showed right dorsal mid-brain infarct. Carotid dopplers with no significant disease. Double vision has resolved.  5. Bladder cancer s/p resection 7/11 6. PVCs - 3 week event monitor 8/11: no signifiant arrhythmias, occ PVCs  7. Iron deficiency anemia 8. Leg wound, left 9. Obesity  Hospital Course: Joshua Vincent is a 78 y/o M with history of CAD s/p CABG 2011, CHF (EF 40% by cath 08/2011, f/u echo 55-60% in 09/2011), DM, HTN who presented to [facility] 06/30/2012 with complaints of feeling bad for 3 days. Per family, he had eaten almost nothing for several days and had not taken his medications. He could not state which medications he had actually been taking. He reported some chest discomfort but stated it was only with breathing. He reported worsened shortness of breath/DOE along with nonproductive cough. He denied fever, LEE, or PND.Due to worsening symptoms, he called EMS the day of admission. In route to the hospital, EMS gave him sublingual  nitroglycerin which helped his respiratory status. In the emergency room he was hypoxic and very hypertensive. He was initially on BiPAP but was able to get off this. His oxygen saturation on a nasal cannula is 85-88%. CXR showed asymmetric airspace opacity in the right lung, suspicious for pneumonia or pulmonary hemorrhage. He was started on antibiotics by internal medicine for CAP, but these were later discontinued as the patient did not appear to have active pulmonary infection. He was afebrile. EKG showed NSR, old RBBB. pBNP was 52k and initial troponin was 7.7. He was felt to have acute CHF and NSTEMI thus was placed on IV lasix as well as IV heparin. His troponin peaked near 12. In the setting of AKI on CKD, cardiac cath was delayed. In the interim he was treated medically. He required initiation of IV NTG on 07/01/12 for recurrent CP. 2D Echo showed moderate LVH, EF 15%, diffuse HK, grade 2 diastolic dysfunction, mild-mod MR, RV systolic function moderately reduced. On 07/03/12 his Cr appeared to be improving, but he was still with clinically high filling pressures and dyspnea thus IV milrinone was added. Tele did exhibit PVCs and ventricular trigeminy but otherwise remained stable. By Monday 07/07/12 he appeared euvolemic. Milrinone was stopped. NTG gtt was changed to Imdur and hydralazine was added. He was not felt to be a candidate for ACEI/ARB at present with his AKI. His Cr appeared to plateau at 2.2 (baseline 1.4). It was felt that he was OK to proceed with cardiac cath that day which demonstrated severe 3v CAD, patent LIMA-LAD, patent SVG-OM - compared to his prior cardiac catheterization there is no  significant change. Medical management was recommended. Creatinine remained stable post-cath and Lasix was able to resumed. Today he is feeling well. He has diuresed -15L & -17lb to a discharge weight of 182. He is satting 99% on RA this morning. Dr. Shirlee Latch has seen and examined him and feels he is stable for  discharge. Home health was ordered. Dr. Shirlee Latch would like the patient seen in the office in <1 week with a BMET. Per our schedulers, Dr. Shirlee Latch is off next week and there are no extender appointments available in this time frame until the week after. Our office scheduler is currently working on trying to fit him in and she will call him with this appointment. The patient may need to be seen by another provider in the interim before following back up with Dr. Shirlee Latch. It should be determined during his follow-up visit how long he should remain on Plavix.  This admission, the patient also had several internal medicine issues: - the internal medicine team consulted for hyperglycemia given an admitting CBG of 392 with anion gap and assisted with medication management. Glucophage was discontinued in setting of renal failure. The patient was instructed to call PCP to discuss further plan for monitoring blood sugars. - he showed signs of urinary retention which has had in the past, responsive to foley placement. Renal US showed bilateral renal parenchymal thinning without evidence hydronephrosis, R renal cysts measuring up to 1.9cm, suggestion of nonobstructing tiny renal calculi bilaterally, decompressed urinary bladder with Foley in place. Foley was discontinued and flomax was added (at discharge, his home Cardura was resumed).  Discharge Vitals: Blood pressure 146/80, pulse 80, temperature 97.9 F (36.6 C), temperature source Oral, resp. rate 19, height 6' (1.829 m), weight 182 lb 4.8 oz (82.691 kg), SpO2 99.00%.  Labs: Lab Results  Component Value Date   WBC 6.4 07/09/2012   HGB 12.5* 07/09/2012   HCT 37.0* 07/09/2012   MCV 83.5 07/09/2012   PLT 234 07/09/2012     Recent Labs Lab 07/09/12 0527  NA 137  K 4.9  CL 103  CO2 25  BUN 34*  CREATININE 2.21*  CALCIUM 9.2  GLUCOSE 97   No results found for this basename: CKTOTAL, CKMB, TROPONINI,  in the last 72 hours Lab Results  Component Value Date    CHOL 255* 07/01/2012   HDL 31* 07/01/2012   LDLCALC 201* 07/01/2012   TRIG 114 07/01/2012   No results found for this basename: DDIMER    Diagnostic Studies/Procedures   Cardiac catheterization this admission, please see full report and above for summary.  2D Echo 07/01/12 - Left ventricle: The cavity size was normal. Wall thickness was increased in a pattern of moderate LVH. The estimated ejection fraction was 15%. Diffuse hypokinesis. Features are consistent with a pseudonormal left ventricular filling pattern, with concomitant abnormal relaxation and increased filling pressure (grade 2 diastolic dysfunction). - Mitral valve: Mild to moderate regurgitation. - Left atrium: The atrium was mildly dilated. - Right ventricle: Systolic function was moderately reduced.   US Renal Port  07/02/2012   *RADIOLOGY REPORT*  Clinical Data: Hypertensive diabetic patient with acute renal failure.  RENAL/URINARY TRACT ULTRASOUND COMPLETE  Comparison:  05/26/2009 CT.  Findings:  Right Kidney:  12.1 cm.  Cysts measuring up to 1.9 cm.  No hydronephrosis.  Renal parenchymal thinning.  Tiny non obstructing renal calculi.  Left Kidney:  Evaluation is slightly limited by patient's habitus/bowel gas.  The left kidney measures 12.0 cm in length.  No  hydronephrosis.  No obvious mass.  Question tiny nonobstructing renal calculi.  Renal parenchymal thinning.  Bladder:  Foley catheter visualized.  Bladder decompressed.  Bilateral pleural effusions greater on the right.  IMPRESSION: Bilateral renal parenchymal thinning without evidence hydronephrosis.  Suggestion of nonobstructing tiny renal calculi bilaterally.  Right renal cysts measuring up to 1.9 cm.  Decompressed urinary bladder with Foley catheter in place.  Bilateral pleural effusions greater on the right.   Original Report Authenticated By: Lacy Duverney, M.D.   Dg Chest Port 1 View 07/07/2012   *RADIOLOGY REPORT*  Clinical Data: Short of breath, evaluate for  pulmonary edema or infiltrate  PORTABLE CHEST - 1 VIEW  Comparison: Prior chest x-ray 07/03/2012  Findings: Markedly improved bibasilar airspace opacities compared to the prior radiograph.  Findings most consistent with resolving asymmetric pulmonary edema.  Cardiopericardial silhouette is also less prominent.  The patient status post median sternotomy with evidence of prior multivessel CABG including LIMA bypass.  No focal airspace consolidation.  Some residual linear atelectasis in the inferior lingula.  No pneumothorax.  No acute osseous abnormality.  IMPRESSION:  Significantly improved bibasilar aeration most consistent with resolving asymmetric pulmonary edema.  Decreased size of the cardiopericardial silhouette.   Original Report Authenticated By: Malachy Moan, M.D.   Dg Chest Port 1 View 07/03/2012   *RADIOLOGY REPORT*  Clinical Data: Persistent cough.  PORTABLE CHEST - 1 VIEW  Comparison: 09/22/2011  Findings: Bilateral lower lobe airspace opacities, right greater than left.  Diffuse interstitial prominence.  Heart is borderline in size.  Trace right pleural effusion.  Prior median sternotomy and CABG.  No acute bony abnormality.  IMPRESSION: Bilateral lower lobe airspace opacities and interstitial prominence.  Findings could represent asymmetric edema or infection.   Original Report Authenticated By: Charlett Nose, M.D.   Dg Chest Port 1 View 06/30/2012   *RADIOLOGY REPORT*  Clinical Data: Hypertension.  Diabetes.  Coronary artery disease. Shortness of breath.  PORTABLE CHEST - 1 VIEW  Comparison: 09/22/2011  Findings: Asymmetric airspace opacity is present throughout the right lung, but particularly confluent in the right lower lobe. Left lung appears clear.  Prior CABG noted.  Heart size within normal limits.  IMPRESSION:  1.  Asymmetric airspace opacity in the right lung, suspicious for pneumonia or pulmonary hemorrhage.  This involves at least the right upper lobe and right lower lobe.   Original  Report Authenticated By: Gaylyn Rong, M.D.    Discharge Medications     Medication List    STOP taking these medications       lisinopril 20 MG tablet  Commonly known as:  PRINIVIL,ZESTRIL     metFORMIN 1000 MG tablet  Commonly known as:  GLUCOPHAGE     nabumetone 500 MG tablet  Commonly known as:  RELAFEN      TAKE these medications       amLODipine 10 MG tablet  Commonly known as:  NORVASC  Take 10 mg by mouth daily.     aspirin EC 81 MG tablet  Take 81 mg by mouth daily.     atorvastatin 80 MG tablet  Commonly known as:  LIPITOR  Take 1 tablet (80 mg total) by mouth at bedtime.     carvedilol 12.5 MG tablet  Commonly known as:  COREG  Take 1.5 tablets (18.75 mg total) by mouth 2 (two) times daily.     clopidogrel 75 MG tablet  Commonly known as:  PLAVIX  Take 1 tablet (75 mg total) by mouth daily  with breakfast.     CoQ-10 100 MG Caps  Take 1 capsule by mouth daily.     doxazosin 8 MG tablet  Commonly known as:  CARDURA  Take 1 tablet (8 mg total) by mouth at bedtime.     furosemide 40 MG tablet  Commonly known as:  LASIX  Take 1 tablet (40 mg total) by mouth 2 (two) times daily.     glipiZIDE 5 MG 24 hr tablet  Commonly known as:  GLUCOTROL XL  Take 10 mg by mouth daily.     hydrALAZINE 25 MG tablet  Commonly known as:  APRESOLINE  Take 3 tablets (75 mg total) by mouth every 8 (eight) hours.     isosorbide mononitrate 60 MG 24 hr tablet  Commonly known as:  IMDUR  Take 1.5 tablets (90 mg total) by mouth daily.     LANTUS SOLOSTAR [place]  Inject 35 Units into the skin daily.     nitroGLYCERIN 0.4 MG SL tablet  Commonly known as:  NITROSTAT  Place 1 tablet (0.4 mg total) under the tongue every 5 (five) minutes as needed. For chest pain     potassium chloride SA 20 MEQ tablet  Commonly known as:  K-DUR,KLOR-CON  Take 1 tablet (20 mEq total) by mouth 2 (two) times daily.     VITAMIN D PO  Take 1 tablet by mouth daily.         Disposition   The patient will be discharged in stable condition to home. Discharge Orders   Future Appointments Provider Department Dept Phone   09/04/2012 10:45 AM Barbaraann Share, MD [facility] [phone]   Future Orders Complete By Expires     Diet - low sodium heart healthy  As directed     Discharge instructions  As directed     Comments:      One of your heart tests showed weakness of the heart muscle this admission. This may make you more susceptible to weight gain from fluid retention, which can lead to symptoms that we call heart failure.   For patients with congestive heart failure, we give them these special instructions:  1. Follow a low-salt diet and watch your fluid intake. In general, you should not be taking in more than 2 liters of fluid per day (no more than 8 glasses per day). Some patients are restricted to less than 1.5 liters of fluid per day (no more than 6 glasses per day). This includes sources of water in foods like soup, coffee, tea, milk, etc. 2. Weigh yourself on the same scale at same time of day and keep a log. 3. Call your doctor: (Anytime you feel any of the following symptoms)  - 3-4 pound weight gain in 1-2 days or 2 pounds overnight  - Shortness of breath, with or without a dry hacking cough  - Swelling in the hands, feet or stomach  - If you have to sleep on extra pillows at night in order to breathe   IT IS IMPORTANT TO LET YOUR DOCTOR KNOW EARLY ON IF YOU ARE HAVING SYMPTOMS SO WE CAN HELP YOU!    Increase activity slowly  As directed     Comments:      No driving for 1 week. No lifting over 10 lbs for 2 weeks. No sexual activity for 2 weeks. You may not return to work until cleared by your heart doctor. Keep procedure site clean & dry. If you notice increased pain, swelling,  bleeding or pus, call/return!  You may shower, but no soaking baths/hot tubs/pools for 1 week.      Follow-up Information   Follow up with Benita Stabile, MD. (Your blood sugar was quite uncontrolled when you came into the hospital. Call your primary doctor upon discharge to come up with a plan for following your blood sugar closely. Your Metformin was stopped due to your kidney dysfunction.)    Contact information:   [facility] [address] [name] [address] [phone]       Follow up with [facility], MD. (Follow up with your urologist for your urinary retention.)    Contact information:   [address]                         [place] [address] [phone]       Follow up with Marca Ancona, MD. (Our office will call you for a follow-up appointment. Please call the office if you have not heard from [place] within 3 days.)    Contact information:   [address] [place] [address] [phone]         Duration of Discharge Encounter: Greater than 30 minutes including physician and PA time.  Signed, Yahsir Wickens PA-C 07/09/2012, 10:00 AM

## 2012-07-09 NOTE — Progress Notes (Signed)
Patient evaluated for community based chronic disease management services with [facility] Care Management Program as a benefit of patient's [employer]. Patient was admitted on 6.23.14 for CHF.  Patient will receive a post discharge transition of care call and will be evaluated for monthly home visits for assessments and disease process education. Spoke with patient at bedside to explain [facility] Care Management services.  Patient indicated that he is having difficultly affording his medicines under his current [employer].  He obtains his medications from [employer] in 90 day supplies.  He purchases some of them at the [employer] on [facility] because they are cheaper.  Written consents have been obtained.  Left contact information and [facility] literature at bedside. Made inpatient Case Manager aware that [facility] Care Management following. Of note, [facility] Care Management services does not replace or interfere with any services that are arranged by inpatient case management or social work.  For additional questions or referrals please contact Anibal Henderson BSN RN [facility] Liaison at [phone].

## 2012-07-09 NOTE — Progress Notes (Signed)
Pt discharged per w/c with personal belongings. Has all papererwork including exit care notes on  Diab diet 1800 cal ADA and Sick day management Accompanied by 2 volunteers and son.

## 2012-07-09 NOTE — Progress Notes (Signed)
Went over all discharge instructions with pt.  

## 2012-07-10 LAB — GLUCOSE, CAPILLARY

## 2012-07-14 ENCOUNTER — Ambulatory Visit (INDEPENDENT_AMBULATORY_CARE_PROVIDER_SITE_OTHER): Payer: Medicare Other | Admitting: Cardiology

## 2012-07-14 ENCOUNTER — Encounter: Payer: Self-pay | Admitting: Cardiology

## 2012-07-14 ENCOUNTER — Other Ambulatory Visit: Payer: Medicare Other

## 2012-07-14 VITALS — BP 108/52 | HR 72 | Ht 72.0 in | Wt 189.0 lb

## 2012-07-14 DIAGNOSIS — Z7689 Persons encountering health services in other specified circumstances: Secondary | ICD-10-CM

## 2012-07-14 DIAGNOSIS — I2510 Atherosclerotic heart disease of native coronary artery without angina pectoris: Secondary | ICD-10-CM

## 2012-07-14 DIAGNOSIS — Z7189 Other specified counseling: Secondary | ICD-10-CM

## 2012-07-14 DIAGNOSIS — I5032 Chronic diastolic (congestive) heart failure: Secondary | ICD-10-CM

## 2012-07-14 DIAGNOSIS — I5021 Acute systolic (congestive) heart failure: Secondary | ICD-10-CM

## 2012-07-14 LAB — HEPATIC FUNCTION PANEL
ALT: 23 U/L (ref 0–53)
AST: 16 U/L (ref 0–37)
Albumin: 2.7 g/dL — ABNORMAL LOW (ref 3.5–5.2)
Total Bilirubin: 0.6 mg/dL (ref 0.3–1.2)

## 2012-07-14 LAB — LIPID PANEL
Cholesterol: 180 mg/dL (ref 0–200)
HDL: 33.4 mg/dL — ABNORMAL LOW (ref >39.00)
Triglycerides: 195 mg/dL — ABNORMAL HIGH (ref 0.0–149.0)

## 2012-07-14 LAB — BASIC METABOLIC PANEL
Calcium: 8.9 mg/dL (ref 8.4–10.5)
GFR: 29.12 mL/min — ABNORMAL LOW (ref >60.00)
Glucose, Bld: 216 mg/dL — ABNORMAL HIGH (ref 70–99)
Sodium: 134 mEq/L — ABNORMAL LOW (ref 135–145)

## 2012-07-14 NOTE — Patient Instructions (Addendum)
Please increase Lasix (Furosemide) by 20 mg for 3 days.  After that - return to normal dose. Continue all other medications as listed.  Generally speaking you should take twice a day medications 12 hours apart.  You need to be established with a primary care doctor.  Please exercise as your back allows.  Please have blood work today.  Follow up with Dr Shirlee Latch in 2 to 3 weeks (Or an extender)

## 2012-07-14 NOTE — Progress Notes (Signed)
HPI The patient presents for transition of care followup. He was discharged on 74 after an admission for respiratory failure. This was felt primarily to be heart failure though he was treated briefly for pneumonia. There was no evidence of this. He was found to have an ejection fraction which was 15% which was much lower than previous the EF documented last year. He had a cardiac catheterization which demonstrated native three-vessel disease with stable graft anatomy as described below. She was managed medically. Since going home he has been breathing better. He is denying any PND or orthopnea. He has no chest pressure, neck or arm discomfort. He has gained about 8 pounds but is not having any new edema. He's trying to watch his salt but is not strict. He tries to remember to weigh himself daily.  Allergies  Allergen Reactions  . Niacin Itching    Current Outpatient Prescriptions  Medication Sig Dispense Refill  . amLODipine (NORVASC) 10 MG tablet Take 10 mg by mouth daily.      Marland Kitchen aspirin EC 81 MG tablet Take 81 mg by mouth daily.      Marland Kitchen atorvastatin (LIPITOR) 80 MG tablet Take 1 tablet (80 mg total) by mouth at bedtime.  30 tablet  6  . carvedilol (COREG) 12.5 MG tablet Take 1.5 tablets (18.75 mg total) by mouth 2 (two) times daily.      . Cholecalciferol (VITAMIN D PO) Take 1 tablet by mouth daily.      . clopidogrel (PLAVIX) 75 MG tablet Take 1 tablet (75 mg total) by mouth daily with breakfast.  30 tablet  3  . Coenzyme Q10 (COQ-10) 100 MG CAPS Take 1 capsule by mouth daily.      Marland Kitchen doxazosin (CARDURA) 8 MG tablet Take 1 tablet (8 mg total) by mouth at bedtime.  90 tablet  3  . furosemide (LASIX) 40 MG tablet Take 1 tablet (40 mg total) by mouth 2 (two) times daily.      Marland Kitchen glipiZIDE (GLUCOTROL XL) 5 MG 24 hr tablet Take 10 mg by mouth daily.      . hydrALAZINE (APRESOLINE) 25 MG tablet Take 3 tablets (75 mg total) by mouth every 8 (eight) hours.  270 tablet  3  . Insulin Glargine (LANTUS  SOLOSTAR Trinity) Inject 35 Units into the skin daily.       . isosorbide mononitrate (IMDUR) 60 MG 24 hr tablet Take 1.5 tablets (90 mg total) by mouth daily.  45 tablet  3  . nitroGLYCERIN (NITROSTAT) 0.4 MG SL tablet Place 1 tablet (0.4 mg total) under the tongue every 5 (five) minutes as needed. For chest pain  25 tablet  3  . potassium chloride SA (K-DUR,KLOR-CON) 20 MEQ tablet Take 1 tablet (20 mEq total) by mouth 2 (two) times daily.  60 tablet  3   No current facility-administered medications for this visit.    Past Medical History  Diagnosis Date  . HTN (hypertension)   . HLD (hyperlipidemia)   . Benign prostatic hypertrophy     Hx of urinary retention as well.  . Diabetes mellitus, type 2   . Osteoarthrosis, unspecified whether generalized or localized, shoulder region   . History of tobacco abuse     quit 1992  . CAD (coronary artery disease)     a. 4v CABG (1/11): LIMA LAD, SVG-D, SVG-OM, SVG-PLV. b. NSTEMI 08/2011 cath  revealed severe native CAD, patent SVG to OM & LIMA to LAD, occluded SVG to RCA and  SVG to Diagonal, EF 40%, no culprit lesion for PCI, med therapy - f/u EF 55-60%. c. NSTEMI 06/2012 - stable anatomy by cath, med rx recommended.  . CVA (cerebral infarction)     peri-catheterization in 12/10.  Patient initially developed double vision and MRI showed right dorsal mid-brain infarct.  Carotid dopplers with no significant disease.  Double vision has resolved.    . Systolic and diastolic CHF, acute on chronic     a. EF 40% by cath 08/2011; EF 55-60% by echo 09/2011. b. EF drop to 15% by echo 06/2012.  . Bladder cancer     s/p resection 7/11  . PVC's (premature ventricular contractions)     3 week  event monitor 8/11: no signifiant arrhythmias, occ PVCs  . Iron deficiency anemia   . Leg wound, left 08/2011  . Obesity   . Chronic kidney disease     hx of kidney stones  . CKD (chronic kidney disease)     a. Baseline Cr 1.4. b. AKI 06/2012 with Cr at discharge around 2.2.  .  Mitral regurgitation     a. Mild-mod by echo 07/01/12.    Past Surgical History  Procedure Laterality Date  . Appendectomy    . Transurethral resection of prostate    . Coronary artery bypass graft  2011  . Retinal detachment surgery    . Eye surgery  March 20 2012    ROS:  As stated in the HPI and negative for all other systems.  PHYSICAL EXAM BP 108/52  Pulse 72  Ht 6' (1.829 m)  Wt 189 lb (85.73 kg)  BMI 25.63 kg/m2  SpO2 98% GENERAL:  Well appearing HEENT:  Pupils equal round and reactive, fundi not visualized, oral mucosa unremarkable NECK:  No jugular venous distention, waveform within normal limits, carotid upstroke brisk and symmetric, no bruits, no thyromegaly LYMPHATICS:  No cervical, inguinal adenopathy LUNGS:  Clear to auscultation bilaterally BACK:  No CVA tenderness CHEST:  Well healed sternotomy scar. HEART:  PMI not displaced or sustained,S1 and S2 within normal limits, no S3, no S4, no clicks, no rubs, no murmurs ABD:  Flat, positive bowel sounds normal in frequency in pitch, no bruits, no rebound, no guarding, no midline pulsatile mass, no hepatomegaly, no splenomegaly EXT:  2 plus pulses throughout, no edema, no cyanosis no clubbing, chronic venous stasis changes SKIN:  No rashes no nodules NEURO:  Cranial nerves II through XII grossly intact, motor grossly intact throughout PSYCH:  Cognitively intact, oriented to person place and time   ASSESSMENT AND PLAN  CARDIOMYOPATHY:  His weight is up slightly. He will take an extra 20 mg of Lasix for the next 3 days. We discussed the importance of salt restriction and daily weights. He will get a basic metabolic profile today.  I did review with him his medications and the timing of these as his wife had this question.  CAD:  He had stable disease as described. He will continue to medically.  CKD:  We will check a basic metabolic profile.

## 2012-07-15 ENCOUNTER — Telehealth: Payer: Self-pay | Admitting: Cardiology

## 2012-07-15 NOTE — Telephone Encounter (Signed)
Pt's nurse Ramiro Harvest visited pt for the first time today, she notice that pt had an irregular heart beat 56 to 58 beats/minute. Thayer Ohm  States pt is doing well, no C/O of any  Symptoms. Thayer Ohm is aware PT'S Ekg SHOWS  HAVING SOME PRMATURE VENTRICULAR CONTRACTIONS. Thayer Ohm verbalized understanding.

## 2012-07-15 NOTE — Telephone Encounter (Signed)
New Prob      Pts heart rate is irregular and in the 50's. Would like to speak to nurse regarding this.

## 2012-07-17 ENCOUNTER — Telehealth: Payer: Self-pay | Admitting: Cardiology

## 2012-07-17 NOTE — Telephone Encounter (Signed)
Spoke with Thayer Ohm from [facility] who states patient c/o dizziness when he goes from sitting to standing.  Chris checked patient's BP and states it is 140/74 with HR of 72 sitting and BP 120/60 standing but did not check patient's pulse at standing.  Thayer Ohm states that the patient is well hydrated and denies any other symptoms.  She states the patient's heart rate continues to be irregular.  I will review BPs with Dr. Graciela Husbands, DOD and will advise accordingly.

## 2012-07-17 NOTE — Telephone Encounter (Signed)
New Prob     States pt is having orthostatic hypotension 140/74 to 120/60. Would like to speak to nurse.

## 2012-07-17 NOTE — Telephone Encounter (Signed)
Called patient to inform him that Dr. Graciela Husbands, DOD advises that patient come in tomorrow for orthostatic BP check with the nurse.  I informed patient that Dr. Graciela Husbands wants to make certain that orthostatics are checked appropriately, which includes having him lay, sit and stand and that he will need to stand for 3-5 minutes to determine if prolonged standing is causing a change.  Patient states he does continue to experience dizziness when he goes from sitting to standing. Patient verbalized understanding and will come in for nurse visit tomorrow morning.

## 2012-07-18 ENCOUNTER — Other Ambulatory Visit (INDEPENDENT_AMBULATORY_CARE_PROVIDER_SITE_OTHER): Payer: Medicare Other

## 2012-07-18 ENCOUNTER — Ambulatory Visit (INDEPENDENT_AMBULATORY_CARE_PROVIDER_SITE_OTHER): Payer: Medicare Other | Admitting: *Deleted

## 2012-07-18 VITALS — BP 108/68 | HR 70 | Ht 72.0 in | Wt 189.0 lb

## 2012-07-18 DIAGNOSIS — I951 Orthostatic hypotension: Secondary | ICD-10-CM

## 2012-07-18 DIAGNOSIS — I2510 Atherosclerotic heart disease of native coronary artery without angina pectoris: Secondary | ICD-10-CM

## 2012-07-18 DIAGNOSIS — I5022 Chronic systolic (congestive) heart failure: Secondary | ICD-10-CM

## 2012-07-18 DIAGNOSIS — E782 Mixed hyperlipidemia: Secondary | ICD-10-CM

## 2012-07-18 LAB — LIPID PANEL: HDL: 38.8 mg/dL — ABNORMAL LOW (ref >39.00)

## 2012-07-18 LAB — BASIC METABOLIC PANEL
BUN: 35 mg/dL — ABNORMAL HIGH (ref 6–23)
CO2: 27 mEq/L (ref 19–32)
Calcium: 9.3 mg/dL (ref 8.4–10.5)
Creatinine, Ser: 2 mg/dL — ABNORMAL HIGH (ref 0.4–1.5)
Glucose, Bld: 60 mg/dL — ABNORMAL LOW (ref 70–99)

## 2012-07-18 LAB — HEPATIC FUNCTION PANEL
ALT: 21 U/L (ref 0–53)
Bilirubin, Direct: 0 mg/dL (ref 0.0–0.3)
Total Protein: 6.8 g/dL (ref 6.0–8.3)

## 2012-07-18 MED ORDER — AMLODIPINE BESYLATE 5 MG PO TABS: 5.0000 mg | ORAL_TABLET | Freq: Every day | ORAL | Status: DC

## 2012-07-18 NOTE — Progress Notes (Signed)
Pt in for orthostatic BP check. Pt C/O of dizziness after taking his scheduled AM medications. BP check as order; Lying on left arm 108/68, sitting BP 102/54 standing for 2 minutes 79/52, 3 minutes 85/50, 5 minutes 80/50/ pulse 70 to 76 beats/minute. Dr [place] DOD recommended for pt to decrease the Norvasc dose to 5 mg once a day, and to return in a week for BP check. Pt states he has an appointment to see Tereso Newcomer PA on 07/31/12. Pt states that It cost a lot to come every week to be seen Pt agreed to call next week if he continues having with dizziness after taking his AM medication. Pt verbalized understanding.

## 2012-07-18 NOTE — Patient Instructions (Addendum)
Pt to decrease the Norvasc medication to 5 mg once a day. Pt  to call in a week if he continues having  dizziness after taking his AM medications.

## 2012-07-18 NOTE — Progress Notes (Deleted)
Pt in for orthostatic BP check . Pt C/O of dizziness in the mornings after taking his AM medications. BP check as order per Dr. Graciela Husbands DOD on 07/17/12. Pt's BP lying on left arm ulse 70 beats/minute.

## 2012-07-21 LAB — HEPATIC FUNCTION PANEL
AST: 19 U/L (ref 0–37)
Alkaline Phosphatase: 71 U/L (ref 39–117)
Total Bilirubin: 0.5 mg/dL (ref 0.3–1.2)

## 2012-07-21 LAB — LIPID PANEL
Total CHOL/HDL Ratio: 5
Triglycerides: 129 mg/dL (ref 0.0–149.0)

## 2012-07-28 ENCOUNTER — Telehealth: Payer: Self-pay | Admitting: Nurse Practitioner

## 2012-07-28 DIAGNOSIS — E7800 Pure hypercholesterolemia, unspecified: Secondary | ICD-10-CM

## 2012-07-28 MED ORDER — HYDRALAZINE HCL 25 MG PO TABS: ORAL_TABLET | ORAL | Status: DC

## 2012-07-28 NOTE — Telephone Encounter (Signed)
Message copied by Levi Aland on Mon Jul 28, 2012  4:47 PM ------      Message from: Laurey Morale      Created: Fri Jul 18, 2012 10:54 PM       Stable creatinine.  LDL is too high.  It is important for him to take his statin.  Is he compliant with his current statin (need to know this before changing dose)? ------

## 2012-07-28 NOTE — Telephone Encounter (Signed)
Called patient to review labs and plan of care.  Patient reports that he is taking Atorvastatin 80 mg daily. Patient is scheduled to come in for repeat lipids 10/15.  Patient reviewed understanding of plan of care and expressed gratitude.  Patient has appt with Tereso Newcomer, PA-C on 8/18

## 2012-07-28 NOTE — Telephone Encounter (Signed)
Spoke with pt, he reports he has a script for hydralazine 25 mg three tablets three times daily. He is only taking one tablet TID. He says if he takes three tablets he is unable to function due to dizziness. He has not tried two tablets. He reports his bp on one tablet is in the 160's. Aware dr Shirlee Latch not in the office today. The pt will cont on one tablet TID, will forward for dr mclean's review.

## 2012-07-28 NOTE — Telephone Encounter (Signed)
New Prob     Pt has some questions regarding HYDRALAZINE. Please call.

## 2012-07-28 NOTE — Telephone Encounter (Signed)
Try to increase to 2 tablets (50 mg) three times a day

## 2012-07-28 NOTE — Telephone Encounter (Signed)
Patient aware and will increase his medications to 50mg  tid and let us know if he starts to experience any dizziness as he did with 75mg  tid.

## 2012-07-28 NOTE — Telephone Encounter (Signed)
New Prob ° ° ° ° °Pt returning call from earlier. Please call back. °

## 2012-07-28 NOTE — Telephone Encounter (Signed)
Spoke with [facility] and gave her Dr [name] recommendations and also called patient and left him a message regarding recommendations.  I have asked that he call back to clarify instructions

## 2012-07-30 ENCOUNTER — Telehealth: Payer: Self-pay | Admitting: Cardiology

## 2012-07-30 NOTE — Telephone Encounter (Signed)
Increase carvedilol to 25 mg b.i.d.

## 2012-07-30 NOTE — Telephone Encounter (Signed)
New problem   Joshua Vincent/[facility] stated pt bp before he takes his meds is running 200/100 to 186/105 but  After he takes his meds it is running 140/80. Please call

## 2012-07-30 NOTE — Telephone Encounter (Signed)
Spoke with Thayer Ohm. She states as far as she knows patient is taking his medication regularly  as prescribed. She will check BP again tomorrow at time of visit with patient.

## 2012-07-31 ENCOUNTER — Ambulatory Visit (INDEPENDENT_AMBULATORY_CARE_PROVIDER_SITE_OTHER): Payer: Medicare Other | Admitting: *Deleted

## 2012-07-31 ENCOUNTER — Ambulatory Visit: Payer: Self-pay | Admitting: Physician Assistant

## 2012-07-31 DIAGNOSIS — I10 Essential (primary) hypertension: Secondary | ICD-10-CM

## 2012-07-31 DIAGNOSIS — E7800 Pure hypercholesterolemia, unspecified: Secondary | ICD-10-CM

## 2012-07-31 LAB — BASIC METABOLIC PANEL
BUN: 28 mg/dL — ABNORMAL HIGH (ref 6–23)
Calcium: 8.7 mg/dL (ref 8.4–10.5)
Creatinine, Ser: 1.8 mg/dL — ABNORMAL HIGH (ref 0.4–1.5)
GFR: 39.21 mL/min — ABNORMAL LOW (ref >60.00)

## 2012-08-01 MED ORDER — CARVEDILOL 12.5 MG PO TABS: ORAL_TABLET | ORAL | Status: DC

## 2012-08-01 NOTE — Telephone Encounter (Signed)
Spoke with patient. Pt states he has only been taking coreg (carvedilol) 12.5mg  bid not 18.75mg  bid. Per Dr Radene Gunning pt only taking coreg 12.5mg  two times a day pt to increase coreg to 18.75mg  two times a day. Pt advised, verbalized understanding.

## 2012-08-04 ENCOUNTER — Telehealth: Payer: Self-pay | Admitting: Cardiology

## 2012-08-04 NOTE — Telephone Encounter (Signed)
Spoke with [place] from [facility]. She states pt's weight was up 4 pounds over the weekend but he lost 4 pounds today so he is back to baseline. The reported BP was this morning before pt took his meds. She is going to follow up BP today after pt takes his meds. She is requesting increase in frequency of home health visits from weekly to 2 times weekly. I will forward to Dr Shirlee Latch for review.

## 2012-08-04 NOTE — Telephone Encounter (Signed)
New problem    Chris/[facility] stated she has pt on tele monitoring and pt has gained 4 lbs but has lost it and bp 202/111. She wants to see pt more frequently because she doesn't think pt understands what is going on. Please call Thayer Ohm

## 2012-08-04 NOTE — Telephone Encounter (Signed)
Definitely have her see patient more frequently.  He has limited insight.  Need to check BP once he has taken all his meds.

## 2012-08-04 NOTE — Telephone Encounter (Signed)
LM for Thayer Ohm on voice ID voice mail with Dr [name] recommendations.

## 2012-08-06 NOTE — Telephone Encounter (Signed)
Spoke with patient. Pt states home health nurse was at his house today. He said the home health nurse checked his BP and he thought it was better than it had been.

## 2012-08-09 ENCOUNTER — Telehealth: Payer: Self-pay | Admitting: Physician Assistant

## 2012-08-09 NOTE — Telephone Encounter (Signed)
Joshua Vincent is a 78 y.o. male with systolic CHF and CAD not amenable to PCI. HHRN called with 10 lb weight gain since 7/28 (190 => 200 lbs). She states patient ate high salt meal at [facility]. He notes increased LE edema. Denies dyspnea, CP, cough.  Increase Lasix to 80 mg in A and 40 mg in P over next few days. HHRN to draw a BMET Monday 08/11/12. Patient will call office 08/11/12 for update on his weight and symptoms.  Further dosing of Lasix will be made at that time.  Signed,  Tereso Newcomer, PA-C   08/09/2012 11:49 AM

## 2012-08-10 NOTE — Telephone Encounter (Signed)
Keep Lasix at 80 qam, 40 qpm with BMET on Wednesday and followup in the office asap.

## 2012-08-11 ENCOUNTER — Telehealth: Payer: Self-pay | Admitting: Cardiology

## 2012-08-11 ENCOUNTER — Encounter: Payer: Self-pay | Admitting: Cardiology

## 2012-08-11 NOTE — Telephone Encounter (Signed)
Ne problem    C/O  202/111 on  7/24 . Yesterday  195 /102 . Today  196 /95.   Home health Spoke with patient to insure if he was taken his medication . Patient stated he will be out of his medication on Wednesday won't be able to refill until 8/13  .     Oxygen  SATs is fine. Weigh slowing coming up  190 range. Today 197. Over the weekend weight was over 200 range.

## 2012-08-11 NOTE — Telephone Encounter (Signed)
See phone note 08/09/12

## 2012-08-11 NOTE — Telephone Encounter (Signed)
LMTCB

## 2012-08-11 NOTE — Telephone Encounter (Signed)
This is copied from 08/11/12 phone note from [facility] nurse.   C/O 202/111 on 7/24 . Yesterday 195 /102 . Today 196 /95. Home health Spoke with patient to insure if he was taken his medication . Patient stated he will be out of his medication on Wednesday won't be able to refill until 8/13 .  Oxygen SATs is fine. Weigh slowing coming up 190 range. Today 197. Over the weekend weight was over 200 range.   08/11/12 I spoke with patient. Pt states the BP readings are taken in the mornings before he takes his medication. Pt states he thinks his weight on 08/09/12 was 200 and today his weight was 197.6. I confirmed that pt will continue lasix 80mg  in the AM and 40mg  in the PM for now.

## 2012-08-11 NOTE — Telephone Encounter (Signed)
I spoke with [facility] nurse. They will draw BMET tomorrow when they see pt.

## 2012-08-14 ENCOUNTER — Telehealth: Payer: Self-pay | Admitting: Nurse Practitioner

## 2012-08-14 NOTE — Telephone Encounter (Signed)
Received call from [facility] stating that Joshua Vincent is up 5 lbs over the past two days.  She's noted diminished breath sounds in the left base with increasing abd girth and possibly an S3 on exam.  He is not symptomatic @ rest but does become dyspneic with activity.  He has been taking lasix 80 in the AM and 40 in the PM after a phone call for weight gain over the weekend.  I rec that he take 80mg  of lasix tonight.  He has an appt tomorrow in the office.  If he becomes more symptomatic overnight, then he should call EMS.  HHRN verbalized understanding.

## 2012-08-14 NOTE — Telephone Encounter (Signed)
Received call from [facility], patient care supervisor from [facility] who states she is concerned about patient's weight gain and BP and wants to make Dr. Shirlee Latch.  Amber states she went to patient's home today to obtain a BMET per Dr. [name] order and the patient's weight today is 201 lb, which is an increase from 190's last week.  Amber states patient's BP today is 206/98 and that patient will run out of his BP medicine on Wednesday.  I advised Amber that patient has an appointment tomorrow (8/8) with Norma Fredrickson, NP.  Amber expressed gratitude.

## 2012-08-15 ENCOUNTER — Encounter: Payer: Self-pay | Admitting: Nurse Practitioner

## 2012-08-15 ENCOUNTER — Telehealth: Payer: Self-pay | Admitting: Cardiology

## 2012-08-15 ENCOUNTER — Other Ambulatory Visit: Payer: Self-pay | Admitting: *Deleted

## 2012-08-15 ENCOUNTER — Ambulatory Visit (INDEPENDENT_AMBULATORY_CARE_PROVIDER_SITE_OTHER): Payer: Medicare Other | Admitting: Nurse Practitioner

## 2012-08-15 VITALS — BP 120/62 | HR 64 | Ht 71.0 in | Wt 201.6 lb

## 2012-08-15 DIAGNOSIS — R0989 Other specified symptoms and signs involving the circulatory and respiratory systems: Secondary | ICD-10-CM

## 2012-08-15 DIAGNOSIS — I5043 Acute on chronic combined systolic (congestive) and diastolic (congestive) heart failure: Secondary | ICD-10-CM

## 2012-08-15 DIAGNOSIS — R0600 Dyspnea, unspecified: Secondary | ICD-10-CM

## 2012-08-15 DIAGNOSIS — I509 Heart failure, unspecified: Secondary | ICD-10-CM

## 2012-08-15 DIAGNOSIS — E875 Hyperkalemia: Secondary | ICD-10-CM

## 2012-08-15 DIAGNOSIS — R0609 Other forms of dyspnea: Secondary | ICD-10-CM

## 2012-08-15 DIAGNOSIS — I2510 Atherosclerotic heart disease of native coronary artery without angina pectoris: Secondary | ICD-10-CM

## 2012-08-15 LAB — BASIC METABOLIC PANEL
BUN: 25 mg/dL — ABNORMAL HIGH (ref 6–23)
CO2: 25 mEq/L (ref 19–32)
Calcium: 8.9 mg/dL (ref 8.4–10.5)
Chloride: 105 mEq/L (ref 96–112)
Creatinine, Ser: 2.2 mg/dL — ABNORMAL HIGH (ref 0.4–1.5)
GFR: 31.27 mL/min — ABNORMAL LOW (ref >60.00)
Glucose, Bld: 136 mg/dL — ABNORMAL HIGH (ref 70–99)
Potassium: 5 mEq/L (ref 3.5–5.1)
Sodium: 139 mEq/L (ref 135–145)

## 2012-08-15 LAB — BRAIN NATRIURETIC PEPTIDE: Pro B Natriuretic peptide (BNP): 646 pg/mL — ABNORMAL HIGH (ref 0.0–100.0)

## 2012-08-15 MED ORDER — FUROSEMIDE 40 MG PO TABS: ORAL_TABLET | ORAL | Status: DC

## 2012-08-15 MED ORDER — CARVEDILOL 12.5 MG PO TABS: ORAL_TABLET | ORAL | Status: DC

## 2012-08-15 NOTE — Patient Instructions (Addendum)
We need to check labs today  Follow this list of medicines given to you today. Let your son in [place] help you with paying for your medicines.   Take your Coreg 12. 5mg  two times a day  Take the Lasix 2 pills two times a day for the next 3 days - then back to 1 pill two times a day  Keep weighing daily  Avoid salt  See Dr. Shirlee Latch in 2 to 3 weeks  We will try to get a social worker to see you thru [facility]  Call the [facility] office at [phone] if you have any questions, problems or concerns.

## 2012-08-15 NOTE — Telephone Encounter (Signed)
Will forward so this can be addressed while the pt is here.

## 2012-08-15 NOTE — Telephone Encounter (Signed)
New Prob      Wanted to notify nurse that pt in coming into the office today with CHF and  will be running out of his prescriptions and will not have medication for several days next week. Pt will be in seeing Lawson Fiscal today. Nurse is also requesting social work consults for pt. Please call.

## 2012-08-15 NOTE — Telephone Encounter (Signed)
S/w Myrene Buddy at [facility] and stated pt needs a Child psychotherapist to come out to pt's house Myrene Buddy stated she will put the order in and I didn't need to do anything else I asked again if Myrene Buddy needed anything from me she stated no

## 2012-08-15 NOTE — Progress Notes (Signed)
Joshua Vincent Date of Birth: [birth year] Medical Record #[id number]  History of Present Illness: Joshua Vincent is seen back today for a follow up visit. Seen for Dr. Shirlee Latch. Has multiple medical issues which include HTN, HLD, BPH, type 2 DM, OA, past tobacco abuse, CAD with past CABG with LIMA to LAD, SVG to DX, SVG to OM and SVG to PL. Had NSTEMI in August of 2013 - had occluded SVG to PL and SVG to DX. EF was 40% - no culprit lesion for PCI and was managed medically. Had recurrent NSTEMI in June of 2014 - stable anatomy per cath and was medically treated. EF has dropped to 15% per his last echo in June of 2014. Other issues include bladder cancer, PVCs, iron deficiency anemia, obesity, CKD, and mild to moderate MR.  Was seen by Dr. Antoine Poche for [facility] a month ago. Weight was up slightly and his lasix was increased for a few days.   Has had numerous phone calls since his last visit here. BP is up. Weight is up. He is going to run out of his medicines. Home health sees him - requesting a Child psychotherapist to see. He admits to using less insulin because he is going to run out. Only taking 12.5 mg of Coreg in the am and 6.25 mg in the pm - not the 18.75 mg two times a day as prescribed.   He comes in today. Here alone. Poor insight into his condition. Not taking his medicines right - mainly because his supply will run out before he gets his social security check next Wednesday. Took 5 pills of Lasix yesterday. Weight was up to 202 but down to 197 this morning on his Vincent. Says he is not too short of breath. Trying to watch salt - but difficult to quanitfy. No chest pain. BP is better here today. Using less insulin and sugars are "sweet".   Current Outpatient Prescriptions  Medication Sig Dispense Refill  . amLODipine (NORVASC) 5 MG tablet Take 1 tablet (5 mg total) by mouth daily.  30 tablet  6  . aspirin EC 81 MG tablet Take 81 mg by mouth daily.      Marland Kitchen atorvastatin (LIPITOR) 80 MG tablet Take 1 tablet  (80 mg total) by mouth at bedtime.  30 tablet  6  . carvedilol (COREG) 12.5 MG tablet Take one tablet two times a day  90 tablet  3  . Cholecalciferol (VITAMIN D PO) Take 1 tablet by mouth daily.      . clopidogrel (PLAVIX) 75 MG tablet Take 1 tablet (75 mg total) by mouth daily with breakfast.  30 tablet  3  . Coenzyme Q10 (COQ-10) 100 MG CAPS Take 1 capsule by mouth daily.      Marland Kitchen doxazosin (CARDURA) 8 MG tablet Take 1 tablet (8 mg total) by mouth at bedtime.  90 tablet  3  . furosemide (LASIX) 40 MG tablet Take 2 pills two times a day for 3 days, then decrease to just one pill two times a day - morning and early afternoon.  30 tablet    . hydrALAZINE (APRESOLINE) 25 MG tablet Take 2 tablets by mouth three times daily  270 tablet  3  . Insulin Glargine (LANTUS SOLOSTAR [place]) Inject 25 Units into the skin daily.       . isosorbide mononitrate (IMDUR) 60 MG 24 hr tablet Take 1.5 tablets (90 mg total) by mouth daily.  45 tablet  3  . nitroGLYCERIN (NITROSTAT)  0.4 MG SL tablet Place 1 tablet (0.4 mg total) under the tongue every 5 (five) minutes as needed. For chest pain  25 tablet  3  . potassium chloride SA (K-DUR,KLOR-CON) 20 MEQ tablet Take 1 tablet (20 mEq total) by mouth 2 (two) times daily.  60 tablet  3   No current facility-administered medications for this visit.    Allergies  Allergen Reactions  . Niacin Itching    Past Medical History  Diagnosis Date  . HTN (hypertension)   . HLD (hyperlipidemia)   . Benign prostatic hypertrophy     Hx of urinary retention as well.  . Diabetes mellitus, type 2   . Osteoarthrosis, unspecified whether generalized or localized, shoulder region   . History of tobacco abuse     quit 1992  . CAD (coronary artery disease)     a. 4v CABG (1/11): LIMA LAD, SVG-D, SVG-OM, SVG-PLV. b. NSTEMI 08/2011 cath  revealed severe native CAD, patent SVG to OM & LIMA to LAD, occluded SVG to RCA and SVG to Diagonal, EF 40%, no culprit lesion for PCI, med therapy - f/u  EF 55-60%. c. NSTEMI 06/2012 - stable anatomy by cath, med rx recommended.  . CVA (cerebral infarction)     peri-catheterization in 12/10.  Patient initially developed double vision and MRI showed right dorsal mid-brain infarct.  Carotid dopplers with no significant disease.  Double vision has resolved.    . Systolic and diastolic CHF, acute on chronic     a. EF 40% by cath 08/2011; EF 55-60% by echo 09/2011. b. EF drop to 15% by echo 06/2012.  . Bladder cancer     s/p resection 7/11  . PVC's (premature ventricular contractions)     3 week  event monitor 8/11: no signifiant arrhythmias, occ PVCs  . Iron deficiency anemia   . Leg wound, left 08/2011  . Obesity   . Chronic kidney disease     hx of kidney stones  . CKD (chronic kidney disease)     a. Baseline Cr 1.4. b. AKI 06/2012 with Cr at discharge around 2.2.  . Mitral regurgitation     a. Mild-mod by echo 07/01/12.    Past Surgical History  Procedure Laterality Date  . Appendectomy    . Transurethral resection of prostate    . Coronary artery bypass graft  2011  . Retinal detachment surgery    . Eye surgery  March 20 2012    History  Smoking status  . Former Smoker -- 2.00 packs/day for 15 years  . Types: Cigarettes  . Quit date: 04/03/1990  Smokeless tobacco  . Not on file    Comment: patient states they just burned more than actually smoked    History  Alcohol Use  . Yes    Comment: occasionally    Family History  Problem Relation Age of Onset  . Aneurysm Father     Father died at age 37 from an embolic cerebrovascular accident from a deep vein thrombosis in his lower extremity.  . Osteoarthritis Sister   . Heart disease Sister   . Aneurysm Brother     Review of Systems: The review of systems is per the HPI.  All other systems were reviewed and are negative.  Physical Exam: BP 120/62  Pulse 64  Ht 5\' 11"  (1.803 m)  Wt 201 lb 9.6 oz (91.445 kg)  BMI 28.13 kg/m2 Patient is alert and in no acute distress.  Little inappropriate affect. Poor insight into  his problems. Laughs a lot. Skin is warm and dry. Color is normal.  HEENT is unremarkable. Normocephalic/atraumatic. PERRL. Sclera are nonicteric. Neck is supple. No masses. No JVD. Lungs are clear. Cardiac exam shows a regular rate and rhythm. Heart tones are quite distant. Abdomen is soft. Extremities are with 1+t edema. Gait and ROM are intact. No gross neurologic deficits noted.  LABORATORY DATA: PENDING   Lab Results  Component Value Date   WBC 6.4 07/09/2012   HGB 12.5* 07/09/2012   HCT 37.0* 07/09/2012   PLT 234 07/09/2012   GLUCOSE 207* 07/31/2012   CHOL 204* 07/18/2012   CHOL 191 07/18/2012   TRIG 137.0 07/18/2012   TRIG 129.0 07/18/2012   HDL 38.80* 07/18/2012   HDL 37.10* 07/18/2012   LDLDIRECT 138.7 07/18/2012   LDLCALC 128* 07/18/2012   ALT 21 07/18/2012   ALT 17 07/18/2012   AST 14 07/18/2012   AST 19 07/18/2012   NA 136 07/31/2012   K 3.9 07/31/2012   CL 105 07/31/2012   CREATININE 1.8* 07/31/2012   BUN 28* 07/31/2012   CO2 24 07/31/2012   TSH 2.745 06/30/2012   INR 0.98 09/21/2011   HGBA1C 6.9* 06/30/2012   Echo Study Conclusions from June 2014  - Left ventricle: The cavity size was normal. Wall thickness was increased in a pattern of moderate LVH. The estimated ejection fraction was 15%. Diffuse hypokinesis. Features are consistent with a pseudonormal left ventricular filling pattern, with concomitant abnormal relaxation and increased filling pressure (grade 2 diastolic dysfunction). - Mitral valve: Mild to moderate regurgitation. - Left atrium: The atrium was mildly dilated. - Right ventricle: Systolic function was moderately reduced.    Assessment / Plan: 1. Systolic heart failure - some volume overload on exam. Probably with Class II/III symptoms. Salt use questionable. Biggest issue is cost. He has a son in [place] who he says he has called for financial assistance. Lasix is increased to 80 mg BID for 3 days, then back to 40 mg  BID. Check BNP and BMET today. Needs to get back to see Dr. Shirlee Latch.   2. CAD - stable anatomy per recent cath - managed medically  3. HTN - bp is better here today.   4. DM - using less insulin due to cost.  His social issues are what is going to limit how well he is able to do long term. Will try to get a Child psychotherapist to see him. May have better luck with assistance thru the CHF clinic.   Patient is agreeable to this plan and will call if any problems develop in the interim.   Joshua Macadamia, RN, ANP-C [facility] [address] [place], [place]  [address]

## 2012-08-15 NOTE — Progress Notes (Signed)
Thanks for seeing him.  He is not so great about taking his meds.  Let's have him see me in the CHF clinic and then maybe we can arrange for more help with his meds.

## 2012-08-18 ENCOUNTER — Inpatient Hospital Stay (HOSPITAL_COMMUNITY)
Admission: EM | Admit: 2012-08-18 | Discharge: 2012-08-20 | DRG: 069 | Disposition: A | Discharge status: Home / Self Care | Payer: Medicare Other | Attending: Internal Medicine | Admitting: Internal Medicine

## 2012-08-18 ENCOUNTER — Emergency Department (HOSPITAL_COMMUNITY): Payer: Medicare Other

## 2012-08-18 ENCOUNTER — Encounter (HOSPITAL_COMMUNITY): Payer: Self-pay | Admitting: Emergency Medicine

## 2012-08-18 DIAGNOSIS — Z87891 Personal history of nicotine dependence: Secondary | ICD-10-CM

## 2012-08-18 DIAGNOSIS — I25810 Atherosclerosis of coronary artery bypass graft(s) without angina pectoris: Secondary | ICD-10-CM | POA: Diagnosis present

## 2012-08-18 DIAGNOSIS — E669 Obesity, unspecified: Secondary | ICD-10-CM | POA: Diagnosis present

## 2012-08-18 DIAGNOSIS — Z8551 Personal history of malignant neoplasm of bladder: Secondary | ICD-10-CM

## 2012-08-18 DIAGNOSIS — I214 Non-ST elevation (NSTEMI) myocardial infarction: Secondary | ICD-10-CM

## 2012-08-18 DIAGNOSIS — R55 Syncope and collapse: Secondary | ICD-10-CM

## 2012-08-18 DIAGNOSIS — Z6825 Body mass index (BMI) 25.0-25.9, adult: Secondary | ICD-10-CM

## 2012-08-18 DIAGNOSIS — N400 Enlarged prostate without lower urinary tract symptoms: Secondary | ICD-10-CM | POA: Diagnosis present

## 2012-08-18 DIAGNOSIS — I6350 Cerebral infarction due to unspecified occlusion or stenosis of unspecified cerebral artery: Secondary | ICD-10-CM

## 2012-08-18 DIAGNOSIS — I252 Old myocardial infarction: Secondary | ICD-10-CM

## 2012-08-18 DIAGNOSIS — I739 Peripheral vascular disease, unspecified: Secondary | ICD-10-CM | POA: Diagnosis present

## 2012-08-18 DIAGNOSIS — E871 Hypo-osmolality and hyponatremia: Secondary | ICD-10-CM

## 2012-08-18 DIAGNOSIS — J984 Other disorders of lung: Secondary | ICD-10-CM

## 2012-08-18 DIAGNOSIS — I6529 Occlusion and stenosis of unspecified carotid artery: Secondary | ICD-10-CM | POA: Diagnosis present

## 2012-08-18 DIAGNOSIS — E785 Hyperlipidemia, unspecified: Secondary | ICD-10-CM | POA: Diagnosis present

## 2012-08-18 DIAGNOSIS — Z794 Long term (current) use of insulin: Secondary | ICD-10-CM

## 2012-08-18 DIAGNOSIS — I5043 Acute on chronic combined systolic (congestive) and diastolic (congestive) heart failure: Secondary | ICD-10-CM

## 2012-08-18 DIAGNOSIS — I5032 Chronic diastolic (congestive) heart failure: Secondary | ICD-10-CM | POA: Diagnosis present

## 2012-08-18 DIAGNOSIS — E876 Hypokalemia: Secondary | ICD-10-CM

## 2012-08-18 DIAGNOSIS — E119 Type 2 diabetes mellitus without complications: Secondary | ICD-10-CM

## 2012-08-18 DIAGNOSIS — I169 Hypertensive crisis, unspecified: Secondary | ICD-10-CM

## 2012-08-18 DIAGNOSIS — I129 Hypertensive chronic kidney disease with stage 1 through stage 4 chronic kidney disease, or unspecified chronic kidney disease: Secondary | ICD-10-CM | POA: Diagnosis present

## 2012-08-18 DIAGNOSIS — I509 Heart failure, unspecified: Secondary | ICD-10-CM | POA: Diagnosis present

## 2012-08-18 DIAGNOSIS — I2510 Atherosclerotic heart disease of native coronary artery without angina pectoris: Secondary | ICD-10-CM | POA: Diagnosis present

## 2012-08-18 DIAGNOSIS — N179 Acute kidney failure, unspecified: Secondary | ICD-10-CM

## 2012-08-18 DIAGNOSIS — D649 Anemia, unspecified: Secondary | ICD-10-CM

## 2012-08-18 DIAGNOSIS — I658 Occlusion and stenosis of other precerebral arteries: Secondary | ICD-10-CM | POA: Diagnosis present

## 2012-08-18 DIAGNOSIS — M79609 Pain in unspecified limb: Secondary | ICD-10-CM

## 2012-08-18 DIAGNOSIS — N189 Chronic kidney disease, unspecified: Secondary | ICD-10-CM | POA: Diagnosis present

## 2012-08-18 DIAGNOSIS — R4701 Aphasia: Secondary | ICD-10-CM | POA: Diagnosis present

## 2012-08-18 DIAGNOSIS — Z7982 Long term (current) use of aspirin: Secondary | ICD-10-CM

## 2012-08-18 DIAGNOSIS — I10 Essential (primary) hypertension: Secondary | ICD-10-CM | POA: Diagnosis present

## 2012-08-18 DIAGNOSIS — Z8673 Personal history of transient ischemic attack (TIA), and cerebral infarction without residual deficits: Secondary | ICD-10-CM

## 2012-08-18 DIAGNOSIS — J189 Pneumonia, unspecified organism: Secondary | ICD-10-CM

## 2012-08-18 DIAGNOSIS — I6789 Other cerebrovascular disease: Secondary | ICD-10-CM | POA: Diagnosis present

## 2012-08-18 DIAGNOSIS — I4949 Other premature depolarization: Secondary | ICD-10-CM | POA: Diagnosis present

## 2012-08-18 DIAGNOSIS — E1149 Type 2 diabetes mellitus with other diabetic neurological complication: Secondary | ICD-10-CM

## 2012-08-18 DIAGNOSIS — I059 Rheumatic mitral valve disease, unspecified: Secondary | ICD-10-CM | POA: Diagnosis present

## 2012-08-18 DIAGNOSIS — E1142 Type 2 diabetes mellitus with diabetic polyneuropathy: Secondary | ICD-10-CM | POA: Diagnosis present

## 2012-08-18 DIAGNOSIS — G459 Transient cerebral ischemic attack, unspecified: Principal | ICD-10-CM | POA: Diagnosis present

## 2012-08-18 DIAGNOSIS — E118 Type 2 diabetes mellitus with unspecified complications: Secondary | ICD-10-CM | POA: Diagnosis present

## 2012-08-18 DIAGNOSIS — Z79899 Other long term (current) drug therapy: Secondary | ICD-10-CM

## 2012-08-18 DIAGNOSIS — Z87898 Personal history of other specified conditions: Secondary | ICD-10-CM

## 2012-08-18 DIAGNOSIS — I5033 Acute on chronic diastolic (congestive) heart failure: Secondary | ICD-10-CM

## 2012-08-18 DIAGNOSIS — M19019 Primary osteoarthritis, unspecified shoulder: Secondary | ICD-10-CM | POA: Diagnosis present

## 2012-08-18 DIAGNOSIS — G4733 Obstructive sleep apnea (adult) (pediatric): Secondary | ICD-10-CM | POA: Diagnosis present

## 2012-08-18 LAB — COMPREHENSIVE METABOLIC PANEL
ALT: 11 U/L (ref 0–53)
Alkaline Phosphatase: 76 U/L (ref 39–117)
BUN: 29 mg/dL — ABNORMAL HIGH (ref 6–23)
CO2: 22 mEq/L (ref 19–32)
Chloride: 104 mEq/L (ref 96–112)
GFR calc Af Amer: 33 mL/min — ABNORMAL LOW (ref >90)
Glucose, Bld: 250 mg/dL — ABNORMAL HIGH (ref 70–99)
Potassium: 4.2 mEq/L (ref 3.5–5.1)
Total Bilirubin: 0.4 mg/dL (ref 0.3–1.2)

## 2012-08-18 LAB — CBC
Hemoglobin: 11.3 g/dL — ABNORMAL LOW (ref 13.0–17.0)
MCH: 28.9 pg (ref 26.0–34.0)
RBC: 3.91 MIL/uL — ABNORMAL LOW (ref 4.22–5.81)
WBC: 5.8 10*3/uL (ref 4.0–10.5)

## 2012-08-18 LAB — PROTIME-INR: INR: 1.07 (ref 0.00–1.49)

## 2012-08-18 LAB — TROPONIN I: Troponin I: <0.3 ng/mL (ref <0.30)

## 2012-08-18 LAB — DIFFERENTIAL
Eosinophils Absolute: 0.1 10*3/uL (ref 0.0–0.7)
Lymphocytes Relative: 21 % (ref 12–46)
Lymphs Abs: 1.2 10*3/uL (ref 0.7–4.0)
Monocytes Relative: 7 % (ref 3–12)
Neutro Abs: 4.1 10*3/uL (ref 1.7–7.7)
Neutrophils Relative %: 70 % (ref 43–77)

## 2012-08-18 MED ORDER — SODIUM CHLORIDE 0.9 % IV SOLN: 250.0000 mL | INTRAVENOUS | Status: DC | PRN

## 2012-08-18 MED ORDER — AMLODIPINE BESYLATE 5 MG PO TABS
5.0000 mg | ORAL_TABLET | Freq: Every day | ORAL | Status: DC
  Administered 2012-08-18 – 2012-08-20 (×3): 5 mg via ORAL
  Filled 2012-08-18 (×3): qty 1

## 2012-08-18 MED ORDER — ATORVASTATIN CALCIUM 80 MG PO TABS
80.0000 mg | ORAL_TABLET | Freq: Every day | ORAL | Status: DC
  Administered 2012-08-18 – 2012-08-19 (×2): 80 mg via ORAL
  Filled 2012-08-18 (×3): qty 1

## 2012-08-18 MED ORDER — POTASSIUM CHLORIDE CRYS ER 20 MEQ PO TBCR
20.0000 meq | EXTENDED_RELEASE_TABLET | Freq: Two times a day (BID) | ORAL | Status: DC
  Administered 2012-08-18 – 2012-08-20 (×4): 20 meq via ORAL
  Filled 2012-08-18 (×5): qty 1

## 2012-08-18 MED ORDER — ISOSORBIDE MONONITRATE ER 60 MG PO TB24
90.0000 mg | ORAL_TABLET | Freq: Every day | ORAL | Status: DC
  Administered 2012-08-18 – 2012-08-20 (×3): 90 mg via ORAL
  Filled 2012-08-18 (×3): qty 1

## 2012-08-18 MED ORDER — ASPIRIN EC 81 MG PO TBEC
81.0000 mg | DELAYED_RELEASE_TABLET | Freq: Every day | ORAL | Status: DC
  Administered 2012-08-18 – 2012-08-20 (×3): 81 mg via ORAL
  Filled 2012-08-18 (×3): qty 1

## 2012-08-18 MED ORDER — CLOPIDOGREL BISULFATE 75 MG PO TABS
75.0000 mg | ORAL_TABLET | Freq: Every day | ORAL | Status: DC
  Administered 2012-08-19 – 2012-08-20 (×2): 75 mg via ORAL
  Filled 2012-08-18 (×3): qty 1

## 2012-08-18 MED ORDER — CARVEDILOL 12.5 MG PO TABS
12.5000 mg | ORAL_TABLET | Freq: Two times a day (BID) | ORAL | Status: DC
  Administered 2012-08-19 – 2012-08-20 (×3): 12.5 mg via ORAL
  Filled 2012-08-18 (×5): qty 1

## 2012-08-18 MED ORDER — DOXAZOSIN MESYLATE 8 MG PO TABS
8.0000 mg | ORAL_TABLET | Freq: Every day | ORAL | Status: DC
  Administered 2012-08-18 – 2012-08-19 (×2): 8 mg via ORAL
  Filled 2012-08-18 (×3): qty 1

## 2012-08-18 MED ORDER — SODIUM CHLORIDE 0.9 % IJ SOLN
3.0000 mL | Freq: Two times a day (BID) | INTRAMUSCULAR | Status: DC
  Administered 2012-08-18 – 2012-08-19 (×3): 3 mL via INTRAVENOUS

## 2012-08-18 MED ORDER — SODIUM CHLORIDE 0.9 % IJ SOLN: 3.0000 mL | INTRAMUSCULAR | Status: DC | PRN

## 2012-08-18 MED ORDER — FUROSEMIDE 40 MG PO TABS
40.0000 mg | ORAL_TABLET | Freq: Three times a day (TID) | ORAL | Status: DC
  Administered 2012-08-18: 40 mg via ORAL
  Filled 2012-08-18 (×4): qty 1

## 2012-08-18 NOTE — ED Notes (Signed)
Per family one hour ago pt had aphasia with un comprehensible words that is now resolved; pt sts some dizziness during event that is now resolved; pt with clear speech and no other neuro deficits

## 2012-08-18 NOTE — ED Notes (Signed)
Grandson was with pt at restaurant-- videotaped pt while he was talking- expressive aphasia present

## 2012-08-18 NOTE — H&P (Signed)
PCP:   Benita Stabile, MD   Chief Complaint:  aphasia  HPI: 78 yo male eating with a friend earlier when he suddenly could not speak, and got blurred vision.  Lasted for about an hour than resolved in ED.  No recent fevers.  No n/t arms or legs.  Now no problems, no blurred vision, speaking normally now.  No rashes.  No n/v/d.  No problem swallowing.  On a baby asa a day already and plavix.  No cp.  No sob.  No le edema or swelling.  Review of Systems:  Positive and negative as per HPI otherwise all other systems are negative  Past Medical History: Past Medical History  Diagnosis Date  . HTN (hypertension)   . HLD (hyperlipidemia)   . Benign prostatic hypertrophy     Hx of urinary retention as well.  . Diabetes mellitus, type 2   . Osteoarthrosis, unspecified whether generalized or localized, shoulder region   . History of tobacco abuse     quit 1992  . CAD (coronary artery disease)     a. 4v CABG (1/11): LIMA LAD, SVG-D, SVG-OM, SVG-PLV. b. NSTEMI 08/2011 cath  revealed severe native CAD, patent SVG to OM & LIMA to LAD, occluded SVG to RCA and SVG to Diagonal, EF 40%, no culprit lesion for PCI, med therapy - f/u EF 55-60%. c. NSTEMI 06/2012 - stable anatomy by cath, med rx recommended.  . CVA (cerebral infarction)     peri-catheterization in 12/10.  Patient initially developed double vision and MRI showed right dorsal mid-brain infarct.  Carotid dopplers with no significant disease.  Double vision has resolved.    . Systolic and diastolic CHF, acute on chronic     a. EF 40% by cath 08/2011; EF 55-60% by echo 09/2011. b. EF drop to 15% by echo 06/2012.  . Bladder cancer     s/p resection 7/11  . PVC's (premature ventricular contractions)     3 week  event monitor 8/11: no signifiant arrhythmias, occ PVCs  . Iron deficiency anemia   . Leg wound, left 08/2011  . Obesity   . Chronic kidney disease     hx of kidney stones  . CKD (chronic kidney disease)     a. Baseline Cr 1.4. b.  AKI 06/2012 with Cr at discharge around 2.2.  . Mitral regurgitation     a. Mild-mod by echo 07/01/12.   Past Surgical History  Procedure Laterality Date  . Appendectomy    . Transurethral resection of prostate    . Coronary artery bypass graft  2011  . Retinal detachment surgery    . Eye surgery  March 20 2012    Medications: Prior to Admission medications   Medication Sig Start Date End Date Taking? Authorizing Provider  amLODipine (NORVASC) 5 MG tablet Take 1 tablet (5 mg total) by mouth daily. 07/18/12 07/18/13 Yes Peter M [place], MD  aspirin EC 81 MG tablet Take 81 mg by mouth daily.   Yes Historical Provider, MD  atorvastatin (LIPITOR) 80 MG tablet Take 1 tablet (80 mg total) by mouth at bedtime. 08/23/11 08/22/12 Yes Jessica A Hope, PA-C  carvedilol (COREG) 12.5 MG tablet Take 12.5 mg by mouth 2 (two) times daily with a meal.   Yes Historical Provider, MD  Cholecalciferol (VITAMIN D PO) Take 1 tablet by mouth daily.   Yes Historical Provider, MD  clopidogrel (PLAVIX) 75 MG tablet Take 1 tablet (75 mg total) by mouth daily with breakfast. 07/09/12  Yes Dayna N Dunn, PA-C  Coenzyme Q10 (COQ-10) 100 MG CAPS Take 1 capsule by mouth daily.   Yes Historical Provider, MD  doxazosin (CARDURA) 8 MG tablet Take 1 tablet (8 mg total) by mouth at bedtime. 08/24/10  Yes Laurey Morale, MD  furosemide (LASIX) 40 MG tablet Take 40 mg by mouth 3 (three) times daily.   Yes Historical Provider, MD  Insulin Glargine (LANTUS SOLOSTAR [place]) Inject 25 Units into the skin daily.    Yes Historical Provider, MD  isosorbide mononitrate (IMDUR) 60 MG 24 hr tablet Take 1.5 tablets (90 mg total) by mouth daily. 07/09/12  Yes Dayna N Dunn, PA-C  potassium chloride SA (K-DUR,KLOR-CON) 20 MEQ tablet Take 20 mEq by mouth 2 (two) times daily. 07/09/12  Yes Dayna N Dunn, PA-C  nitroGLYCERIN (NITROSTAT) 0.4 MG SL tablet Place 0.4 mg under the tongue every 5 (five) minutes as needed for chest pain.    Historical Provider, MD     Allergies:   Allergies  Allergen Reactions  . Niacin Itching    Social History:  reports that he quit smoking about 22 years ago. His smoking use included Cigarettes. He has a 30 pack-year smoking history. He does not have any smokeless tobacco history on file. He reports that  drinks alcohol. He reports that he does not use illicit drugs.  Family History: Family History  Problem Relation Age of Onset  . Aneurysm Father     Father died at age 56 from an embolic cerebrovascular accident from a deep vein thrombosis in his lower extremity.  . Osteoarthritis Sister   . Heart disease Sister   . Aneurysm Brother     Physical Exam: Filed Vitals:   08/18/12 1745 08/18/12 1815 08/18/12 1845 08/18/12 1945  BP: 143/68 159/72 162/77 161/75  Pulse: 65 63 66 68  Temp:      TempSrc:      Resp: 19 12 16 12   SpO2: 96% 99% 96% 98%   General appearance: alert, cooperative and no distress Head: Normocephalic, without obvious abnormality, atraumatic Eyes: negative Nose: Nares normal. Septum midline. Mucosa normal. No drainage or sinus tenderness. Neck: no JVD and supple, symmetrical, trachea midline Lungs: clear to auscultation bilaterally Heart: regular rate and rhythm, S1, S2 normal, no murmur, click, rub or gallop Abdomen: soft, non-tender; bowel sounds normal; no masses,  no organomegaly Extremities: extremities normal, atraumatic, no cyanosis or edema Pulses: 2+ and symmetric Skin: Skin color, texture, turgor normal. No rashes or lesions Neurologic: Grossly normal  Labs on Admission:   Recent Labs  08/18/12 1604  NA 135  K 4.2  CL 104  CO2 22  GLUCOSE 250*  BUN 29*  CREATININE 2.16*  CALCIUM 8.6    Recent Labs  08/18/12 1604  AST 12  ALT 11  ALKPHOS 76  BILITOT 0.4  PROT 5.3*  ALBUMIN 2.4*    Recent Labs  08/18/12 1604  WBC 5.8  NEUTROABS 4.1  HGB 11.3*  HCT 32.7*  MCV 83.6  PLT 154    Recent Labs  08/18/12 1604  TROPONINI <0.30    Radiological Exams on Admission: Ct Head (brain) Wo Contrast  08/18/2012   *RADIOLOGY REPORT*  Clinical Data:aphasia  CT HEAD WITHOUT CONTRAST  Technique:  Contiguous axial images were obtained from the base of the skull through the vertex without contrast. Study was obtained within 24 hours of patient arrival at the emergency department.  Comparison: Brain MRI December 17, 2008 and brain CT December 16, 2008  Findings:  There is mild diffuse atrophy which is stable.  There is no mass, hemorrhage, extra-axial fluid collection, or midline shift.  There is patchy small vessel disease in the centra semiovale bilaterally.  Bony calvarium appears intact.  The mastoid air cells are clear.  There is mild mucosal thickening in the left maxillary antrum.  IMPRESSION: Atrophy with patchy periventricular small vessel disease.  There is no apparent mass, hemorrhage, or acute appearing infarct.  There is mild mucosal thickening in the left maxillary antrum.   Original Report Authenticated By: Bretta Bang, M.D.    Assessment/Plan  78 yo male with tia symptoms resolved with multiple risk factors  Principal Problem:   TIA on medication Active Problems:   DM   HYPERTENSION   Coronary atherosclerosis of native coronary artery   DIASTOLIC HEART FAILURE, CHRONIC   Aphasia  cva pathway and w/u.  Cont asa and plavix.  Neurology has been consulted.  Tele monitoring.  Asymptomatic at this time.  freq neuro cks.  Obs.  Full code.   Barnet Benavides A 08/18/2012, 8:15 PM

## 2012-08-18 NOTE — ED Provider Notes (Addendum)
CSN: [id number]     Arrival date & time 08/18/12  1502 History     First MD Initiated Contact with Patient 08/18/12 1522     Chief Complaint  Patient presents with  . Aphasia    resolved    HPI  78 year old male stroke symptoms. He waking this morning feeling "dizzy".  His grandson is here with him now states that they went to room Tuesday's at the mall for lunch. States it was a grandfather was "speaking gibberish". Patient does not recall having any difficulty the states he does recall that his wife had ordered for him per the patient, and the grandson's report he ate without difficulty. He denies vision changes. He denies headache. He denies palpitations or chest pain. He states that he had a stroke months ago and was admitted to the hospital he left without any sequelae. He does not recall his workup for his presenting symptoms.  Patient is followed at [facility], Dr. Blair Hailey.   Past Medical History  Diagnosis Date  . HTN (hypertension)   . HLD (hyperlipidemia)   . Benign prostatic hypertrophy     Hx of urinary retention as well.  . Diabetes mellitus, type 2   . Osteoarthrosis, unspecified whether generalized or localized, shoulder region   . History of tobacco abuse     quit 1992  . CAD (coronary artery disease)     a. 4v CABG (1/11): LIMA LAD, SVG-D, SVG-OM, SVG-PLV. b. NSTEMI 08/2011 cath  revealed severe native CAD, patent SVG to OM & LIMA to LAD, occluded SVG to RCA and SVG to Diagonal, EF 40%, no culprit lesion for PCI, med therapy - f/u EF 55-60%. c. NSTEMI 06/2012 - stable anatomy by cath, med rx recommended.  . CVA (cerebral infarction)     peri-catheterization in 12/10.  Patient initially developed double vision and MRI showed right dorsal mid-brain infarct.  Carotid dopplers with no significant disease.  Double vision has resolved.    . Systolic and diastolic CHF, acute on chronic     a. EF 40% by cath 08/2011; EF 55-60% by echo 09/2011. b. EF drop to 15% by  echo 06/2012.  . Bladder cancer     s/p resection 7/11  . PVC's (premature ventricular contractions)     3 week  event monitor 8/11: no signifiant arrhythmias, occ PVCs  . Iron deficiency anemia   . Leg wound, left 08/2011  . Obesity   . Chronic kidney disease     hx of kidney stones  . CKD (chronic kidney disease)     a. Baseline Cr 1.4. b. AKI 06/2012 with Cr at discharge around 2.2.  . Mitral regurgitation     a. Mild-mod by echo 07/01/12.   Past Surgical History  Procedure Laterality Date  . Appendectomy    . Transurethral resection of prostate    . Coronary artery bypass graft  2011  . Retinal detachment surgery    . Eye surgery  March 20 2012   Family History  Problem Relation Age of Onset  . Aneurysm Father     Father died at age 46 from an embolic cerebrovascular accident from a deep vein thrombosis in his lower extremity.  . Osteoarthritis Sister   . Heart disease Sister   . Aneurysm Brother    History  Substance Use Topics  . Smoking status: Former Smoker -- 2.00 packs/day for 15 years    Types: Cigarettes    Quit date: 04/03/1990  . Smokeless  tobacco: Not on file     Comment: patient states they just burned more than actually smoked  . Alcohol Use: Yes     Comment: occasionally    Review of Systems  Constitutional: Negative for fever, chills, diaphoresis, appetite change and fatigue.  HENT: Negative for sore throat, mouth sores and trouble swallowing.   Eyes: Negative for visual disturbance.  Respiratory: Negative for cough, chest tightness, shortness of breath and wheezing.   Cardiovascular: Negative for chest pain.  Gastrointestinal: Negative for nausea, vomiting, abdominal pain, diarrhea and abdominal distention.  Endocrine: Negative for polydipsia, polyphagia and polyuria.  Genitourinary: Negative for dysuria, frequency and hematuria.  Musculoskeletal: Negative for gait problem.  Skin: Negative for color change, pallor and rash.  Neurological: Positive  for speech difficulty. Negative for dizziness, syncope, light-headedness and headaches.  Hematological: Does not bruise/bleed easily.  Psychiatric/Behavioral: Positive for confusion. Negative for behavioral problems.    Allergies  Niacin  Home Medications   Current Outpatient Rx  Name  Route  Sig  Dispense  Refill  . amLODipine (NORVASC) 5 MG tablet   Oral   Take 1 tablet (5 mg total) by mouth daily.   30 tablet   6   . aspirin EC 81 MG tablet   Oral   Take 81 mg by mouth daily.         Marland Kitchen atorvastatin (LIPITOR) 80 MG tablet   Oral   Take 1 tablet (80 mg total) by mouth at bedtime.   30 tablet   6   . carvedilol (COREG) 12.5 MG tablet   Oral   Take 12.5 mg by mouth 2 (two) times daily with a meal.         . Cholecalciferol (VITAMIN D PO)   Oral   Take 1 tablet by mouth daily.         . clopidogrel (PLAVIX) 75 MG tablet   Oral   Take 1 tablet (75 mg total) by mouth daily with breakfast.   30 tablet   3   . Coenzyme Q10 (COQ-10) 100 MG CAPS   Oral   Take 1 capsule by mouth daily.         Marland Kitchen doxazosin (CARDURA) 8 MG tablet   Oral   Take 1 tablet (8 mg total) by mouth at bedtime.   90 tablet   3   . furosemide (LASIX) 40 MG tablet   Oral   Take 40 mg by mouth 3 (three) times daily.         . Insulin Glargine (LANTUS SOLOSTAR [place])   Subcutaneous   Inject 25 Units into the skin daily.          . isosorbide mononitrate (IMDUR) 60 MG 24 hr tablet   Oral   Take 1.5 tablets (90 mg total) by mouth daily.   45 tablet   3   . potassium chloride SA (K-DUR,KLOR-CON) 20 MEQ tablet   Oral   Take 20 mEq by mouth 2 (two) times daily.         . nitroGLYCERIN (NITROSTAT) 0.4 MG SL tablet   Sublingual   Place 0.4 mg under the tongue every 5 (five) minutes as needed for chest pain.          BP 162/77  Pulse 66  Temp(Src) 97.4 F (36.3 C) (Oral)  Resp 16  SpO2 96% Physical Exam  Constitutional: He is oriented to person, place, and time. He  appears well-developed and well-nourished. No distress.  HENT:  Head: Normocephalic.  Eyes: Conjunctivae are normal. Pupils are equal, round, and reactive to light. No scleral icterus.  Neck: Normal range of motion. Neck supple. No thyromegaly present.  Cardiovascular: Normal rate and regular rhythm.  Exam reveals no gallop and no friction rub.   No murmur heard. Pulmonary/Chest: Effort normal and breath sounds normal. No respiratory distress. He has no wheezes. He has no rales.  Abdominal: Soft. Bowel sounds are normal. He exhibits no distension. There is no tenderness. There is no rebound.  Musculoskeletal: Normal range of motion.  Neurological: He is alert and oriented to person, place, and time.  Cranial nerves II through XII are intact and symmetric. He has no nystagmus nor symptoms of vertigo with confrontation. He has normal activity extremities without pronator drift or strength deficit he has no sensory deficit cerebellar function is normal I observed him walk to the bed with a normal gait. Subjectively he is asymptomatic here optically his exam is normal  Skin: Skin is warm and dry. No rash noted.  Psychiatric: He has a normal mood and affect. His behavior is normal.    ED Course    EKG sinus rhythm right bundle branch block left anterior fascicular block unchanged for comparison  Procedures (including critical care time)  Labs Reviewed  APTT - Abnormal; Notable for the following:    aPTT 38 (*)    All other components within normal limits  CBC - Abnormal; Notable for the following:    RBC 3.91 (*)    Hemoglobin 11.3 (*)    HCT 32.7 (*)    All other components within normal limits  COMPREHENSIVE METABOLIC PANEL - Abnormal; Notable for the following:    Glucose, Bld 250 (*)    BUN 29 (*)    Creatinine, Ser 2.16 (*)    Total Protein 5.3 (*)    Albumin 2.4 (*)    GFR calc non Af Amer 28 (*)    GFR calc Af Amer 33 (*)    All other components within normal limits   GLUCOSE, CAPILLARY - Abnormal; Notable for the following:    Glucose-Capillary 233 (*)    All other components within normal limits  PROTIME-INR  DIFFERENTIAL  TROPONIN I  POCT I-STAT TROPONIN I   Ct Head (brain) Wo Contrast  08/18/2012   *RADIOLOGY REPORT*  Clinical Data:aphasia  CT HEAD WITHOUT CONTRAST  Technique:  Contiguous axial images were obtained from the base of the skull through the vertex without contrast. Study was obtained within 24 hours of patient arrival at the emergency department.  Comparison: Brain MRI December 17, 2008 and brain CT December 16, 2008  Findings:  There is mild diffuse atrophy which is stable.  There is no mass, hemorrhage, extra-axial fluid collection, or midline shift.  There is patchy small vessel disease in the centra semiovale bilaterally.  Bony calvarium appears intact.  The mastoid air cells are clear.  There is mild mucosal thickening in the left maxillary antrum.  IMPRESSION: Atrophy with patchy periventricular small vessel disease.  There is no apparent mass, hemorrhage, or acute appearing infarct.  There is mild mucosal thickening in the left maxillary antrum.   Original Report Authenticated By: Bretta Bang, M.D.   1. TIA (transient ischemic attack)     MDM  ED course clinical decision making. Patient had obvious TIA symptoms he is a normal exam here. His grandson revealed him during his episode of aphasia and it is quite compelling with word searching and substitution. Discussed with  hospitalist, Dr. Suanne Marker. Patient admitted and placed on a neurology regarding consultation. Diagnosis CVA/TIA  Claudean Kinds, MD 08/18/12 1914  Claudean Kinds, MD 08/18/12 [phone]

## 2012-08-18 NOTE — ED Notes (Signed)
Joshua Vincent states that pt has been depressed lately, talks about "ending it all"

## 2012-08-18 NOTE — ED Notes (Signed)
Pt states his grandson brought him here b/c he was "talking jibberish".  Pt states when he woke up at 8 this morning and felt dizzy.  He remains dizzy.  However, he when he was eating lunch at 1345 he thought he was speaking clearly but his grandson said he was speaking jibberish.  This lasted an hour along with r hand numbness per pt.  Pt now speaking clearly. No facial palsy and limbs equal in strength.  All s/s resolved at present.

## 2012-08-18 NOTE — ED Notes (Addendum)
States was eating at [employer] today at 1300 with family-- was talking, but family could not understand him. They said sounded like jibberish.  Pt is diabetic--took glucophage this morning, DID NOT EAT BREAKFAST THIS MORNING.

## 2012-08-18 NOTE — Consult Note (Signed)
Referring Physician: Dr. Onalee Hua    Chief Complaint: Transient aphasia.  HPI: Joshua Vincent is an 78 y.o. male history hypertension, hyperlipidemia, diabetes mellitus and congestive heart failure, as well as previous stroke, admitted following an episode of transient aphasia. Patient's speech output was nonsensical. He did not realize what he was saying make no sense. He can understand what was was saying to him. Onset was at noon today and symptoms lasted about one hour. Patient has been taking aspirin and Plavix daily. CT scan of his head showed no acute intracranial abnormality. NIH stroke score was 0.  LSN: Noon on 08/18/2012 tPA Given: No: Deficits rapidly resolved mRankin: Space 0  Past Medical History  Diagnosis Date  . HTN (hypertension)   . HLD (hyperlipidemia)   . Benign prostatic hypertrophy     Hx of urinary retention as well.  . Diabetes mellitus, type 2   . Osteoarthrosis, unspecified whether generalized or localized, shoulder region   . History of tobacco abuse     quit 1992  . CAD (coronary artery disease)     a. 4v CABG (1/11): LIMA LAD, SVG-D, SVG-OM, SVG-PLV. b. NSTEMI 08/2011 cath  revealed severe native CAD, patent SVG to OM & LIMA to LAD, occluded SVG to RCA and SVG to Diagonal, EF 40%, no culprit lesion for PCI, med therapy - f/u EF 55-60%. c. NSTEMI 06/2012 - stable anatomy by cath, med rx recommended.  . CVA (cerebral infarction)     peri-catheterization in 12/10.  Patient initially developed double vision and MRI showed right dorsal mid-brain infarct.  Carotid dopplers with no significant disease.  Double vision has resolved.    . Systolic and diastolic CHF, acute on chronic     a. EF 40% by cath 08/2011; EF 55-60% by echo 09/2011. b. EF drop to 15% by echo 06/2012.  Marland Kitchen Leg wound, left 08/2011  . Obesity   . Mitral regurgitation     a. Mild-mod by echo 07/01/12.  Marland Kitchen PVC's (premature ventricular contractions)     3 week  event monitor 8/11: no signifiant arrhythmias, occ  PVCs  . Chronic kidney disease     hx of kidney stones  . CKD (chronic kidney disease)     a. Baseline Cr 1.4. b. AKI 06/2012 with Cr at discharge around 2.2.  . Bladder cancer     s/p resection 7/11  . Iron deficiency anemia     Family History  Problem Relation Age of Onset  . Aneurysm Father     Father died at age 74 from an embolic cerebrovascular accident from a deep vein thrombosis in his lower extremity.  . Osteoarthritis Sister   . Heart disease Sister   . Aneurysm Brother      Medications: I have reviewed the patient's current medications.  ROS:   Physical Examination: Blood pressure 157/68, pulse 71, temperature 97.8 F (36.6 C), temperature source Oral, resp. rate 21, SpO2 95.00%.  Neurologic Examination: Mental Status: Alert, oriented, thought content appropriate.  Speech fluent without evidence of aphasia. Able to follow commands without difficulty. Cranial Nerves: II-Visual fields were normal. III/IV/VI-Pupils were equal and reacted. Extraocular movements were full and conjugate.    V/VII-no facial numbness and no facial weakness. VIII-normal. X-normal speech and symmetrical palatal movement. Motor: 5/5 bilaterally with normal tone and bulk Sensory: Normal throughout. Deep Tendon Reflexes: 2+ and symmetric. Plantars: Mute bilaterally Cerebellar: Normal finger-to-nose testing. Carotid auscultation: Normal  Ct Head (brain) Wo Contrast  08/18/2012   *RADIOLOGY REPORT*  Clinical Data:aphasia  CT HEAD WITHOUT CONTRAST  Technique:  Contiguous axial images were obtained from the base of the skull through the vertex without contrast. Study was obtained within 24 hours of patient arrival at the emergency department.  Comparison: Brain MRI December 17, 2008 and brain CT December 16, 2008  Findings:  There is mild diffuse atrophy which is stable.  There is no mass, hemorrhage, extra-axial fluid collection, or midline shift.  There is patchy small vessel disease in the  centra semiovale bilaterally.  Bony calvarium appears intact.  The mastoid air cells are clear.  There is mild mucosal thickening in the left maxillary antrum.  IMPRESSION: Atrophy with patchy periventricular small vessel disease.  There is no apparent mass, hemorrhage, or acute appearing infarct.  There is mild mucosal thickening in the left maxillary antrum.   Original Report Authenticated By: Bretta Bang, M.D.    Assessment: 78 y.o. male with multiple risk factors for stroke and history of previous stroke, presenting with probable TIA involving left subcortical MCA territory. Small infarct cannot be ruled out at this point.  Stroke Risk Factors - diabetes mellitus, family history, hyperlipidemia and hypertension  Plan: 1. HgbA1c, fasting lipid panel 2. MRI, MRA  of the brain without contrast 3. PT consult, OT consult, Speech consult 4. Echocardiogram 5. Carotid dopplers 6. Prophylactic therapy-Antiplatelet med: Aspirin 81 mg per day and Plavix 75 mg per day 7. Risk factor modification 8. Telemetry monitoring   C.R. Roseanne Reno, MD [facility] [phone]  08/18/2012, 9:28 PM

## 2012-08-19 ENCOUNTER — Inpatient Hospital Stay (HOSPITAL_COMMUNITY): Payer: Medicare Other

## 2012-08-19 DIAGNOSIS — I517 Cardiomegaly: Secondary | ICD-10-CM

## 2012-08-19 LAB — GLUCOSE, CAPILLARY
Glucose-Capillary: 128 mg/dL — ABNORMAL HIGH (ref 70–99)
Glucose-Capillary: 180 mg/dL — ABNORMAL HIGH (ref 70–99)
Glucose-Capillary: 182 mg/dL — ABNORMAL HIGH (ref 70–99)

## 2012-08-19 LAB — LIPID PANEL
Cholesterol: 243 mg/dL — ABNORMAL HIGH (ref 0–200)
HDL: 32 mg/dL — ABNORMAL LOW (ref >39)
Total CHOL/HDL Ratio: 7.6 RATIO
Triglycerides: 139 mg/dL (ref <150)
VLDL: 28 mg/dL (ref 0–40)

## 2012-08-19 LAB — HEMOGLOBIN A1C: Mean Plasma Glucose: 154 mg/dL — ABNORMAL HIGH (ref <117)

## 2012-08-19 MED ORDER — INSULIN GLARGINE 100 UNIT/ML ~~LOC~~ SOLN
20.0000 [IU] | Freq: Every day | SUBCUTANEOUS | Status: DC
  Administered 2012-08-19: 20 [IU] via SUBCUTANEOUS
  Filled 2012-08-19 (×2): qty 0.2

## 2012-08-19 MED ORDER — INSULIN ASPART 100 UNIT/ML ~~LOC~~ SOLN
0.0000 [IU] | Freq: Three times a day (TID) | SUBCUTANEOUS | Status: DC
  Administered 2012-08-19 (×2): 1 [IU] via SUBCUTANEOUS
  Administered 2012-08-19: 2 [IU] via SUBCUTANEOUS
  Administered 2012-08-20: 1 [IU] via SUBCUTANEOUS

## 2012-08-19 MED ORDER — FUROSEMIDE 40 MG PO TABS
40.0000 mg | ORAL_TABLET | Freq: Two times a day (BID) | ORAL | Status: DC
  Administered 2012-08-19 – 2012-08-20 (×3): 40 mg via ORAL
  Filled 2012-08-19 (×5): qty 1

## 2012-08-19 NOTE — Evaluation (Signed)
Speech Language Pathology Evaluation Patient Details Name: EASTON FETTY MRN: [id number] DOB: 1934-10-29 Today's Date: 08/19/2012 Time: 6213-0865 SLP Time Calculation (min): 26 min  Problem List:  Patient Active Problem List   Diagnosis Date Noted  . Aphasia 08/18/2012  . TIA on medication 08/18/2012  . Non-ST elevation myocardial infarction (NSTEMI), initial care episode 06/30/2012  . Acute on chronic diastolic CHF (congestive heart failure), NYHA class 4 06/30/2012  . Acute renal failure 06/30/2012  . Hyponatremia 06/30/2012  . Community acquired pneumonia 06/30/2012  . Hypokalemia 06/30/2012  . OSA (obstructive sleep apnea) 12/14/2011  . Hypertensive crisis 09/22/2011  . Acute on chronic combined systolic and diastolic heart failure 09/22/2011  . Anemia 08/21/2011  . SYNCOPE 08/15/2009  . FINGER PAIN 05/17/2009  . Coronary atherosclerosis of native coronary artery 02/25/2009  . DIASTOLIC HEART FAILURE, CHRONIC 01/06/2009  . DM 01/05/2009  . DIABETIC PERIPHERAL NEUROPATHY 01/05/2009  . DYSLIPIDEMIA 01/05/2009  . HYPERTENSION 01/05/2009  . MYOCARDIAL INFARCTION, ACUTE, SUBENDOCARDIAL 01/05/2009  . CEREBROVASCULAR ACCIDENT 01/05/2009  . PULMONARY EDEMA, ACUTE 01/05/2009  . OSTEOARTHRITIS, SHOULDER, RIGHT 01/05/2009  . BENIGN PROSTATIC HYPERTROPHY, HX OF 01/05/2009  . TOBACCO USE, QUIT 01/05/2009   Past Medical History:  Past Medical History  Diagnosis Date  . HTN (hypertension)   . HLD (hyperlipidemia)   . Benign prostatic hypertrophy     Hx of urinary retention as well.  . Diabetes mellitus, type 2   . Osteoarthrosis, unspecified whether generalized or localized, shoulder region   . History of tobacco abuse     quit 1992  . CAD (coronary artery disease)     a. 4v CABG (1/11): LIMA LAD, SVG-D, SVG-OM, SVG-PLV. b. NSTEMI 08/2011 cath  revealed severe native CAD, patent SVG to OM & LIMA to LAD, occluded SVG to RCA and SVG to Diagonal, EF 40%, no culprit lesion for PCI,  med therapy - f/u EF 55-60%. c. NSTEMI 06/2012 - stable anatomy by cath, med rx recommended.  . CVA (cerebral infarction)     peri-catheterization in 12/10.  Patient initially developed double vision and MRI showed right dorsal mid-brain infarct.  Carotid dopplers with no significant disease.  Double vision has resolved.    . Systolic and diastolic CHF, acute on chronic     a. EF 40% by cath 08/2011; EF 55-60% by echo 09/2011. b. EF drop to 15% by echo 06/2012.  Marland Kitchen Leg wound, left 08/2011  . Obesity   . Mitral regurgitation     a. Mild-mod by echo 07/01/12.  Marland Kitchen PVC's (premature ventricular contractions)     3 week  event monitor 8/11: no signifiant arrhythmias, occ PVCs  . Chronic kidney disease     hx of kidney stones  . CKD (chronic kidney disease)     a. Baseline Cr 1.4. b. AKI 06/2012 with Cr at discharge around 2.2.  . Bladder cancer     s/p resection 7/11  . Iron deficiency anemia    Past Surgical History:  Past Surgical History  Procedure Laterality Date  . Appendectomy    . Transurethral resection of prostate    . Coronary artery bypass graft  2011  . Retinal detachment surgery    . Eye surgery  March 20 2012   HPI:  78 yo male eating with a friend earlier when he suddenly could not speak, and got blurred vision.  Lasted for about an hour than resolved in ED.  No recent fevers.  No n/t arms or legs.  Now no  problems, no blurred vision, speaking normally now.  No rashes.  No n/v/d. No problem swallowing.  On a baby asa a day already and plavix.  No cp.  No sob.  No le edema or swelling.   Assessment / Plan / Recommendation Clinical Impression  Speech, language and cognition are [facility] and consisent with baseline. No ST f/u recommended at this time.    SLP Assessment  Patient does not need any further Speech Lanaguage Pathology Services    Follow Up Recommendations  None    Frequency and Duration        Pertinent Vitals/Pain No pain reported at this time   SLP Goals     SLP  Evaluation Prior Functioning  Cognitive/Linguistic Baseline: Within functional limits Type of Home: House  Lives With: Spouse Available Help at Discharge: Family;Home health Vocation: Retired   IT consultant  Overall Cognitive Status: Within Functional Limits for tasks assessed Arousal/Alertness: Awake/alert Orientation Level: Oriented X4 Attention: Alternating Focused Attention: Appears intact Alternating Attention: Appears intact Memory: Appears intact Awareness: Appears intact Problem Solving: Appears intact Executive Function: Reasoning;Decision Making Reasoning: Appears intact Decision Making: Appears intact    Comprehension  Auditory Comprehension Overall Auditory Comprehension: Appears within functional limits for tasks assessed Conversation: Complex Visual Recognition/Discrimination Discrimination: Not tested Reading Comprehension Reading Status: Within funtional limits    Expression Expression Primary Mode of Expression: Verbal Verbal Expression Overall Verbal Expression: Appears within functional limits for tasks assessed Level of Generative/Spontaneous Verbalization: Conversation Naming: No impairment Pragmatics: No impairment   Oral / Motor Oral Motor/Sensory Function Overall Oral Motor/Sensory Function: Appears within functional limits for tasks assessed Motor Speech Overall Motor Speech: Appears within functional limits for tasks assessed Articulation: Within functional limitis Intelligibility: Intelligible Motor Planning: Witnin functional limits Motor Speech Errors: Not applicable   GO Functional Assessment Tool Used: clinical judgement, non standardized assessment   Annalynne Ibanez, Radene Journey 08/19/2012, 10:42 AM

## 2012-08-19 NOTE — Progress Notes (Signed)
VASCULAR LAB PRELIMINARY  PRELIMINARY  PRELIMINARY  PRELIMINARY  Carotid duplex  completed.    Preliminary report:  Bilateral:  1-39% ICA stenosis.  Vertebral artery flow is antegrade.  Left vertebral artery waveform is diminished and spiked suggesting distal obstruction.    Gunda Maqueda, RVT 08/19/2012, 1:24 PM

## 2012-08-19 NOTE — Progress Notes (Signed)
Stroke Team Progress Note  HISTORY Joshua Vincent is an 78 y.o. male history hypertension, hyperlipidemia, diabetes mellitus and congestive heart failure, as well as previous stroke, admitted following an episode of transient aphasia. Patient's speech output was nonsensical. He did not realize what he was saying make no sense. He can understand what was was saying to him. Onset was at noon today and symptoms lasted about one hour. Patient has been taking aspirin and Plavix daily. CT scan of his head showed no acute intracranial abnormality. NIH stroke score was 0.   Patient was not a TPA candidate secondary to resolution of symptoms. He was admitted for further evaluation and treatment.  SUBJECTIVE  Says his family member taped him talking "gibberish". He watched it back and wondered "What am I saying?" Resolve   OBJECTIVE Most recent Vital Signs: Filed Vitals:   08/19/12 0100 08/19/12 0300 08/19/12 0500 08/19/12 0700  BP: 157/81 158/75 132/66 145/63  Pulse: 78  77 77  Temp: 98.6 F (37 C)  98.3 F (36.8 C) 98 F (36.7 C)  TempSrc: Oral  Oral Oral  Resp: 20  20 20   Height:      Weight:      SpO2: 98% 96% 96% 97%   CBG (last 3)   Recent Labs  08/18/12 2338 08/19/12 0651 08/19/12 0755  GLUCAP 202* 126* 128*    IV Fluid Intake:     MEDICATIONS  . amLODipine  5 mg Oral Daily  . aspirin EC  81 mg Oral Daily  . atorvastatin  80 mg Oral QHS  . carvedilol  12.5 mg Oral BID WC  . clopidogrel  75 mg Oral Q breakfast  . doxazosin  8 mg Oral QHS  . furosemide  40 mg Oral BID  . insulin aspart  0-9 Units Subcutaneous TID WC  . insulin glargine  20 Units Subcutaneous Daily  . isosorbide mononitrate  90 mg Oral Daily  . potassium chloride SA  20 mEq Oral BID  . sodium chloride  3 mL Intravenous Q12H   PRN:  sodium chloride, sodium chloride  Diet:    carb modified liquids Activity:  Bedrest DVT Prophylaxis:  SCD  CLINICALLY SIGNIFICANT STUDIES Basic Metabolic Panel:   Recent Labs Lab 08/15/12 1213 08/18/12 1604  NA 139 135  K 5.0 4.2  CL 105 104  CO2 25 22  GLUCOSE 136* 250*  BUN 25* 29*  CREATININE 2.2* 2.16*  CALCIUM 8.9 8.6   Liver Function Tests:  Recent Labs Lab 08/18/12 1604  AST 12  ALT 11  ALKPHOS 76  BILITOT 0.4  PROT 5.3*  ALBUMIN 2.4*   CBC:  Recent Labs Lab 08/18/12 1604  WBC 5.8  NEUTROABS 4.1  HGB 11.3*  HCT 32.7*  MCV 83.6  PLT 154   Coagulation:  Recent Labs Lab 08/18/12 1604  LABPROT 13.7  INR 1.07   Cardiac Enzymes:  Recent Labs Lab 08/18/12 1604  TROPONINI <0.30   Urinalysis: No results found for this basename: COLORURINE, APPERANCEUR, LABSPEC, PHURINE, GLUCOSEU, HGBUR, BILIRUBINUR, KETONESUR, PROTEINUR, UROBILINOGEN, NITRITE, LEUKOCYTESUR,  in the last 168 hours Lipid Panel    Component Value Date/Time   CHOL 243* 08/19/2012 0505   TRIG 139 08/19/2012 0505   HDL 32* 08/19/2012 0505   CHOLHDL 7.6 08/19/2012 0505   VLDL 28 08/19/2012 0505   LDLCALC 183* 08/19/2012 0505   HgbA1C  Lab Results  Component Value Date   HGBA1C 6.9* 06/30/2012    Urine Drug Screen:  No results found for this basename: labopia, cocainscrnur, labbenz, amphetmu, thcu, labbarb    Alcohol Level: No results found for this basename: ETH,  in the last 168 hours  Ct Head (brain) Wo Contrast 08/18/2012  Atrophy with patchy periventricular small vessel disease.  There is no apparent mass, hemorrhage, or acute appearing infarct.  There is mild mucosal thickening in the left maxillary antrum.     MRI of the brain    MRA of the brain    2D Echocardiogram    Carotid Doppler    CXR    EKG  RBBB.   Therapy Recommendations no follow uup  Physical Exam   Pleasant elderly Caucasian male not in distress.Awake alert. Afebrile. Head is nontraumatic. Neck is supple without bruit. Hearing is normal. Cardiac exam no murmur or gallop. Lungs are clear to auscultation. Distal pulses are well felt. Neurological Exam ;  Awake  Alert  oriented x 3. Normal speech and language.eye movements full without nystagmus.fundi were not visualized. Vision acuity and fields appear normal. Hearing is normal. Palatal movements are normal. Face symmetric. Tongue midline. Normal strength, tone, reflexes and coordination. Normal sensation. Gait deferred. ASSESSMENT [name] is a 78 y.o. male presenting with transient aphasia. CT imaging confirms a small vessel disease. MRI to be done today. Infarct felt to be thrombotic, small vessel disease.  On aspirin 81 mg orally every day and clopidogrel 75 mg orally every day prior to admission. Now on aspirin 81 mg orally every day and clopidogrel 75 mg orally every day for secondary stroke prevention. Patient with resultant transient aphasia. Work up underway.   Hyperlipidemia, LDL 183, goal < 70 in diabetic patients--on lipitor  Hypertension  Diabetes mellitus, hgb a1c 6.9  Long term medication use  History of tobacco use, cessation  Previous CVA, mid brain, diplopia-resolved  CKD with Cr in June 2014 at 2.2  Bladder Cancer resection 2011  Hospital day # 1  TREATMENT/PLAN  Continue aspirin 81 mg orally every day and clopidogrel 75 mg orally every day for secondary stroke prevention.  Risk factor modification  MR studies pending  No further ST/PT felt needed.  Gwendolyn Lima. Manson Passey, [facility], MBA, MHA [facility] Pager: [phone] 08/19/2012 2:02 PM  I have personally obtained a history, examined the patient, evaluated imaging results, and formulated the assessment and plan of care. I agree with the above. Delia Heady, MD

## 2012-08-19 NOTE — Progress Notes (Signed)
[facility] PROGRESS NOTE  Joshua Vincent:[id number] DOB: [birth year] DOA: 08/18/2012 PCP: Benita Stabile, MD  Assessment/Plan: Principal Problem:   TIA on medication Active Problems:   DM   HYPERTENSION   Coronary atherosclerosis of native coronary artery   DIASTOLIC HEART FAILURE, CHRONIC   Aphasia     probable TIA involving left subcortical MCA territory Pending workup HgbA1c, fasting lipid panel triglycerides 139, HDL 32, LDL 183 2. MRI, MRA of the brain without contrast  3. PT consult, OT consult, Speech consult  4. Echocardiogram  5. Carotid dopplers  6. Prophylactic therapy-Antiplatelet med: Aspirin 81 mg per day and Plavix 75 mg per day , continue Lipitor 7. Risk factor modification  8. Telemetry monitoring    Diabetes mellitus type 2 Hemoglobin A1c pending Continue SSI Continue Lantus  CAD continue Coreg and Imdur  Hypertension continue Norvasc, Coreg, doxazosin,  Chronic diastolic heart failure continue Lasix  Chronic kidney disease   Code Status: full Family Communication: family updated about patient's clinical progress Disposition Plan:  Anticipate discharge tomorrow after workup completed   Brief narrative: Joshua Vincent is an 78 y.o. male history hypertension, hyperlipidemia, diabetes mellitus and congestive heart failure, as well as previous stroke, admitted following an episode of transient aphasia. Patient's speech output was nonsensical. He did not realize what he was saying make no sense. He can understand what was was saying to him. Onset was at noon today and symptoms lasted about one hour. Patient has been taking aspirin and Plavix daily. CT scan of his head showed no acute intracranial abnormality. NIH stroke score was 0.      Consultants:  Neurology  Procedures:  None Antibiotics:  None  HPI/Subjective: Back to baseline   Objective: Filed Vitals:   08/19/12 0100 08/19/12 0300 08/19/12 0500 08/19/12 0700   BP: 157/81 158/75 132/66 145/63  Pulse: 78  77 77  Temp: 98.6 F (37 C)  98.3 F (36.8 C) 98 F (36.7 C)  TempSrc: Oral  Oral Oral  Resp: 20  20 20   Height:      Weight:      SpO2: 98% 96% 96% 97%   No intake or output data in the 24 hours ending 08/19/12 0728  Exam:   .  Neck: Neck supple. No tracheal deviation present.  Cardiovascular: Normal rate, regular rhythm, normal heart sounds and intact distal pulses.  Pulmonary/Chest: Effort normal and breath sounds normal. No respiratory distress.  Abdominal: Soft. Normal appearance and bowel sounds are normal. Cranial nerves II through XII are intact and symmetric. He has no nystagmus nor symptoms of vertigo with confrontation. He has normal activity extremities without pronator drift or strength deficit he has no sensory deficit cerebellar function is normal I observed him walk to the bed with a normal gait. Subjectively he is asymptomatic here optically his exam is normal     Data Reviewed: Basic Metabolic Panel:  Recent Labs Lab 08/15/12 1213 08/18/12 1604  NA 139 135  K 5.0 4.2  CL 105 104  CO2 25 22  GLUCOSE 136* 250*  BUN 25* 29*  CREATININE 2.2* 2.16*  CALCIUM 8.9 8.6    Liver Function Tests:  Recent Labs Lab 08/18/12 1604  AST 12  ALT 11  ALKPHOS 76  BILITOT 0.4  PROT 5.3*  ALBUMIN 2.4*   No results found for this basename: LIPASE, AMYLASE,  in the last 168 hours No results found for this basename: AMMONIA,  in the last 168 hours  CBC:  Recent Labs Lab 08/18/12 1604  WBC 5.8  NEUTROABS 4.1  HGB 11.3*  HCT 32.7*  MCV 83.6  PLT 154    Cardiac Enzymes:  Recent Labs Lab 08/18/12 1604  TROPONINI <0.30   BNP (last 3 results)  Recent Labs  12/19/11 1227 06/30/12 1320 08/15/12 1213  PROBNP 247.0* 52044.0* 646.0*     CBG:  Recent Labs Lab 08/18/12 1537 08/18/12 2338 08/19/12 0651  GLUCAP 233* 202* 126*    No results found for this or any previous visit (from the past 240  hour(s)).   Studies: Ct Head (brain) Wo Contrast  08/18/2012   *RADIOLOGY REPORT*  Clinical Data:aphasia  CT HEAD WITHOUT CONTRAST  Technique:  Contiguous axial images were obtained from the base of the skull through the vertex without contrast. Study was obtained within 24 hours of patient arrival at the emergency department.  Comparison: Brain MRI December 17, 2008 and brain CT December 16, 2008  Findings:  There is mild diffuse atrophy which is stable.  There is no mass, hemorrhage, extra-axial fluid collection, or midline shift.  There is patchy small vessel disease in the centra semiovale bilaterally.  Bony calvarium appears intact.  The mastoid air cells are clear.  There is mild mucosal thickening in the left maxillary antrum.  IMPRESSION: Atrophy with patchy periventricular small vessel disease.  There is no apparent mass, hemorrhage, or acute appearing infarct.  There is mild mucosal thickening in the left maxillary antrum.   Original Report Authenticated By: Bretta Bang, M.D.    Scheduled Meds: . amLODipine  5 mg Oral Daily  . aspirin EC  81 mg Oral Daily  . atorvastatin  80 mg Oral QHS  . carvedilol  12.5 mg Oral BID WC  . clopidogrel  75 mg Oral Q breakfast  . doxazosin  8 mg Oral QHS  . furosemide  40 mg Oral TID  . isosorbide mononitrate  90 mg Oral Daily  . potassium chloride SA  20 mEq Oral BID  . sodium chloride  3 mL Intravenous Q12H   Continuous Infusions:   Principal Problem:   TIA on medication Active Problems:   DM   HYPERTENSION   Coronary atherosclerosis of native coronary artery   DIASTOLIC HEART FAILURE, CHRONIC   Aphasia    Time spent: 40 minutes   [facility]  [facility] Pager [phone]. If 8PM-8AM, please contact night-coverage at [email or web address], password [facility] 08/19/2012, 7:28 AM  LOS: 1 day

## 2012-08-19 NOTE — Evaluation (Signed)
Physical Therapy Evaluation Patient Details Name: Joshua Vincent MRN: [id number] DOB: 07-May-1934 Today's Date: 08/19/2012 Time: 1478-2956 PT Time Calculation (min): 33 min  PT Assessment / Plan / Recommendation History of Present Illness  Sudden onset of expressive aphasia, witnessed by pt's family.  Now resolved.  Clinical Impression  Patient evaluated by Physical Therapy with no further acute PT needs identified. All education has been completed and the patient has no further questions. Pt at his baseline functioning and in independent.   See below for any follow-up Physial Therapy or equipment needs. PT is signing off. Thank you for this referral.     PT Assessment  Patent does not need any further PT services    Follow Up Recommendations  No PT follow up    Does the patient have the potential to tolerate intense rehabilitation      Barriers to Discharge        Equipment Recommendations  None recommended by PT    Recommendations for Other Services     Frequency      Precautions / Restrictions Precautions Precautions: Fall Restrictions Weight Bearing Restrictions: Yes   Pertinent Vitals/Pain Back pain increasing with activity.      Mobility  Bed Mobility Bed Mobility: Supine to Sit;Sitting - Scoot to Edge of Bed Supine to Sit: 7: Independent Sitting - Scoot to [place] of Bed: 7: Independent Transfers Transfers: Sit to Stand;Stand to Sit Sit to Stand: 7: Independent Stand to Sit: 7: Independent Ambulation/Gait Ambulation/Gait Assistance: 7: Independent Ambulation Distance (Feet): 250 Feet Assistive device: None Ambulation/Gait Assistance Details: steady, but stiff and stooped with back pain, so doesn't move as freely as he would like. Gait Pattern: Within Functional Limits Gait velocity: slower Stairs: Yes Stairs Assistance: 6: Modified independent (Device/Increase time) Stair Management Technique: One rail Right;Alternating pattern;Forwards Number of Stairs:  10 Wheelchair Mobility Wheelchair Mobility: No Modified Rankin (Stroke Patients Only) Pre-Morbid Rankin Score: No symptoms Modified Rankin: No symptoms    Exercises     PT Diagnosis:    PT Problem List:   PT Treatment Interventions:       PT Goals(Current goals can be found in the care plan section)    Visit Information  Last PT Received On: 08/19/12 Assistance Needed: +1 History of Present Illness: Sudden onset of expressive aphasia, witnessed by pt's family.  Now resolved.       Prior Functioning  Home Living Family/patient expects to be discharged to:: Private residence Living Arrangements: Spouse/significant other;Other relatives (grandson, his wife and a great grand daughter) Available Help at Discharge: Family;Home health Type of Home: House Home Access: Stairs to enter Secretary/administrator of Steps: 4 Entrance Stairs-Rails: Right;Left Home Layout: Two level;Bed/bath upstairs Alternate Level Stairs-Number of Steps: 12 Alternate Level Stairs-Rails: Right;Left Home Equipment: Walker - 2 wheels;Cane - single point;Bedside commode;Wheelchair - manual  Lives With: Spouse (and his grandson and family live with him) Prior Function Level of Independence: Independent Comments: pt has moderately severe back pain that hinders his ability to exercise and therefore is is moderately deconditioned with DOE Communication Communication: No difficulties    Cognition  Cognition Arousal/Alertness: Awake/alert Behavior During Therapy: WFL for tasks assessed/performed Overall Cognitive Status: Within Functional Limits for tasks assessed    Extremity/Trunk Assessment Upper Extremity Assessment Upper Extremity Assessment: Overall WFL for tasks assessed Lower Extremity Assessment Lower Extremity Assessment: Overall WFL for tasks assessed   Balance High Level Balance High Level Balance Activites: Direction changes;Turns;Sudden stops;Head turns High Level Balance Comments: No  LOB,  but stiff with back pain and unable to do alot of higher level activity.  End of Session PT - End of Session Activity Tolerance: Patient tolerated treatment well Patient left: in chair;with call bell/phone within reach Nurse Communication: Mobility status  GP     Juan Olthoff, Eliseo Gum 08/19/2012, 11:14 AM 08/19/2012  Hyampom Bing, PT [phone]  (pager)

## 2012-08-20 ENCOUNTER — Telehealth: Payer: Self-pay | Admitting: Cardiology

## 2012-08-20 DIAGNOSIS — I2510 Atherosclerotic heart disease of native coronary artery without angina pectoris: Secondary | ICD-10-CM

## 2012-08-20 DIAGNOSIS — E1149 Type 2 diabetes mellitus with other diabetic neurological complication: Secondary | ICD-10-CM

## 2012-08-20 LAB — BASIC METABOLIC PANEL
Calcium: 9.2 mg/dL (ref 8.4–10.5)
GFR calc Af Amer: 29 mL/min — ABNORMAL LOW (ref >90)
GFR calc non Af Amer: 25 mL/min — ABNORMAL LOW (ref >90)
Sodium: 141 mEq/L (ref 135–145)

## 2012-08-20 LAB — CBC
MCH: 28.8 pg (ref 26.0–34.0)
MCHC: 34.6 g/dL (ref 30.0–36.0)
Platelets: 164 10*3/uL (ref 150–400)

## 2012-08-20 NOTE — Telephone Encounter (Signed)
Reviewed echo from the hospital.  EF is actually considerably improved.  Please review his medications with him and make sure that he is on the meds that he is supposed to be taking (reference last cardiology appointment).

## 2012-08-20 NOTE — Telephone Encounter (Signed)
New problem    Pt wanted to inform dr Shirlee Latch that he just got out of the hospital due to a mini stroke

## 2012-08-20 NOTE — Discharge Summary (Signed)
Physician Discharge Summary  Patient ID: Joshua Vincent MRN: [id number] DOB/AGE: 06-11-1934 78 y.o.  Admit date: 08/18/2012 Discharge date: 08/20/2012  Primary Care Physician:  Benita Stabile, MD  Discharge Diagnoses:    . HYPERTENSION . expressive Aphasia- resolved  . TIA on medication . Coronary atherosclerosis of native coronary artery . DM . DIASTOLIC HEART FAILURE, CHRONIC  Consults: Neurology   Recommendations for Outpatient Follow-up:  - Hemoglobin A1c 7.0. Please follow blood sugar readings for any further adjustment in the insulin regimen.  Allergies:   Allergies  Allergen Reactions  . Niacin Itching     Discharge Medications:   Medication List         amLODipine 5 MG tablet  Commonly known as:  NORVASC  Take 1 tablet (5 mg total) by mouth daily.     aspirin EC 81 MG tablet  Take 81 mg by mouth daily.     atorvastatin 80 MG tablet  Commonly known as:  LIPITOR  Take 1 tablet (80 mg total) by mouth at bedtime.     carvedilol 12.5 MG tablet  Commonly known as:  COREG  Take 12.5 mg by mouth 2 (two) times daily with a meal.     clopidogrel 75 MG tablet  Commonly known as:  PLAVIX  Take 1 tablet (75 mg total) by mouth daily with breakfast.     CoQ-10 100 MG Caps  Take 1 capsule by mouth daily.     doxazosin 8 MG tablet  Commonly known as:  CARDURA  Take 1 tablet (8 mg total) by mouth at bedtime.     furosemide 40 MG tablet  Commonly known as:  LASIX  Take 40 mg by mouth 3 (three) times daily.     isosorbide mononitrate 60 MG 24 hr tablet  Commonly known as:  IMDUR  Take 1.5 tablets (90 mg total) by mouth daily.     LANTUS SOLOSTAR [employer]  Inject 25 Units into the skin daily.     nitroGLYCERIN 0.4 MG SL tablet  Commonly known as:  NITROSTAT  Place 0.4 mg under the tongue every 5 (five) minutes as needed for chest pain.     potassium chloride SA 20 MEQ tablet  Commonly known as:  K-DUR,KLOR-CON  Take 20 mEq by mouth 2 (two) times daily.      VITAMIN D PO  Take 1 tablet by mouth daily.         Brief H and P: For complete details please refer to admission H and P, but in brief the patient is a 78 year old male with history of hypertension, hyperlipidemia, diabetes, history of systolic CHF, was eating with his grandson at [employer] [facility] [employer] restaurant, when he suddenly could not speak and blurred vision. It lasted about an hour, resolved in ED. Patient was on baby aspirin and Plavix prior to admission. He was admitted for TIA workup.  Hospital Course:  Transient aphasia: Likely secondary to TIA. Symptoms resolved.  - MRI of the brain did not show any acute infarct, showed moderate global atrophy without hydrocephalus, moderate small vessel disease changes. C3-C4 bulge/effusion with mild cord flattening. MRA showed intracranial atherosclerotic type changes once again most notable involving the left vertebral artery. 2-D echo showed EF of 50-55%, grade 1 diastolic dysfunction, mildly dilated left atrium and right atrium, no cardiac source of emboli - Carotid Doppler showed bilateral 1-39% ICA stenosis. - Continue aspirin, Plavix, atorvastatin.fasting lipid panel triglycerides 139, HDL 32, LDL 183   - He was not consider TPA  candidate scan due to resolution of the symptoms. - PT evaluation was done and did not need any PT followup, patient's speech has resolved to baseline as well.  Diabetes mellitus: Hemoglobin A1c 7.0, patient is insulin-dependent. Continue Lantus, followup with PCP.   Day of Discharge BP 148/62  Pulse 75  Temp(Src) 98.2 F (36.8 C) (Oral)  Resp 20  Ht 5\' 11"  (1.803 m)  Wt 83.1 kg (183 lb 3.2 oz)  BMI 25.56 kg/m2  SpO2 98%  Physical Exam: General: Alert and awake oriented x3 not in any acute distress. HEENT: anicteric sclera, pupils reactive to light and accommodation CVS: S1-S2 clear no murmur rubs or gallops Chest: clear to auscultation bilaterally, no wheezing rales or rhonchi Abdomen: soft  nontender, nondistended, normal bowel sounds, no organomegaly Extremities: no cyanosis, clubbing or edema noted bilaterally Neuro: Cranial nerves II-XII intact, no focal neurological deficits   The results of significant diagnostics from this hospitalization (including imaging, microbiology, ancillary and laboratory) are listed below for reference.    LAB RESULTS: Basic Metabolic Panel:  Recent Labs Lab 08/18/12 1604 08/20/12 0640  NA 135 141  K 4.2 3.8  CL 104 108  CO2 22 24  GLUCOSE 250* 140*  BUN 29* 28*  CREATININE 2.16* 2.36*  CALCIUM 8.6 9.2   Liver Function Tests:  Recent Labs Lab 08/18/12 1604  AST 12  ALT 11  ALKPHOS 76  BILITOT 0.4  PROT 5.3*  ALBUMIN 2.4*   No results found for this basename: LIPASE, AMYLASE,  in the last 168 hours No results found for this basename: AMMONIA,  in the last 168 hours CBC:  Recent Labs Lab 08/18/12 1604 08/20/12 0640  WBC 5.8 4.9  NEUTROABS 4.1  --   HGB 11.3* 11.5*  HCT 32.7* 33.2*  MCV 83.6 83.0  PLT 154 164   Cardiac Enzymes:  Recent Labs Lab 08/18/12 1604  TROPONINI <0.30   BNP: No components found with this basename: POCBNP,  CBG:  Recent Labs Lab 08/19/12 2127 08/20/12 0640  GLUCAP 182* 131*    Significant Diagnostic Studies:  Ct Head (brain) Wo Contrast  08/18/2012   *RADIOLOGY REPORT*  Clinical Data:aphasia  CT HEAD WITHOUT CONTRAST  Technique:  Contiguous axial images were obtained from the base of the skull through the vertex without contrast. Study was obtained within 24 hours of patient arrival at the emergency department.  Comparison: Brain MRI December 17, 2008 and brain CT December 16, 2008  Findings:  There is mild diffuse atrophy which is stable.  There is no mass, hemorrhage, extra-axial fluid collection, or midline shift.  There is patchy small vessel disease in the centra semiovale bilaterally.  Bony calvarium appears intact.  The mastoid air cells are clear.  There is mild mucosal  thickening in the left maxillary antrum.  IMPRESSION: Atrophy with patchy periventricular small vessel disease.  There is no apparent mass, hemorrhage, or acute appearing infarct.  There is mild mucosal thickening in the left maxillary antrum.   Original Report Authenticated By: Bretta Bang, M.D.   Mr Brain Wo Contrast  08/19/2012   *RADIOLOGY REPORT*  Clinical Data:  Episode of difficulty speaking and blurred vision which lasted for 1 hour.  Diabetic hypertensive smoker.  MRI BRAIN WITHOUT CONTRAST MRA HEAD WITHOUT CONTRAST  Technique: Multiplanar, multiecho pulse sequences of the brain and surrounding structures were obtained according to standard protocol without intravenous contrast.  Angiographic images of the head were obtained using MRA technique without contrast.  Comparison: 08/18/2012 CT.  12/17/2008 MR.  MRI HEAD  Findings:  No acute infarct.  No intracranial hemorrhage.  Moderate small vessel disease type changes.  Moderate global atrophy without hydrocephalus.  No intracranial mass lesion detected on this unenhanced exam.  Mild paranasal sinus mucosal thickening.  Partial opacification mastoid air cells greater on the left.  No definitive mass is seen in the posterior-superior nasopharynx causing left sided eustachian tube dysfunction.  There is minimal asymmetry of the posterior nasal pharyngeal region which appears similar to the prior exam. To exclude subtle mucosal abnormality, direct visualization would be necessary.  C3-4 bulge/protrusion with mild cord flattening.  IMPRESSION: No acute infarct.  Moderate small vessel disease type changes.  Moderate global atrophy without hydrocephalus.  Mild paranasal sinus mucosal thickening.  Partial opacification mastoid air cells greater on the left as noted above.  C3-4 bulge/protrusion with mild cord flattening.  MRA HEAD  Findings: Mild narrowing cavernous/supraclinoid segment of the right internal carotid artery.  Mild ectasia cavernous segment  left internal carotid artery.  Hypoplastic A1 segment right anterior cerebral artery.  Middle cerebral artery mild branch vessel irregularity bilaterally.  Left vertebral artery appears occluded at the foramen magnum level. Collateral flow with partial filling of the distal left vertebral artery.  This portion appears to have multiple tandem stenoses.  Nonvisualization left PICA.  Mild irregularity of the slightly ectatic right vertebral artery.  Mild to moderate narrowing involving portions of the basilar artery most notable mid aspect.  Moderate narrowing proximal left posterior cerebral artery.  Posterior cerebral artery and superior cerebral artery branch vessel irregularity.  No aneurysm noted.  IMPRESSION: Intracranial atherosclerotic type changes once again most notable involving the left vertebral artery.  Please see above.   Original Report Authenticated By: Lacy Duverney, M.D.   Mr Mra Head/brain Wo Cm  08/19/2012   *RADIOLOGY REPORT*  Clinical Data:  Episode of difficulty speaking and blurred vision which lasted for 1 hour.  Diabetic hypertensive smoker.  MRI BRAIN WITHOUT CONTRAST MRA HEAD WITHOUT CONTRAST  Technique: Multiplanar, multiecho pulse sequences of the brain and surrounding structures were obtained according to standard protocol without intravenous contrast.  Angiographic images of the head were obtained using MRA technique without contrast.  Comparison: 08/18/2012 CT.  12/17/2008 MR.  MRI HEAD  Findings:  No acute infarct.  No intracranial hemorrhage.  Moderate small vessel disease type changes.  Moderate global atrophy without hydrocephalus.  No intracranial mass lesion detected on this unenhanced exam.  Mild paranasal sinus mucosal thickening.  Partial opacification mastoid air cells greater on the left.  No definitive mass is seen in the posterior-superior nasopharynx causing left sided eustachian tube dysfunction.  There is minimal asymmetry of the posterior nasal pharyngeal region  which appears similar to the prior exam. To exclude subtle mucosal abnormality, direct visualization would be necessary.  C3-4 bulge/protrusion with mild cord flattening.  IMPRESSION: No acute infarct.  Moderate small vessel disease type changes.  Moderate global atrophy without hydrocephalus.  Mild paranasal sinus mucosal thickening.  Partial opacification mastoid air cells greater on the left as noted above.  C3-4 bulge/protrusion with mild cord flattening.  MRA HEAD  Findings: Mild narrowing cavernous/supraclinoid segment of the right internal carotid artery.  Mild ectasia cavernous segment left internal carotid artery.  Hypoplastic A1 segment right anterior cerebral artery.  Middle cerebral artery mild branch vessel irregularity bilaterally.  Left vertebral artery appears occluded at the foramen magnum level. Collateral flow with partial filling of the distal left vertebral artery.  This portion appears  to have multiple tandem stenoses.  Nonvisualization left PICA.  Mild irregularity of the slightly ectatic right vertebral artery.  Mild to moderate narrowing involving portions of the basilar artery most notable mid aspect.  Moderate narrowing proximal left posterior cerebral artery.  Posterior cerebral artery and superior cerebral artery branch vessel irregularity.  No aneurysm noted.  IMPRESSION: Intracranial atherosclerotic type changes once again most notable involving the left vertebral artery.  Please see above.   Original Report Authenticated By: Lacy Duverney, M.D.    2D ECHO:  Study Conclusions  - Left ventricle: Wall thickness was increased in a pattern of moderate LVH. Systolic function was normal. The estimated ejection fraction was in the range of 50% to 55%. Wall motion was normal; there were no regional wall motion abnormalities. Doppler parameters are consistent with abnormal left ventricular relaxation (grade 1 diastolic dysfunction). - Left atrium: The atrium was mildly dilated. -  Right atrium: The atrium was mildly dilated. Impressions:  - No cardiac source of emboli was indentified.   Disposition and Follow-up:     Discharge Orders   Future Appointments Provider Department Dept Phone   08/22/2012 9:10 AM [facility] Main Office [place]) [phone]   09/04/2012 10:45 AM Barbaraann Share, MD [facility] [phone]   09/15/2012 11:40 AM [facility] [phone]   10/22/2012 10:30 AM [facility] [employer] [employer] [facility] Main Office [place]) [phone]   Future Orders Complete By Expires   Diet Carb Modified  As directed    Increase activity slowly  As directed        DISPOSITION: Home  DIET: carb Modified diet    DISCHARGE FOLLOW-UP Follow-up Information   Follow up with Benita Stabile, MD. Schedule an appointment as soon as possible for a visit in 10 days. (for hospital follow-up)    Specialty:  Family Medicine   Contact information:   [facility] [address] [place] [address] [phone]       Follow up with Gates Rigg, MD. Schedule an appointment as soon as possible for a visit in 2 months. (for TIA follow-up)    Specialties:  Neurology, Radiology   Contact information:   [address] [place] [address] [phone]       Time spent on Discharge: 40 mins  Signed:   Yarexi Pawlicki M.D. [facility] 08/20/2012, 1:53 PM Pager: [phone]

## 2012-08-20 NOTE — Progress Notes (Signed)
Stroke Team Progress Note  HISTORY Joshua Vincent is an 78 y.o. male history hypertension, hyperlipidemia, diabetes mellitus and congestive heart failure, as well as previous stroke, admitted following an episode of transient aphasia. Patient's speech output was nonsensical. He did not realize what he was saying make no sense. He can understand what was was saying to him. Onset was at noon today and symptoms lasted about one hour. Patient has been taking aspirin and Plavix daily. CT scan of his head showed no acute intracranial abnormality. NIH stroke score was 0.   Patient was not a TPA candidate secondary to resolution of symptoms. He was admitted for further evaluation and treatment.  SUBJECTIVE  Feels stable. Anticipating discharge.   OBJECTIVE Most recent Vital Signs: Filed Vitals:   08/19/12 1520 08/19/12 1815 08/19/12 2056 08/20/12 0558  BP: 157/68 135/60 167/76 148/62  Pulse: 70 74 70 75  Temp: 98.9 F (37.2 C) 98.5 F (36.9 C) 97.9 F (36.6 C) 98.2 F (36.8 C)  TempSrc: Oral Oral Oral Oral  Resp: 20 20 20 20   Height:      Weight:      SpO2: 97% 97% 99% 98%   CBG (last 3)   Recent Labs  08/19/12 1656 08/19/12 2127 08/20/12 0640  GLUCAP 180* 182* 131*    IV Fluid Intake:     MEDICATIONS  . amLODipine  5 mg Oral Daily  . aspirin EC  81 mg Oral Daily  . atorvastatin  80 mg Oral QHS  . carvedilol  12.5 mg Oral BID WC  . clopidogrel  75 mg Oral Q breakfast  . doxazosin  8 mg Oral QHS  . furosemide  40 mg Oral BID  . insulin aspart  0-9 Units Subcutaneous TID WC  . insulin glargine  20 Units Subcutaneous Daily  . isosorbide mononitrate  90 mg Oral Daily  . potassium chloride SA  20 mEq Oral BID  . sodium chloride  3 mL Intravenous Q12H   PRN:  sodium chloride, sodium chloride  Diet:  Carb Control carb modified liquids Activity:  Bedrest DVT Prophylaxis:  SCD  CLINICALLY SIGNIFICANT STUDIES Basic Metabolic Panel:   Recent Labs Lab 08/18/12 1604  08/20/12 0640  NA 135 141  K 4.2 3.8  CL 104 108  CO2 22 24  GLUCOSE 250* 140*  BUN 29* 28*  CREATININE 2.16* 2.36*  CALCIUM 8.6 9.2   Liver Function Tests:   Recent Labs Lab 08/18/12 1604  AST 12  ALT 11  ALKPHOS 76  BILITOT 0.4  PROT 5.3*  ALBUMIN 2.4*   CBC:   Recent Labs Lab 08/18/12 1604 08/20/12 0640  WBC 5.8 4.9  NEUTROABS 4.1  --   HGB 11.3* 11.5*  HCT 32.7* 33.2*  MCV 83.6 83.0  PLT 154 164   Coagulation:   Recent Labs Lab 08/18/12 1604  LABPROT 13.7  INR 1.07   Cardiac Enzymes:   Recent Labs Lab 08/18/12 1604  TROPONINI <0.30   Urinalysis: No results found for this basename: COLORURINE, APPERANCEUR, LABSPEC, PHURINE, GLUCOSEU, HGBUR, BILIRUBINUR, KETONESUR, PROTEINUR, UROBILINOGEN, NITRITE, LEUKOCYTESUR,  in the last 168 hours Lipid Panel    Component Value Date/Time   CHOL 243* 08/19/2012 0505   TRIG 139 08/19/2012 0505   HDL 32* 08/19/2012 0505   CHOLHDL 7.6 08/19/2012 0505   VLDL 28 08/19/2012 0505   LDLCALC 183* 08/19/2012 0505   HgbA1C  Lab Results  Component Value Date   HGBA1C 7.0* 08/19/2012    Urine Drug  Screen:   No results found for this basename: labopia,  cocainscrnur,  labbenz,  amphetmu,  thcu,  labbarb    Alcohol Level: No results found for this basename: ETH,  in the last 168 hours  Ct Head (brain) Wo Contrast 08/18/2012  Atrophy with patchy periventricular small vessel disease.  There is no apparent mass, hemorrhage, or acute appearing infarct.  There is mild mucosal thickening in the left maxillary antrum.     MRI of the brain No acute infarct.   MRA of the brain  Intracranial atherosclerotic type changes once again most notable involving the left vertebral artery   2D Echocardiogram EF 55%, no cardiac source of emboli identified.   Carotid Doppler Bilateral: 1-39% ICA stenosis. Vertebral artery flow is antegrade. Left vertebral artery waveform is diminished and spiked suggesting distal obstruction  CXR    EKG   RBBB.   Therapy Recommendations no follow uup  Physical Exam   Pleasant elderly Caucasian male not in distress.Awake alert. Afebrile. Head is nontraumatic. Neck is supple without bruit. Hearing is normal. Cardiac exam no murmur or gallop. Lungs are clear to auscultation. Distal pulses are well felt. Neurological Exam ;  Awake  Alert oriented x 3. Normal speech and language.eye movements full without nystagmus.fundi were not visualized. Vision acuity and fields appear normal. Hearing is normal. Palatal movements are normal. Face symmetric. Tongue midline. Normal strength, tone, reflexes and coordination. Normal sensation. Gait deferred.  ASSESSMENT [name] is a 78 y.o. male presenting with transient aphasia. CT imaging confirms a small vessel disease. MRI to be done today. Infarct felt to be thrombotic, small vessel disease.  On aspirin 81 mg orally every day and clopidogrel 75 mg orally every day prior to admission. Now on aspirin 81 mg orally every day and clopidogrel 75 mg orally every day for secondary stroke prevention. Patient with resultant transient aphasia. Work up underway.   Hyperlipidemia, LDL 183, goal < 70 in diabetic patients--on lipitor  Hypertension  Diabetes mellitus, hgb a1c 6.9  Long term medication use  History of tobacco use, cessation  Previous CVA, mid brain, diplopia-resolved  CKD with Cr in June 2014 at 2.2  Bladder Cancer resection 2011  Hospital day # 2  TREATMENT/PLAN  Continue aspirin 81 mg orally every day and clopidogrel 75 mg orally every day for secondary stroke prevention.  Risk factor modification  No further ST/PT felt needed.  Follow up with Dr. Pearlean Brownie in 2 months.  Gwendolyn Lima. Manson Passey, [facility], MBA, MHA [facility] Pager: [phone] 08/20/2012 8:16 AM  I have personally obtained a history, examined the patient, evaluated imaging results, and formulated the assessment and plan of care. I agree with the above.  Delia Heady, MD

## 2012-08-21 ENCOUNTER — Telehealth: Payer: Self-pay

## 2012-08-21 NOTE — Telephone Encounter (Signed)
Called patient to review meds and patient asked that I call back in a few hours as he is not awake yet

## 2012-08-21 NOTE — Telephone Encounter (Signed)
I reviewed patient's medications with him.  Patient was incorrectly taking Coreg.  Patient was taking Coreg 12.5 mg tabs 1 1/2 tabs BID.  Patient was prescribed Coreg 12.5 mg BID.  I advised patient of change.  Patient reports taking all other medications per instruction.  Patient states he is feeling well and was advised to call our office with questions or concerns.  Patient verbalized understanding of all instructions.

## 2012-08-21 NOTE — Telephone Encounter (Signed)
Phone call from Ramiro Harvest at [facility] [phone]. She states patient was in the hospital from Monday to Wednesday with TIA and she just wants the okay to resume care orders with the patient. Please advise.

## 2012-08-21 NOTE — Progress Notes (Signed)
UR COMPLETED  

## 2012-08-21 NOTE — Telephone Encounter (Signed)
If he was taking Coreg 18.75 mg bid without problems, he can continue at that dose rather than going back to 12.5 mg bid. Marland Kitchen

## 2012-08-22 ENCOUNTER — Telehealth: Payer: Self-pay | Admitting: Cardiology

## 2012-08-22 ENCOUNTER — Other Ambulatory Visit (INDEPENDENT_AMBULATORY_CARE_PROVIDER_SITE_OTHER): Payer: Medicare Other

## 2012-08-22 DIAGNOSIS — E875 Hyperkalemia: Secondary | ICD-10-CM

## 2012-08-22 LAB — BASIC METABOLIC PANEL
BUN: 28 mg/dL — ABNORMAL HIGH (ref 6–23)
CO2: 25 mEq/L (ref 19–32)
Calcium: 8.9 mg/dL (ref 8.4–10.5)
Chloride: 105 mEq/L (ref 96–112)
Creatinine, Ser: 2 mg/dL — ABNORMAL HIGH (ref 0.4–1.5)
GFR: 34.52 mL/min — ABNORMAL LOW (ref >60.00)
Glucose, Bld: 80 mg/dL (ref 70–99)
Potassium: 3.5 mEq/L (ref 3.5–5.1)
Sodium: 136 mEq/L (ref 135–145)

## 2012-08-22 MED ORDER — POTASSIUM CHLORIDE CRYS ER 20 MEQ PO TBCR: 20.0000 meq | EXTENDED_RELEASE_TABLET | Freq: Two times a day (BID) | ORAL | Status: DC

## 2012-08-22 NOTE — Telephone Encounter (Signed)
Advised patient

## 2012-08-22 NOTE — Telephone Encounter (Signed)
New problem   Joshua Vincent/[facility] stated pt's bp is 194/107.

## 2012-08-22 NOTE — Telephone Encounter (Signed)
Left message to call back  

## 2012-08-22 NOTE — Telephone Encounter (Signed)
Left a message for Ramiro Harvest [phone], okay to resume care.

## 2012-08-22 NOTE — Telephone Encounter (Signed)
Ok to resume care 

## 2012-08-22 NOTE — Telephone Encounter (Signed)
Spoke with monitoring rep/ no new bp has been taken again, this reading was from early this morning,  Told her I would pass the results along/ recheck bp and call with further concerns. I called the pt, he was seen by PCP and no meds were changed. bp was 170/100? Pt told to recheck now/ 107/52, meds reviewed and is taking as prescribed. Told to call with elevated readings. Pt verbalized understanding.

## 2012-08-25 ENCOUNTER — Ambulatory Visit: Payer: Self-pay | Admitting: Physician Assistant

## 2012-08-25 ENCOUNTER — Telehealth: Payer: Self-pay | Admitting: Nurse Practitioner

## 2012-08-25 NOTE — Telephone Encounter (Signed)
Received call from Thayer Ohm, RN from [facility] stating that pts weight is up 6 lbs since yesterday.  He is currently asymptomatic w/o dyspnea.  He has been on lasix 40 mg TID since his recent d/c from the hospital but was due to drop to 40mg  BID today.  Yesterday he went out to eat at a restaurant and may have overindulged.  I rec that he take an additional 40mg  of Lasix with his evening dose tonight (total of 80mg  tonight) and that he weigh in the AM.  If he remains above his dry weight, he or the [facility] is to call back for further direction tomorrow.  HHRN verbalized understanding.

## 2012-08-26 ENCOUNTER — Telehealth: Payer: Self-pay

## 2012-08-26 ENCOUNTER — Other Ambulatory Visit: Payer: Self-pay

## 2012-08-26 DIAGNOSIS — I5033 Acute on chronic diastolic (congestive) heart failure: Secondary | ICD-10-CM

## 2012-08-26 MED ORDER — FUROSEMIDE 40 MG PO TABS: ORAL_TABLET | ORAL | Status: DC

## 2012-08-26 MED ORDER — HYDRALAZINE HCL 50 MG PO TABS: 50.0000 mg | ORAL_TABLET | Freq: Three times a day (TID) | ORAL | Status: DC

## 2012-08-26 NOTE — Telephone Encounter (Addendum)
LMTCB.  Per Dr Shirlee Latch and due to pts weight gain and elevated BP (see phone note from 8/18 with Flavia Shipper, NP), pt needs to increase Hydralazine to 50 mg TID and take 80 mg of Lasix in the mornings and 40 mg in the afternoons, have a BMET next Monday by [facility] and to work pt into schedule next week on 8/25, 8/26 or 8/27.  Thayer Ohm [phone]), pts HHN, is aware of all of Dr [name] recommendations and states she will draw a BMET next Monday (8/25)

## 2012-08-26 NOTE — Telephone Encounter (Signed)
Pt advised of Dr [name] recommendations. The pt wrote instructions down and repeated them back to me correctly. Also, per pt request, an appt scheduled for 8/26 @ 2:30 to f/u with Dr Shirlee Latch.

## 2012-09-01 ENCOUNTER — Telehealth: Payer: Self-pay | Admitting: Cardiology

## 2012-09-01 NOTE — Telephone Encounter (Signed)
New problem    Adriana Reams want to request eval for speech therapy and social worker for his meds. Please call Thayer Ohm

## 2012-09-01 NOTE — Telephone Encounter (Signed)
OK per Dr Vergie Living aware.

## 2012-09-02 ENCOUNTER — Ambulatory Visit (INDEPENDENT_AMBULATORY_CARE_PROVIDER_SITE_OTHER): Payer: Medicare Other | Admitting: Cardiology

## 2012-09-02 ENCOUNTER — Encounter: Payer: Self-pay | Admitting: Cardiology

## 2012-09-02 VITALS — BP 132/80 | HR 72 | Ht 71.0 in | Wt 203.0 lb

## 2012-09-02 DIAGNOSIS — I10 Essential (primary) hypertension: Secondary | ICD-10-CM

## 2012-09-02 DIAGNOSIS — I2510 Atherosclerotic heart disease of native coronary artery without angina pectoris: Secondary | ICD-10-CM

## 2012-09-02 DIAGNOSIS — E785 Hyperlipidemia, unspecified: Secondary | ICD-10-CM

## 2012-09-02 DIAGNOSIS — I5032 Chronic diastolic (congestive) heart failure: Secondary | ICD-10-CM

## 2012-09-02 MED ORDER — ATORVASTATIN CALCIUM 40 MG PO TABS: 40.0000 mg | ORAL_TABLET | Freq: Every day | ORAL | Status: DC

## 2012-09-02 NOTE — Patient Instructions (Addendum)
Start atorvastatin 40mg  daily. This is for your cholesterol.  Your physician recommends that you schedule a follow-up appointment in: 1 month in the [facility] clinic on a day Dr Shirlee Latch is there.   Your physician recommends that you return for a FASTING lipid profile /liver profile in 2 months.

## 2012-09-02 NOTE — Progress Notes (Signed)
Patient ID: Joshua Vincent, male   DOB: 1934-07-19, 78 y.o.   MRN: [id number] PCP: Dr. Clovis Riley  78 yo with history of diastolic CHF, CAD s/p NSTEMI in 12/10 accompanied by pulmonary edema and peri-catheterization CVA, now s/p CABG presents for followup.  He was admitted in 8/13 with NSTEMI. LHC showed severe disease in the RCA and occlusion of the LAD and LCX.  SVG-D and SVG-RCA were occluded.  SVG-OM and LIMA-LAD were patent.  EF was 45% by LV-gram but echo done in 9/13 showed EF 55-60% . He was managed medically.  He was readmitted in 9/13 with hypertensive emergency and acute on chronic diastolic CHF.  He had not been taking his medications due to financial strains.  He was admitted and diuresed and discharged on an adjusted regimen to try to control medication costs.  He was admitted in 6/14 with NSTEMI (TnI to 12) and acute systolic CHF.  EF was 15% by echo (newly decreased).  He was begun on milrinone and IV Lasix with good diuresis.  LHC showed stable anatomy with patent LIMA-LAD and SVG-OM.  There was severe diffuse disease in small PDA and small PLV.  No intervention.  He was also admitted in 8/14 with transient aphasia.  MRI did not show acute infarct.  Suspected TIA secondary to small vessel disease.  Echo in 8/14 showed surprising EF recovery to 50-55%.   Since most recent discharge, patient has been stable.  He has been taking most of his medications.  BP is controlled today.  He has apparently not been on his statin (LDL was 183 this month).  He as been doing some walking.  He is short of breath after about 100 yards, which is an improvement.  Weight is up 2 lbs compared to prior appointment.  No chest pain.  No orthopnea or PND. He has not wanted to do cardiac rehab due to transportation issues.   ECG: NSR, LAFB, RBBB  Labs (5/11): BNP 520, K 4.1, creatinine 1.0 Labs (8/11): BNP 120, HDL 34, LDL 148 Labs (9/11): K 4.7, creatinine 1.2 Labs (12/11): K 3.3 => 5.3, creatinine 0.9 => 1.3, BNP  918 => 199 Labs (2/12): K 3.9, creatinine 1.11 Labs (6/12): K 4.3, creatinine 1.0 Labs (8/13): LDL 142, HDL 40 (not taking statin) Labs (9/13): K 3.8, creatinine 1.46 => 1.6, BNP 385 Labs (12/13): K 4.1, creatinine 1.4, BNP 247, LDL 111, HDL 36 Labs (8/14): K 3.5, creatinine 2.0, HCT 33.2, LDL 183, HDL 32  Allergies (verified):  1)  ! Niacin  Past Medical History: 1. HYPERTENSION (ICD-401.9) 2. DYSLIPIDEMIA (ICD-272.4) 3. BENIGN PROSTATIC HYPERTROPHY, HX OF (ICD-V13.8) 4. DIABETIC PERIPHERAL NEUROPATHY (ICD-250.60) 5. DM (ICD-250.00) 6. OSTEOARTHRITIS, SHOULDER, RIGHT (ICD-715.91) 7. COPD: PFTs (1/14) with mild obstruction.  8. CAD: NSTEMI (12/10).  LHC showed sequential 90% mid LAD stenoses and 90% stenosis at the mid to distal LAD, D1 totally occluded, OM1 subtotally occluded, 70% mid CFX, EF 50%.  CABG (1/11): LIMA-LAD, SVG-D, SVG-OM, SVG-PLV.  NSTEMI 8/13 with LHC showing total occlusion of the LAD and LCX, 80-90% mPDA, 90% PLV, occluded SVG-RCA, occluded SVG-D, patent SVG-OM and patent LIMA-LAD.  He was managed medically. NSTEMI (6/14) with LHC near-identical anatomy to 8/13 cath.  No intervention.  9.  CVA: peri-catheterization in 12/10.  Patient initially developed double vision and MRI showed right dorsal mid-brain infarct.  Carotid dopplers with no significant disease.  Double vision has resolved. TIA (8/14) with transient aphasia.  Carotid dopplers (8/14) without significant disease and  MRI (8/14) with no acute infarct.   10.  Diastolic CHF: Pulmonary edema with NSTEMI.  Echo (12/10) showed EF 50-55% with mid to apical septal HK and mild mitral regurgitation.  Echo (3/11) when admitted with CHF exacerbation: Moderate LVH, EF 60%, valves ok.  LV-gram with EF 45% in 8/13.  Echo (9/13) with EF 55-60%, mild MR.  Echo (6/14): EF 15%.  Echo (8/14): EF 50-55%, moderate LVH, mild biatrial enlargement.  11. Bladder cancer s/p resection 7/11 12. 3 week event monitor (8/11): no significant  arrhythmias, occasional PVCs 13. CKD 14. Nocturnal hypoxemia 15. OSA: on CPAP.    Family History: Father died at age 61 from an embolic cerebrovascular accident from a deep vein thrombosis in his lower extremity.  His  brother died in his 60s from similar circumstances of an embolic CVA from a lower extremity deep vein thrombosis.  Mother died at age 95 without health problems prior to that.  The patient has a sister who died in her 35s from sequelae of a war injury.  He has a sister who is alive and is 70 years old with osteoarthritis.  He has 3 sons who are alive and healthy.   Social History: The patient is married.  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  He has had a distant smoking history, 50-pack years and quit 20 years ago.  He drinks alcohol occasionally.  He does not use drugs.   ROS:  All systems reviewed and negative except as per HPI.   Current Outpatient Prescriptions  Medication Sig Dispense Refill  . amLODipine (NORVASC) 5 MG tablet Take 1 tablet (5 mg total) by mouth daily.  30 tablet  6  . aspirin EC 81 MG tablet Take 81 mg by mouth daily.      . carvedilol (COREG) 12.5 MG tablet Take 12.5 mg by mouth as directed. 1 and 1/2 tablets twice a day      . Cholecalciferol (VITAMIN D PO) Take 1 tablet by mouth as needed.       . clopidogrel (PLAVIX) 75 MG tablet Take 1 tablet (75 mg total) by mouth daily with breakfast.  30 tablet  3  . Coenzyme Q10 (COQ-10) 100 MG CAPS Take 1 capsule by mouth daily.      Marland Kitchen doxazosin (CARDURA) 8 MG tablet Take 1 tablet (8 mg total) by mouth at bedtime.  90 tablet  3  . furosemide (LASIX) 40 MG tablet Take 80 mg in the am and 40 mg in the pm  270 tablet  1  . hydrALAZINE (APRESOLINE) 50 MG tablet Take 1 tablet (50 mg total) by mouth 3 (three) times daily.  270 tablet  1  . Insulin Glargine (LANTUS SOLOSTAR [place]) Inject 25 Units into the skin daily.       . isosorbide mononitrate (IMDUR) 60 MG 24 hr tablet Take  1.5 tablets (90 mg total) by mouth daily.  45 tablet  3  . nitroGLYCERIN (NITROSTAT) 0.4 MG SL tablet Place 0.4 mg under the tongue every 5 (five) minutes as needed for chest pain.      . potassium chloride SA (K-DUR,KLOR-CON) 20 MEQ tablet Take 1 tablet (20 mEq total) by mouth 2 (two) times daily.  60 tablet  6  . atorvastatin (LIPITOR) 40 MG tablet Take 1 tablet (40 mg total) by mouth daily.  30 tablet  3   No current facility-administered medications for this visit.  BP 132/80  Pulse 72  Ht 5\' 11"  (1.803 m)  Wt 92.08 kg (203 lb)  BMI 28.33 kg/m2  SpO2 99% General:  Well developed, well nourished, in no acute distress. Neck:  Neck supple, JVP 7 cm. No masses, thyromegaly or abnormal cervical nodes. Lungs:  Clear bilaterally to auscultation and percussion. Heart:  Non-displaced PMI, chest non-tender; regular rate and rhythm, S1, S2 without murmurs, rubs or gallops. Carotid upstroke normal, no bruit.  1+ edema at ankles. Abdomen:  Bowel sounds positive; abdomen soft and non-tender without masses, organomegaly, or hernias noted. No hepatosplenomegaly. Extremities:  No clubbing or cyanosis. Neurologic:  Alert and oriented x 3. Psych:  Normal affect.  Assessment/Plan: 1. CAD: Status post CABG.  LHC 6/14 in setting of NSTEMI showed 2/4 patent grafts.  There was no change from 8/13 LHC (apparently a Type II NSTEMI).  He was managed medically. No further chest pain.  Continue ASA 81, Coreg.  Restart statin.  2. Chronic diastolic CHF: EF low normal on 8/14 echo (improved from 15%).  NYHA class II-III symptoms.  Volume looks stable.  He does not appear significantly overloaded.  Continue Lasix 80 qam, 40 qpm   3. CKD: Creatinine stable but overall has trended higher over the last year.   4. HTN: BP controlled today.  Patient states that it has been ok for the most part at home.  He has been writing down his home readings (did not bring today).  We will call him to get a list of readings to make  sure that BP is under control.   5. Hyperlipidemia: Restart atorvastatin 40 mg daily with lipids/LFTs in 2 months.   6. OSA: Using CPAP.  7. COPD: Mild obstructive defect on PFTs.  Prior smoker.  8. TIA: Recent admission with TIA (aphasia).  He is on ASA and Plavix.  Workup in hospital suggested small vessel disease. Carotid dopplers did not show significant disease.   Marca Ancona 09/02/2012

## 2012-09-03 ENCOUNTER — Encounter (HOSPITAL_COMMUNITY): Payer: Self-pay

## 2012-09-03 ENCOUNTER — Ambulatory Visit: Payer: Self-pay | Admitting: Cardiology

## 2012-09-03 ENCOUNTER — Telehealth: Payer: Self-pay | Admitting: *Deleted

## 2012-09-03 DIAGNOSIS — I5032 Chronic diastolic (congestive) heart failure: Secondary | ICD-10-CM

## 2012-09-03 DIAGNOSIS — I2510 Atherosclerotic heart disease of native coronary artery without angina pectoris: Secondary | ICD-10-CM

## 2012-09-03 NOTE — Telephone Encounter (Signed)
Pt's wife notified.

## 2012-09-03 NOTE — Telephone Encounter (Signed)
BP readings since  08/22/12 194/105  107/52  08/23/12 157/90   8/17 182/94   8/18 188/86   8/19 214/98   8/20 138/88   8/21 192/82   8/22 193/89  8/23 178/80   8/24 164/72   8/25 174/79   8/26 178/87 all these readings except 107/52 08/22/12 were done before he took his medication. I will forward to Dr Shirlee Latch for review.

## 2012-09-03 NOTE — Telephone Encounter (Signed)
Take medications in the morning then take BP a couple of hours after meds.  Call him in a couple of weeks to get readings.

## 2012-09-04 ENCOUNTER — Telehealth: Payer: Self-pay | Admitting: Cardiology

## 2012-09-04 ENCOUNTER — Encounter: Payer: Self-pay | Admitting: Pulmonary Disease

## 2012-09-04 ENCOUNTER — Ambulatory Visit (INDEPENDENT_AMBULATORY_CARE_PROVIDER_SITE_OTHER): Payer: Medicare Other | Admitting: Pulmonary Disease

## 2012-09-04 VITALS — BP 112/56 | HR 69 | Temp 97.8°F | Ht 71.5 in | Wt 207.0 lb

## 2012-09-04 DIAGNOSIS — G4733 Obstructive sleep apnea (adult) (pediatric): Secondary | ICD-10-CM

## 2012-09-04 NOTE — Telephone Encounter (Signed)
Spoke with Thayer Ohm with [facility] to report Dr. Shirlee Latch ordered continued home care for 60 days.  Thayer Ohm will fax orders for Dr. Shirlee Latch to sign.  Forwarding message to Katina Dung, RN, Dr. [name] primary nurse so that she can watch for fax.

## 2012-09-04 NOTE — Patient Instructions (Addendum)
Try to increase heat on humidifier of cpap machine to see if this will increase moisture and help your issue with nasal congestion at bedtime.  If this does not work, can try dymista 2 sprays each nostril at bedtime to see if this will help. Try and wear cpap at night as much as possible given your underlying heart disease. Work on modest weight loss Will get your download from your home care company, and let you know the results. followup with me in 12mos if doing well, but call if issues with your cpap machine.

## 2012-09-04 NOTE — Telephone Encounter (Signed)
Continue homecare

## 2012-09-04 NOTE — Progress Notes (Signed)
  Subjective:    Patient ID: Joshua Vincent, male    DOB: Dec 31, 1934, 78 y.o.   MRN: [id number]  HPI Patient comes in today for followup of his obstructive sleep apnea.  At the last visit, we were trying to optimize his pressure on the automatic setting, but we never received a download from his home care company.  The patient has had issues with CPAP over the last one to 2 weeks, secondary to nasal congestion issues.  He has been wearing oxygen during the night, but I reminded him that this does not treat his sleep apnea.  Part of the issue is secondary to allergies, but also wonder about humidity problems.   Review of Systems  Constitutional: Negative for fever and unexpected weight change.  HENT: Negative for ear pain, nosebleeds, congestion, sore throat, rhinorrhea, sneezing, trouble swallowing, dental problem, postnasal drip and sinus pressure.   Eyes: Negative for redness and itching.  Respiratory: Negative for cough, chest tightness, shortness of breath and wheezing.   Cardiovascular: Negative for palpitations and leg swelling.  Gastrointestinal: Negative for nausea and vomiting.  Genitourinary: Negative for dysuria.  Musculoskeletal: Negative for joint swelling.  Skin: Negative for rash.  Neurological: Negative for headaches.  Hematological: Does not bruise/bleed easily.  Psychiatric/Behavioral: Negative for dysphoric mood. The patient is not nervous/anxious.        Objective:   Physical Exam Mildly overweight male in no acute distress Nose without purulence or discharge noted No skin breakdown or pressure necrosis from a CPAP mask Neck without lymphadenopathy or thyromegaly Lower extremities with mild edema, cyanosis Alert and oriented, moves all 4 extremities.       Assessment & Plan:

## 2012-09-04 NOTE — Telephone Encounter (Signed)
New Problem   Thayer Ohm [place]// [facility] [phone]  States that its time for pt to be re-certified for another 60 days of health care.. Needs the Dr.'s order to continue care.

## 2012-09-04 NOTE — Assessment & Plan Note (Signed)
The patient is having issues with CPAP tolerance because of nasal congestion.  I suspect this is because of humidity, and asked him to turn up the heater settings to some degree to see if this will help.  If it does not, then I would treat this as allergic rhinitis.  I have given him samples of diagnosed a try.  I reminded the patient that oxygen alone does not treat his sleep apnea, and that his sleep apnea can affect his cardiovascular disease.

## 2012-09-10 ENCOUNTER — Telehealth: Payer: Self-pay | Admitting: Cardiology

## 2012-09-10 NOTE — Telephone Encounter (Signed)
Pt states he has no new symptoms. He is using his CPAP at night without problems. [place] and will take his scales with him so he can continue to weigh daily. He states he discussed this with Dr Shirlee Latch at his last office visit. I will forward to Dr Shirlee Latch for review.

## 2012-09-10 NOTE — Telephone Encounter (Signed)
New Problem  Pt is going out of town spoke with [facility]// needs [facility] to ok that he goes out of town.///// gain 4lbs since 8/29.

## 2012-09-10 NOTE — Telephone Encounter (Signed)
Spoke with patient  --recent weights 8/29 200.4 lbs  8/30 202.8 lbs  8/31 201.2 lbs  9/1 203.4 lbs  9/2 203.2 lbs   9/3 no weight today. Pt ate out at [employer] yesterday.

## 2012-09-11 NOTE — Telephone Encounter (Signed)
Follow up    Status of yesterday message sent to MD - nurse for review regarding patient going out of town .

## 2012-09-11 NOTE — Telephone Encounter (Signed)
If symptoms are stable, may go to mtns.  Would make sure he has all his meds and access to a telephone.

## 2012-09-11 NOTE — Telephone Encounter (Signed)
Joshua Vincent from [facility] called today regarding the status of the message sent yesterday. Joshua Vincent said pt is planning [place] this [place]  a health facility.

## 2012-09-12 NOTE — Telephone Encounter (Signed)
Left message to call back  

## 2012-09-12 NOTE — Telephone Encounter (Signed)
I advised Joshua Vincent with [facility] of Dr. [name] advice. Thayer Ohm states patient will have his cell phone, has promised to take his scales for daily weights, and is going to call her daily to check in.

## 2012-09-15 ENCOUNTER — Encounter (HOSPITAL_COMMUNITY): Payer: Self-pay

## 2012-09-22 ENCOUNTER — Telehealth: Payer: Self-pay | Admitting: Cardiology

## 2012-09-22 NOTE — Telephone Encounter (Signed)
New problem   Joshua Vincent need to talk to nurse about discharging pt. Please call Thayer Ohm

## 2012-09-22 NOTE — Telephone Encounter (Signed)
Spoke w/nurse from [facility]. She states because pt went to mountains and on vacation for 3-4 days her supervisor says that he is no longer home bound and can not be on tele monitoring.  Therefore they will be discharging him from their care this week.

## 2012-09-23 NOTE — Telephone Encounter (Signed)
HTN: BP controlled today. Patient states that it has been ok for the most part at home. He has been writing down his home readings (did not bring today). We will call him to get a list of readings to make sure that BP is under control.   09/23/12 LMTCB to check on BP and weights

## 2012-10-01 ENCOUNTER — Other Ambulatory Visit (INDEPENDENT_AMBULATORY_CARE_PROVIDER_SITE_OTHER): Payer: Medicare Other

## 2012-10-01 DIAGNOSIS — I5033 Acute on chronic diastolic (congestive) heart failure: Secondary | ICD-10-CM

## 2012-10-01 DIAGNOSIS — I5032 Chronic diastolic (congestive) heart failure: Secondary | ICD-10-CM

## 2012-10-01 LAB — LIPID PANEL
Cholesterol: 148 mg/dL (ref 0–200)
LDL Cholesterol: 99 mg/dL (ref 0–99)

## 2012-10-01 LAB — HEPATIC FUNCTION PANEL
Alkaline Phosphatase: 73 U/L (ref 39–117)
Bilirubin, Direct: 0.1 mg/dL (ref 0.0–0.3)
Total Protein: 6.3 g/dL (ref 6.0–8.3)

## 2012-10-01 LAB — BASIC METABOLIC PANEL
BUN: 27 mg/dL — ABNORMAL HIGH (ref 6–23)
Calcium: 8.7 mg/dL (ref 8.4–10.5)
Chloride: 107 mEq/L (ref 96–112)
Creatinine, Ser: 2.2 mg/dL — ABNORMAL HIGH (ref 0.4–1.5)
GFR: 31.59 mL/min — ABNORMAL LOW (ref >60.00)

## 2012-10-02 ENCOUNTER — Encounter (HOSPITAL_COMMUNITY): Payer: Self-pay

## 2012-10-02 ENCOUNTER — Ambulatory Visit (HOSPITAL_COMMUNITY)
Admission: RE | Admit: 2012-10-02 | Discharge: 2012-10-02 | Disposition: A | Discharge status: Home / Self Care | Payer: Medicare Other | Source: Ambulatory Visit | Attending: Internal Medicine | Admitting: Internal Medicine

## 2012-10-02 VITALS — BP 148/52 | HR 74 | Ht 72.0 in | Wt 212.1 lb

## 2012-10-02 DIAGNOSIS — I2510 Atherosclerotic heart disease of native coronary artery without angina pectoris: Secondary | ICD-10-CM | POA: Insufficient documentation

## 2012-10-02 DIAGNOSIS — G4733 Obstructive sleep apnea (adult) (pediatric): Secondary | ICD-10-CM

## 2012-10-02 DIAGNOSIS — G459 Transient cerebral ischemic attack, unspecified: Secondary | ICD-10-CM

## 2012-10-02 DIAGNOSIS — I5032 Chronic diastolic (congestive) heart failure: Secondary | ICD-10-CM

## 2012-10-02 MED ORDER — ATORVASTATIN CALCIUM 80 MG PO TABS: 80.0000 mg | ORAL_TABLET | Freq: Every day | ORAL | Status: DC

## 2012-10-02 MED ORDER — POTASSIUM CHLORIDE CRYS ER 20 MEQ PO TBCR: EXTENDED_RELEASE_TABLET | ORAL | Status: DC

## 2012-10-02 NOTE — Progress Notes (Signed)
Patient ID: Joshua Vincent, male   DOB: 06-30-1934, 78 y.o.   MRN: [id number] PCP: Dr. Clovis Riley  78 yo with history of diastolic CHF, CAD s/p NSTEMI in 12/10 accompanied by pulmonary edema and peri-catheterization CVA, now s/p CABG presents for followup.  He was admitted in 8/13 with NSTEMI. LHC showed severe disease in the RCA and occlusion of the LAD and LCX.  SVG-D and SVG-RCA were occluded.  SVG-OM and LIMA-LAD were patent.  EF was 45% by LV-gram but echo done in 9/13 showed EF 55-60% . He was managed medically.  He was readmitted in 9/13 with hypertensive emergency and acute on chronic diastolic CHF.  He had not been taking his medications due to financial strains.  He was admitted and diuresed and discharged on an adjusted regimen to try to control medication costs.  He was admitted in 6/14 with NSTEMI (TnI to 12) and acute systolic CHF.  EF was 15% by echo (newly decreased).  He was begun on milrinone and IV Lasix with good diuresis.  LHC showed stable anatomy with patent LIMA-LAD and SVG-OM.  There was severe diffuse disease in small PDA and small PLV.  No intervention.  He was also admitted in 8/14 with transient aphasia.  MRI did not show acute infarct.  Suspected TIA secondary to small vessel disease.  Echo in 8/14 showed surprising EF recovery to 50-55%.   Follow up: Last visit started back on atorvastatin 40 mg daily. If he has nasal congestion will not wear CPAP, but usually wears every night. DOE with minimal exertion, about 100 ft (believes it is more related to his back). Has had TENS therapy in the past and it helped. Denies orthopnea or edema. + chest discomfort that comes and goes and not associated with any activity. SBP at home 130/60s.   Labs (5/11): BNP 520, K 4.1, creatinine 1.0 Labs (8/11): BNP 120, HDL 34, LDL 148 Labs (9/11): K 4.7, creatinine 1.2 Labs (12/11): K 3.3 => 5.3, creatinine 0.9 => 1.3, BNP 918 => 199 Labs (2/12): K 3.9, creatinine 1.11 Labs (6/12): K 4.3, creatinine  1.0 Labs (8/13): LDL 142, HDL 40 (not taking statin) Labs (9/13): K 3.8, creatinine 1.46 => 1.6, BNP 385 Labs (12/13): K 4.1, creatinine 1.4, BNP 247, LDL 111, HDL 36 Labs (8/14): K 3.5, creatinine 2.0, HCT 33.2, LDL 183, HDL 32 Labs (9/14): K 3.4, creatinine 2.2, cholesterol 148, HDL 35, LDL 99, AST 19, ALT 26  Allergies (verified):  1)  ! Niacin  Past Medical History: 1. HYPERTENSION (ICD-401.9) 2. DYSLIPIDEMIA (ICD-272.4) 3. BENIGN PROSTATIC HYPERTROPHY, HX OF (ICD-V13.8) 4. DIABETIC PERIPHERAL NEUROPATHY (ICD-250.60) 5. DM (ICD-250.00) 6. OSTEOARTHRITIS, SHOULDER, RIGHT (ICD-715.91) 7. COPD: PFTs (1/14) with mild obstruction.  8. CAD: NSTEMI (12/10).  LHC showed sequential 90% mid LAD stenoses and 90% stenosis at the mid to distal LAD, D1 totally occluded, OM1 subtotally occluded, 70% mid CFX, EF 50%.  CABG (1/11): LIMA-LAD, SVG-D, SVG-OM, SVG-PLV.  NSTEMI 8/13 with LHC showing total occlusion of the LAD and LCX, 80-90% mPDA, 90% PLV, occluded SVG-RCA, occluded SVG-D, patent SVG-OM and patent LIMA-LAD.  He was managed medically. NSTEMI (6/14) with LHC near-identical anatomy to 8/13 cath.  No intervention.  9.  CVA: peri-catheterization in 12/10.  Patient initially developed double vision and MRI showed right dorsal mid-brain infarct.  Carotid dopplers with no significant disease.  Double vision has resolved. TIA (8/14) with transient aphasia.  Carotid dopplers (8/14) without significant disease and MRI (8/14) with no acute infarct.  10.  Diastolic CHF: Pulmonary edema with NSTEMI.  Echo (12/10) showed EF 50-55% with mid to apical septal HK and mild mitral regurgitation.  Echo (3/11) when admitted with CHF exacerbation: Moderate LVH, EF 60%, valves ok.  LV-gram with EF 45% in 8/13.  Echo (9/13) with EF 55-60%, mild MR.  Echo (6/14): EF 15%.  Echo (8/14): EF 50-55%, moderate LVH, mild biatrial enlargement.  11. Bladder cancer s/p resection 7/11 12. 3 week event monitor (8/11): no significant  arrhythmias, occasional PVCs 13. CKD 14. Nocturnal hypoxemia 15. OSA: on CPAP.    Family History: Father died at age 69 from an embolic cerebrovascular accident from a deep vein thrombosis in his lower extremity.  His  brother died in his 63s from similar circumstances of an embolic CVA from a lower extremity deep vein thrombosis.  Mother died at age 36 without health problems prior to that.  The patient has a sister who died in her 76s from sequelae of a war injury.  He has a sister who is alive and is 41 years old with osteoarthritis.  He has 3 sons who are alive and healthy.   Social History: The patient is married.  He has been employed as a Electrical engineer.  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  He has had a distant smoking history, 50-pack years and quit 20 years ago.  He drinks alcohol occasionally.  He does not use drugs.   ROS:  All systems reviewed and negative except as per HPI.   Current Outpatient Prescriptions  Medication Sig Dispense Refill  . amLODipine (NORVASC) 5 MG tablet Take 1 tablet (5 mg total) by mouth daily.  30 tablet  6  . aspirin EC 81 MG tablet Take 81 mg by mouth daily.      Marland Kitchen atorvastatin (LIPITOR) 40 MG tablet Take 1 tablet (40 mg total) by mouth daily.  30 tablet  3  . carvedilol (COREG) 12.5 MG tablet Take 12.5 mg by mouth as directed. 1 and 1/2 tablets twice a day      . clopidogrel (PLAVIX) 75 MG tablet Take 1 tablet (75 mg total) by mouth daily with breakfast.  30 tablet  3  . doxazosin (CARDURA) 8 MG tablet Take 1 tablet (8 mg total) by mouth at bedtime.  90 tablet  3  . furosemide (LASIX) 40 MG tablet Take 80 mg in the am and 40 mg in the pm  270 tablet  1  . hydrALAZINE (APRESOLINE) 50 MG tablet Take 1 tablet (50 mg total) by mouth 3 (three) times daily.  270 tablet  1  . Insulin Glargine (LANTUS SOLOSTAR [place]) Inject 25 Units into the skin daily.       . isosorbide mononitrate (IMDUR) 60 MG 24 hr tablet Take 1.5 tablets (90 mg total) by mouth daily.   45 tablet  3  . nitroGLYCERIN (NITROSTAT) 0.4 MG SL tablet Place 0.4 mg under the tongue every 5 (five) minutes as needed for chest pain.      . potassium chloride SA (K-DUR,KLOR-CON) 20 MEQ tablet Take 1 tablet (20 mEq total) by mouth 2 (two) times daily.  60 tablet  6   No current facility-administered medications for this encounter.   Filed Vitals:   10/02/12 1128  BP: 148/52  Pulse: 74  Height: 6' (1.829 m)  Weight: 212 lb 1.9 oz (96.217 kg)  SpO2: 97%   General:  Well developed, well nourished, in no acute distress. Neck:  Neck supple,  JVP 7 cm. No masses, thyromegaly or abnormal cervical nodes. Lungs:  Clear bilaterally to auscultation and percussion. Heart:  Non-displaced PMI, chest non-tender; regular rate and rhythm, S1, S2 without murmurs, rubs or gallops. Carotid upstroke normal, no bruit. 1-2+ edema at ankles. Abdomen:  Bowel sounds positive; abdomen soft and non-tender without masses, organomegaly, or hernias noted. No hepatosplenomegaly. Extremities:  No clubbing or cyanosis. Neurologic:  Alert and oriented x 3. Psych:  Normal affect.  Assessment/Plan: 1. CAD: Status post CABG.  LHC 6/14 in setting of NSTEMI showed 2/4 patent grafts.  There was no change from 8/13 LHC (apparently a Type II NSTEMI).  He was managed medically. No exertional chest pain.  Continue ASA 81, Coreg, statin.   2. Chronic diastolic CHF: EF low normal on 8/14 echo (improved from 15%).  NYHA class II-III symptoms.  He does not appear significantly overloaded.  Continue Lasix 80 qam, 40 qpm.  Will increase KCl to 40 qam/20 qpm as K was low when recently checked.   3. CKD: Creatinine stable but overall has trended higher over the last year.    4. HTN: BP slightly elevated today, however reports at home SBP 130/60s. today. He has been writing down his home readings (did not bring today).    5. Hyperlipidemia: Cholesterol and LDL improved, LFTs ok. Will increase atorvastatin to 80 mg daily. Check CMET  and lipids in followup.  6. OSA: Using CPAP  7. COPD: Mild obstructive defect on PFTs.  Prior smoker.   8. TIA: Recent admission with TIA (aphasia).  He is on ASA and Plavix.  Workup in hospital suggested small vessel disease. Carotid dopplers did not show significant disease.   F/U 2 months  Marca Ancona 10/03/2012

## 2012-10-02 NOTE — Patient Instructions (Addendum)
Increase atorvastatin to 80 mg daily. Take 2 tablets daily until you run out and new prescription will be 80 mg tablets.   Increase potassium to 40 meq (2 tablets) in the am and 20 meq (1 tablet) in the pm.  Labs in 1 month at [facility]  Follow up 2 months here. Call any issues [phone].  Do the following things EVERYDAY: 1) Weigh yourself in the morning before breakfast. Write it down and keep it in a log. 2) Take your medicines as prescribed 3) Eat low salt foods-Limit salt (sodium) to 2000 mg per day.  4) Stay as active as you can everyday 5) Limit all fluids for the day to less than 2 liters 6)

## 2012-10-02 NOTE — Telephone Encounter (Signed)
Spoke with pt. Lab results and MD's recommendations given. Pt is aware to eat bananas for his low K+ and to have BMET check  in 2 weeks (Oct 9 th) pt is aware that someone from this office will call pt for appointment.Pt verbalized understanding.

## 2012-10-02 NOTE — Telephone Encounter (Signed)
F/u    Pt returning nurse call concerning lab results

## 2012-10-03 ENCOUNTER — Encounter: Payer: Self-pay | Admitting: Cardiology

## 2012-10-03 ENCOUNTER — Telehealth: Payer: Self-pay | Admitting: Cardiology

## 2012-10-03 NOTE — Telephone Encounter (Signed)
Spoke with Thayer Ohm from [facility] who states she got an email regarding an increase in patient's weight of 3.2 lb in one day, from 206 lb to 209.2 lb.  Thayer Ohm states the LPN from [facility] called [place] earlier to state that patient is not taking his potassium supplement and patient nor nurse know why he has stopped.  Thayer Ohm states that patient frequently stops taking his meds without any reason and that when they give him explicit instructions the patient notes them but then later does not follow the instructions and denies having received them.  I advised Thayer Ohm that I am sending message to Dr. Shirlee Latch for advice and that I will call her back before the end of the work day.  Thayer Ohm verbalized agreement.

## 2012-10-03 NOTE — Telephone Encounter (Signed)
I spoke with Dr. Shirlee Latch who advised no changes in therapy or medication; Dr. Shirlee Latch saw patient was seen in [facility] yesterday and he did not appear fluid overloaded.

## 2012-10-03 NOTE — Telephone Encounter (Signed)
Follow Up:  [facility] RN states pt has 3 pound wt gain. 206 lbs yesterday and 209.2 lbs today.

## 2012-10-03 NOTE — Telephone Encounter (Signed)
New Problem:  RN states pt has not been taking K+. RN wants to know if the doctor still wants pt to take K+. RN would like some clarification

## 2012-10-07 ENCOUNTER — Telehealth: Payer: Self-pay | Admitting: Cardiology

## 2012-10-07 NOTE — Telephone Encounter (Signed)
New Problem  Pt was discharged from home health care 9/30. No longer home bound.

## 2012-10-07 NOTE — Telephone Encounter (Signed)
Please work on continuing [facility].

## 2012-10-07 NOTE — Telephone Encounter (Signed)
[facility] from [facility], called to let DR. Mclean know that pt was d/C from their care, because according to medicare protocol; if pt is not homebound he/she 's  home care visits need to be discontinue. Thayer Ohm states pt needs to continue to be seen because, he still has the 30 lbs wt gain  Since he was in  the hospital. Pt  is not home bound ,but he is not  medically stable. Pt  is on continues monitored and the alarm rings several times in a day. Kathlynn Grate  if MD would like for the [facility] to continue, MD's nurse need to call [facility]  Office, and  They will see what they can do to restart the visits.

## 2012-10-08 ENCOUNTER — Telehealth: Payer: Self-pay | Admitting: Cardiology

## 2012-10-08 NOTE — Telephone Encounter (Signed)
Thayer Ohm asked me to call Harlon Flor case manager at [facility] [phone]

## 2012-10-08 NOTE — Telephone Encounter (Signed)
See phone note 10/08/12

## 2012-10-08 NOTE — Telephone Encounter (Signed)
Spoke with Chris

## 2012-10-08 NOTE — Telephone Encounter (Signed)
Thurston Hole, let me know if [facility] can not continue his care and I can enroll him in our paramedicine program

## 2012-10-08 NOTE — Telephone Encounter (Signed)
LMTCB for Joshua Vincent, case manager.

## 2012-10-08 NOTE — Telephone Encounter (Signed)
New Problem:  Thayer Ohm RN states she spoke with a Adult nurse nurse earlier today and told her to contact kimberly regarding the pt. Thayer Ohm RN states Cala Bradford will be out of the office and the nurse should contact Gina at [phone] with concerns/patient information.

## 2012-10-08 NOTE — Telephone Encounter (Signed)
Joshua Vincent at [facility] states they can no longer provide home health services since patient is no longer considered homebound.

## 2012-10-09 NOTE — Telephone Encounter (Signed)
Will enroll pt in paramedicine program

## 2012-10-22 ENCOUNTER — Other Ambulatory Visit: Payer: Self-pay

## 2012-10-22 ENCOUNTER — Ambulatory Visit (INDEPENDENT_AMBULATORY_CARE_PROVIDER_SITE_OTHER): Payer: Medicare Other | Admitting: *Deleted

## 2012-10-22 ENCOUNTER — Telehealth: Payer: Self-pay | Admitting: *Deleted

## 2012-10-22 DIAGNOSIS — I10 Essential (primary) hypertension: Secondary | ICD-10-CM

## 2012-10-22 DIAGNOSIS — E785 Hyperlipidemia, unspecified: Secondary | ICD-10-CM

## 2012-10-22 LAB — HEPATIC FUNCTION PANEL
AST: 19 U/L (ref 0–37)
Total Bilirubin: 0.5 mg/dL (ref 0.3–1.2)

## 2012-10-22 LAB — LIPID PANEL
HDL: 31 mg/dL — ABNORMAL LOW (ref >39.00)
Total CHOL/HDL Ratio: 5

## 2012-10-22 NOTE — Telephone Encounter (Signed)
Dr. [name] Pt came in for Blood work;  while he was waiting pt C/O of having hip pain. Pt states has been having this pain for the past week. Pt denies chest pain , nor SOB. Pt was advice to call his PCP to evaluate his symptoms. Pt verbalized understanding.

## 2012-10-27 ENCOUNTER — Telehealth: Payer: Self-pay | Admitting: Cardiology

## 2012-10-27 NOTE — Telephone Encounter (Signed)
Follow Up: ° ° ° °Pt returning call for results.  °

## 2012-10-27 NOTE — Telephone Encounter (Signed)
Pt called for labs results. Lab results and MD's recommendations given.

## 2012-11-07 ENCOUNTER — Other Ambulatory Visit: Payer: Self-pay

## 2012-11-09 ENCOUNTER — Inpatient Hospital Stay (HOSPITAL_COMMUNITY)
Admission: EM | Admit: 2012-11-09 | Discharge: 2012-11-11 | DRG: 293 | Disposition: A | Discharge status: Home Health | Payer: Medicare Other | Attending: Cardiology | Admitting: Cardiology

## 2012-11-09 ENCOUNTER — Emergency Department (HOSPITAL_COMMUNITY): Payer: Medicare Other

## 2012-11-09 ENCOUNTER — Encounter (HOSPITAL_COMMUNITY): Payer: Self-pay | Admitting: Emergency Medicine

## 2012-11-09 DIAGNOSIS — N400 Enlarged prostate without lower urinary tract symptoms: Secondary | ICD-10-CM | POA: Diagnosis present

## 2012-11-09 DIAGNOSIS — I059 Rheumatic mitral valve disease, unspecified: Secondary | ICD-10-CM | POA: Diagnosis present

## 2012-11-09 DIAGNOSIS — I509 Heart failure, unspecified: Secondary | ICD-10-CM

## 2012-11-09 DIAGNOSIS — I214 Non-ST elevation (NSTEMI) myocardial infarction: Secondary | ICD-10-CM

## 2012-11-09 DIAGNOSIS — Z8673 Personal history of transient ischemic attack (TIA), and cerebral infarction without residual deficits: Secondary | ICD-10-CM

## 2012-11-09 DIAGNOSIS — I129 Hypertensive chronic kidney disease with stage 1 through stage 4 chronic kidney disease, or unspecified chronic kidney disease: Secondary | ICD-10-CM | POA: Diagnosis present

## 2012-11-09 DIAGNOSIS — Z79899 Other long term (current) drug therapy: Secondary | ICD-10-CM

## 2012-11-09 DIAGNOSIS — I169 Hypertensive crisis, unspecified: Secondary | ICD-10-CM | POA: Diagnosis present

## 2012-11-09 DIAGNOSIS — E1142 Type 2 diabetes mellitus with diabetic polyneuropathy: Secondary | ICD-10-CM | POA: Diagnosis present

## 2012-11-09 DIAGNOSIS — Z8551 Personal history of malignant neoplasm of bladder: Secondary | ICD-10-CM

## 2012-11-09 DIAGNOSIS — I951 Orthostatic hypotension: Secondary | ICD-10-CM

## 2012-11-09 DIAGNOSIS — I2510 Atherosclerotic heart disease of native coronary artery without angina pectoris: Secondary | ICD-10-CM | POA: Diagnosis present

## 2012-11-09 DIAGNOSIS — I5043 Acute on chronic combined systolic (congestive) and diastolic (congestive) heart failure: Principal | ICD-10-CM | POA: Diagnosis present

## 2012-11-09 DIAGNOSIS — Z794 Long term (current) use of insulin: Secondary | ICD-10-CM

## 2012-11-09 DIAGNOSIS — Z8249 Family history of ischemic heart disease and other diseases of the circulatory system: Secondary | ICD-10-CM

## 2012-11-09 DIAGNOSIS — I5042 Chronic combined systolic (congestive) and diastolic (congestive) heart failure: Secondary | ICD-10-CM | POA: Diagnosis present

## 2012-11-09 DIAGNOSIS — Z8261 Family history of arthritis: Secondary | ICD-10-CM

## 2012-11-09 DIAGNOSIS — I2589 Other forms of chronic ischemic heart disease: Secondary | ICD-10-CM | POA: Diagnosis present

## 2012-11-09 DIAGNOSIS — N183 Chronic kidney disease, stage 3 unspecified: Secondary | ICD-10-CM | POA: Diagnosis present

## 2012-11-09 DIAGNOSIS — Z9089 Acquired absence of other organs: Secondary | ICD-10-CM

## 2012-11-09 DIAGNOSIS — Z87891 Personal history of nicotine dependence: Secondary | ICD-10-CM

## 2012-11-09 DIAGNOSIS — N189 Chronic kidney disease, unspecified: Secondary | ICD-10-CM

## 2012-11-09 DIAGNOSIS — Z951 Presence of aortocoronary bypass graft: Secondary | ICD-10-CM

## 2012-11-09 DIAGNOSIS — E1149 Type 2 diabetes mellitus with other diabetic neurological complication: Secondary | ICD-10-CM | POA: Diagnosis present

## 2012-11-09 DIAGNOSIS — I5033 Acute on chronic diastolic (congestive) heart failure: Secondary | ICD-10-CM

## 2012-11-09 DIAGNOSIS — I255 Ischemic cardiomyopathy: Secondary | ICD-10-CM

## 2012-11-09 DIAGNOSIS — E1165 Type 2 diabetes mellitus with hyperglycemia: Secondary | ICD-10-CM | POA: Diagnosis present

## 2012-11-09 DIAGNOSIS — Z7902 Long term (current) use of antithrombotics/antiplatelets: Secondary | ICD-10-CM

## 2012-11-09 DIAGNOSIS — I252 Old myocardial infarction: Secondary | ICD-10-CM

## 2012-11-09 DIAGNOSIS — Z7982 Long term (current) use of aspirin: Secondary | ICD-10-CM

## 2012-11-09 DIAGNOSIS — E785 Hyperlipidemia, unspecified: Secondary | ICD-10-CM | POA: Diagnosis present

## 2012-11-09 DIAGNOSIS — Z823 Family history of stroke: Secondary | ICD-10-CM

## 2012-11-09 HISTORY — DX: Chronic kidney disease, stage 4 (severe): N18.4

## 2012-11-09 LAB — CBC WITH DIFFERENTIAL/PLATELET
Eosinophils Relative: 4 % (ref 0–5)
HCT: 35.7 % — ABNORMAL LOW (ref 39.0–52.0)
Lymphocytes Relative: 19 % (ref 12–46)
Lymphs Abs: 1.4 10*3/uL (ref 0.7–4.0)
MCV: 81.1 fL (ref 78.0–100.0)
Monocytes Absolute: 0.5 10*3/uL (ref 0.1–1.0)
RBC: 4.4 MIL/uL (ref 4.22–5.81)
RDW: 14.8 % (ref 11.5–15.5)
WBC: 7.8 10*3/uL (ref 4.0–10.5)

## 2012-11-09 LAB — BASIC METABOLIC PANEL WITH GFR
BUN: 31 mg/dL — ABNORMAL HIGH (ref 6–23)
CO2: 21 meq/L (ref 19–32)
Calcium: 9.3 mg/dL (ref 8.4–10.5)
Chloride: 103 meq/L (ref 96–112)
Creatinine, Ser: 2.02 mg/dL — ABNORMAL HIGH (ref 0.50–1.35)
GFR calc Af Amer: 35 mL/min — ABNORMAL LOW
GFR calc non Af Amer: 31 mL/min — ABNORMAL LOW
Glucose, Bld: 161 mg/dL — ABNORMAL HIGH (ref 70–99)
Potassium: 3.2 meq/L — ABNORMAL LOW (ref 3.5–5.1)
Sodium: 137 meq/L (ref 135–145)

## 2012-11-09 LAB — GLUCOSE, CAPILLARY

## 2012-11-09 LAB — TROPONIN I: Troponin I: 1.08 ng/mL

## 2012-11-09 LAB — PRO B NATRIURETIC PEPTIDE: Pro B Natriuretic peptide (BNP): 29103 pg/mL — ABNORMAL HIGH (ref 0–450)

## 2012-11-09 MED ORDER — ISOSORBIDE MONONITRATE ER 60 MG PO TB24
90.0000 mg | ORAL_TABLET | Freq: Every day | ORAL | Status: DC
  Administered 2012-11-09 – 2012-11-11 (×3): 90 mg via ORAL
  Filled 2012-11-09 (×3): qty 1

## 2012-11-09 MED ORDER — HYDRALAZINE HCL 20 MG/ML IJ SOLN
20.0000 mg | Freq: Once | INTRAMUSCULAR | Status: AC
  Administered 2012-11-09: 20 mg via INTRAVENOUS
  Filled 2012-11-09: qty 1

## 2012-11-09 MED ORDER — INSULIN ASPART 100 UNIT/ML ~~LOC~~ SOLN
0.0000 [IU] | Freq: Three times a day (TID) | SUBCUTANEOUS | Status: DC
  Administered 2012-11-10: 12:00:00 5 [IU] via SUBCUTANEOUS
  Administered 2012-11-10: 06:00:00 2 [IU] via SUBCUTANEOUS
  Administered 2012-11-10 – 2012-11-11 (×2): 3 [IU] via SUBCUTANEOUS

## 2012-11-09 MED ORDER — ONDANSETRON HCL 4 MG/2ML IJ SOLN: 4.0000 mg | Freq: Four times a day (QID) | INTRAMUSCULAR | Status: DC | PRN

## 2012-11-09 MED ORDER — CARVEDILOL 12.5 MG PO TABS: 12.5000 mg | ORAL_TABLET | ORAL | Status: DC

## 2012-11-09 MED ORDER — ACETAMINOPHEN 325 MG PO TABS: 650.0000 mg | ORAL_TABLET | ORAL | Status: DC | PRN

## 2012-11-09 MED ORDER — SODIUM CHLORIDE 0.9 % IJ SOLN: 3.0000 mL | INTRAMUSCULAR | Status: DC | PRN

## 2012-11-09 MED ORDER — FUROSEMIDE 10 MG/ML IJ SOLN
40.0000 mg | Freq: Once | INTRAMUSCULAR | Status: AC
  Administered 2012-11-09: 40 mg via INTRAVENOUS
  Filled 2012-11-09: qty 4

## 2012-11-09 MED ORDER — SODIUM CHLORIDE 0.9 % IJ SOLN
3.0000 mL | Freq: Two times a day (BID) | INTRAMUSCULAR | Status: DC
  Administered 2012-11-09 – 2012-11-11 (×4): 3 mL via INTRAVENOUS

## 2012-11-09 MED ORDER — POTASSIUM CHLORIDE CRYS ER 20 MEQ PO TBCR
20.0000 meq | EXTENDED_RELEASE_TABLET | Freq: Two times a day (BID) | ORAL | Status: DC
  Administered 2012-11-09 – 2012-11-11 (×4): 20 meq via ORAL
  Filled 2012-11-09 (×7): qty 1

## 2012-11-09 MED ORDER — INSULIN GLARGINE 100 UNIT/ML ~~LOC~~ SOLN
20.0000 [IU] | Freq: Every day | SUBCUTANEOUS | Status: DC
  Administered 2012-11-09 – 2012-11-10 (×2): 20 [IU] via SUBCUTANEOUS
  Filled 2012-11-09 (×4): qty 0.2

## 2012-11-09 MED ORDER — AMLODIPINE BESYLATE 5 MG PO TABS
5.0000 mg | ORAL_TABLET | Freq: Once | ORAL | Status: AC
  Administered 2012-11-09: 5 mg via ORAL
  Filled 2012-11-09: qty 1

## 2012-11-09 MED ORDER — CARVEDILOL 12.5 MG PO TABS
12.5000 mg | ORAL_TABLET | Freq: Two times a day (BID) | ORAL | Status: DC
  Administered 2012-11-09 – 2012-11-11 (×4): 12.5 mg via ORAL
  Filled 2012-11-09 (×6): qty 1

## 2012-11-09 MED ORDER — FUROSEMIDE 10 MG/ML IJ SOLN
80.0000 mg | Freq: Once | INTRAMUSCULAR | Status: AC
  Administered 2012-11-09: 80 mg via INTRAVENOUS
  Filled 2012-11-09: qty 8

## 2012-11-09 MED ORDER — CLOPIDOGREL BISULFATE 75 MG PO TABS
75.0000 mg | ORAL_TABLET | Freq: Every day | ORAL | Status: DC
  Administered 2012-11-10 – 2012-11-11 (×2): 75 mg via ORAL
  Filled 2012-11-09 (×3): qty 1

## 2012-11-09 MED ORDER — POTASSIUM CHLORIDE CRYS ER 20 MEQ PO TBCR
40.0000 meq | EXTENDED_RELEASE_TABLET | Freq: Once | ORAL | Status: AC
  Administered 2012-11-09: 23:00:00 40 meq via ORAL
  Filled 2012-11-09: qty 2

## 2012-11-09 MED ORDER — SODIUM CHLORIDE 0.9 % IV SOLN: 250.0000 mL | INTRAVENOUS | Status: DC | PRN

## 2012-11-09 MED ORDER — ATORVASTATIN CALCIUM 80 MG PO TABS
80.0000 mg | ORAL_TABLET | Freq: Every day | ORAL | Status: DC
  Administered 2012-11-09 – 2012-11-10 (×2): 80 mg via ORAL
  Filled 2012-11-09 (×3): qty 1

## 2012-11-09 MED ORDER — HYDRALAZINE HCL 20 MG/ML IJ SOLN
10.0000 mg | Freq: Once | INTRAMUSCULAR | Status: AC
  Administered 2012-11-09: 10 mg via INTRAVENOUS
  Filled 2012-11-09: qty 1

## 2012-11-09 MED ORDER — ALBUTEROL SULFATE (5 MG/ML) 0.5% IN NEBU
2.5000 mg | INHALATION_SOLUTION | Freq: Once | RESPIRATORY_TRACT | Status: AC
  Administered 2012-11-09: 2.5 mg via RESPIRATORY_TRACT
  Filled 2012-11-09: qty 0.5

## 2012-11-09 MED ORDER — HYDRALAZINE HCL 50 MG PO TABS
50.0000 mg | ORAL_TABLET | Freq: Three times a day (TID) | ORAL | Status: DC
  Administered 2012-11-09 – 2012-11-11 (×5): 50 mg via ORAL
  Filled 2012-11-09 (×7): qty 1

## 2012-11-09 MED ORDER — INSULIN ASPART 100 UNIT/ML ~~LOC~~ SOLN
0.0000 [IU] | Freq: Every day | SUBCUTANEOUS | Status: DC
  Administered 2012-11-09 – 2012-11-10 (×2): 2 [IU] via SUBCUTANEOUS

## 2012-11-09 MED ORDER — DOXAZOSIN MESYLATE 8 MG PO TABS
8.0000 mg | ORAL_TABLET | Freq: Every day | ORAL | Status: DC
  Administered 2012-11-09 – 2012-11-10 (×2): 8 mg via ORAL
  Filled 2012-11-09 (×3): qty 1

## 2012-11-09 MED ORDER — AMLODIPINE BESYLATE 5 MG PO TABS
5.0000 mg | ORAL_TABLET | Freq: Every day | ORAL | Status: DC
  Filled 2012-11-09: qty 1

## 2012-11-09 NOTE — H&P (Addendum)
Admission History and Physical   Patient ID: Joshua Vincent, MRN: [id number], DOB: 1934/07/17 78 y.o. Date of Encounter: 11/09/2012, 7:06 PM  Primary Physician: Lupe Carney, MD Primary Cardiologist: Dr. Shirlee Latch  Chief Complaint:  SOB  History of Present Illness: Joshua Vincent is a 78 y.o. male with a history of ischemic cardiomyopathy s/p CABG 2011.  Several months ago was on Milrinone with an EF of 15% but LV function has since recovered.  Several recent NSTEMIs due to acute on chronic CHF and/or Hypertensive emergency.  Seen by Dr. Shirlee Latch on 9/25.  He started feeling poorly a few days ago.  More lethargic, increased orthopnea and DOE.  Weight is up from 206-212. Today he felt particuarly bad, not eating, did not take his meds this morning.  Had been adherent to his medication regimen until today.  His granddaughter who he lives with had a recent URI which he is concerned he caught.  However, no productive cough, congestion, fevers, or chills.  Denies CP, worsening of his edema, or palpitations.   He does note that he has had some canned chicken noodle soup in the setting of concern for URI.    Upon arrival to ED he was quite hypertensive to 230/110.  He received hydralazine 10mg  IV, then 20mg , Lasix IV 40mg , and his home coreg and norvasc.  BP much improved upon me seeing him at 140/70.     Past Medical History  Diagnosis Date  . HTN (hypertension)   . HLD (hyperlipidemia)   . Benign prostatic hypertrophy     Hx of urinary retention as well.  . Diabetes mellitus, type 2   . Osteoarthrosis, unspecified whether generalized or localized, shoulder region   . History of tobacco abuse     quit 1992  . CAD (coronary artery disease)     a. 4v CABG (1/11): LIMA LAD, SVG-D, SVG-OM, SVG-PLV. b. NSTEMI 08/2011 cath  revealed severe native CAD, patent SVG to OM & LIMA to LAD, occluded SVG to RCA and SVG to Diagonal, EF 40%, no culprit lesion for PCI, med therapy - f/u EF 55-60%. c. NSTEMI 06/2012 -  stable anatomy by cath, med rx recommended.  . CVA (cerebral infarction)     peri-catheterization in 12/10.  Patient initially developed double vision and MRI showed right dorsal mid-brain infarct.  Carotid dopplers with no significant disease.  Double vision has resolved.    . Systolic and diastolic CHF, acute on chronic     a. EF 40% by cath 08/2011; EF 55-60% by echo 09/2011. b. EF drop to 15% by echo 06/2012.  Marland Kitchen Leg wound, left 08/2011  . Obesity   . Mitral regurgitation     a. Mild-mod by echo 07/01/12.  Marland Kitchen PVC's (premature ventricular contractions)     3 week  event monitor 8/11: no signifiant arrhythmias, occ PVCs  . Chronic kidney disease     hx of kidney stones  . CKD (chronic kidney disease)     a. Baseline Cr 1.4. b. AKI 06/2012 with Cr at discharge around 2.2.  . Bladder cancer     s/p resection 7/11  . Iron deficiency anemia      Past Surgical History  Procedure Laterality Date  . Appendectomy    . Transurethral resection of prostate    . Coronary artery bypass graft  2011  . Retinal detachment surgery    . Eye surgery  March 20 2012      Current Facility-Administered Medications  Medication Dose Route  Frequency Provider Last Rate Last Dose  . carvedilol (COREG) tablet 12.5 mg  12.5 mg Oral BID WC Roney Marion, MD   12.5 mg at 11/09/12 1702   Current Outpatient Prescriptions  Medication Sig Dispense Refill  . amLODipine (NORVASC) 5 MG tablet Take 1 tablet (5 mg total) by mouth daily.  30 tablet  6  . aspirin EC 81 MG tablet Take 81 mg by mouth daily.      Marland Kitchen atorvastatin (LIPITOR) 80 MG tablet Take 1 tablet (80 mg total) by mouth daily.  30 tablet  6  . carvedilol (COREG) 12.5 MG tablet Take 12.5 mg by mouth as directed. 1 and 1/2 tablets twice a day      . clopidogrel (PLAVIX) 75 MG tablet Take 1 tablet (75 mg total) by mouth daily with breakfast.  30 tablet  3  . doxazosin (CARDURA) 8 MG tablet Take 1 tablet (8 mg total) by mouth at bedtime.  90 tablet  3  .  furosemide (LASIX) 40 MG tablet Take 80 mg in the am and 40 mg in the pm  270 tablet  1  . hydrALAZINE (APRESOLINE) 50 MG tablet Take 1 tablet (50 mg total) by mouth 3 (three) times daily.  270 tablet  1  . Insulin Glargine (LANTUS SOLOSTAR [place]) Inject 25 Units into the skin daily.       . isosorbide mononitrate (IMDUR) 60 MG 24 hr tablet Take 1.5 tablets (90 mg total) by mouth daily.  45 tablet  3  . potassium chloride SA (K-DUR,KLOR-CON) 20 MEQ tablet Take 40 meq (2 tablets) in the am and 20 meq (1 tablet) in the pm  90 tablet  6  . nitroGLYCERIN (NITROSTAT) 0.4 MG SL tablet Place 0.4 mg under the tongue every 5 (five) minutes as needed for chest pain.          Allergies: Allergies  Allergen Reactions  . Niacin Itching     Social History:  The patient  reports that he quit smoking about 22 years ago. His smoking use included Cigarettes. He has a 30 pack-year smoking history. He does not have any smokeless tobacco history on file. He reports that he drinks alcohol. He reports that he does not use illicit drugs.   Family History:  The patient's family history includes Aneurysm in his brother and father; Heart disease in his sister; Osteoarthritis in his sister.   ROS:   Review of Systems - Review of Systems  Constitutional: Negative for fever, chills and weight loss.  HENT: Negative for congestion, hearing loss, sore throat and tinnitus.   Eyes: Negative for blurred vision, double vision and photophobia.  Respiratory: Positive for shortness of breath. Negative for cough, hemoptysis and sputum production.   Cardiovascular: Positive for orthopnea and leg swelling. Negative for chest pain, palpitations and PND.  Gastrointestinal: Negative for heartburn, nausea, vomiting, abdominal pain, diarrhea and constipation.  Genitourinary: Negative for dysuria, urgency and frequency.  Musculoskeletal: Negative for back pain, myalgias and neck pain.  Skin: Negative for itching and rash.  Neurological:  Negative for dizziness, tingling, tremors and headaches.  Endo/Heme/Allergies: Negative for environmental allergies. Does not bruise/bleed easily.  Psychiatric/Behavioral: Negative for depression, suicidal ideas and substance abuse.       Vital Signs: Blood pressure 143/58, pulse 80, temperature 98.6 F (37 C), temperature source Oral, resp. rate 24, SpO2 95.00%.  PHYSICAL EXAM: General:  Well nourished, well developed, in no acute distress HEENT: normal Lymph: no adenopathy Neck: Elevated to  12cm Endocrine:  No thryomegaly Vascular: No carotid bruits; FA pulses 2+ bilaterally without bruits Cardiac:  normal S1, S2; + S3. RRR; no murmur Lungs:  Bibasilar rales.  Otherwise clear. Abd: soft, nontender, no hepatomegaly Ext: 1-2+ LE edema bilaterally. Musculoskeletal:  No deformities, BUE and BLE strength normal and equal Skin: warm and dry Neuro:  CNs 2-12 intact, no focal abnormalities noted Psych:  Normal affect   EKG:   Sinus with RBBB (old).  Freq PVCs with RBBB morphology.  Labs:   Lab Results  Component Value Date   WBC 7.8 11/09/2012   HGB 12.7* 11/09/2012   HCT 35.7* 11/09/2012   MCV 81.1 11/09/2012   PLT 223 11/09/2012    Recent Labs Lab 11/09/12 1624  NA 137  K 3.2*  CL 103  CO2 21  BUN 31*  CREATININE 2.02*  CALCIUM 9.3  GLUCOSE 161*    Recent Labs  11/09/12 1624  TROPONINI 1.08*   Lab Results  Component Value Date   CHOL 141 10/22/2012   HDL 31.00* 10/22/2012   LDLCALC 86 10/22/2012   TRIG 118.0 10/22/2012   No results found for this basename: DDIMER   BNP    Component Value Date/Time   PROBNP 29103.0* 11/09/2012 1624     Radiology/Studies:  Dg Chest 2 View  11/09/2012   CLINICAL DATA:  Shortness of breath  EXAM: CHEST  2 VIEW  COMPARISON:  07/07/2012  FINDINGS: Cardiac shadow is stable. Postsurgical changes are noted. The lungs are well aerated with some mild bibasilar atelectatic changes and small effusions.  IMPRESSION: Mild bibasilar  atelectasis and small effusions.   Electronically Signed   By: Alcide Clever M.D.   On: 11/09/2012 17:10     ASSESSMENT AND PLAN:   1. Acute on Chronic CHF - Patient with known ischemic cardiomyopathy, with recent recovery of LVEF.  History and exam are consistent with volume overload, secondary to some increased dietary salt intake and more acutely hypertensive urgency today. - Will continue home meds, give additional dose of Lasix IV (80mg ) this evening - replete potassium - Echo in the morning - ACE/ARB initiation?  2. NSTEMI with Trop of 1 in setting of CKD. - previous CHF exacerbations/hypertensive events with elevated troponin - No chest pain or other symptoms/findings concerning for primary coronary event. - Check CK-MB, repeat trop in AM.  3.  Frequent ventricular ectopy on telemetry - Only single beats, no couplets noted - replete potassium - continue BB - monitor overnight on telemetry.   4. CAD s/p CABG - continue ASA/Plavix  5. CKD stage III Stable.  Continue home meds.     Signed,  Yaakov Guthrie, MD 11/09/2012, 7:06 PM

## 2012-11-09 NOTE — ED Notes (Signed)
Pt states he has been having some increased generalized weakness, sob on exertion, and chest pressure that started 3 days ago. Pt states he has been around a family member with a URI, and has a cough. Pt has not taken any of his medications since yesterday due to not feeling well. Pt is hypertensive but has no associated sx.

## 2012-11-09 NOTE — ED Notes (Signed)
Transporting patient to new room assignment. 

## 2012-11-09 NOTE — ED Notes (Signed)
Pt denies any chest pressure at this time, states he only feels sob when he exerts himself. Pt in no distress at this time.

## 2012-11-09 NOTE — ED Provider Notes (Signed)
CSN: [id number]     Arrival date & time 11/09/12  1524 History   First MD Initiated Contact with Patient 11/09/12 1531     Chief Complaint  Patient presents with  . Shortness of Breath   HPI  Joshua Vincent presents for shortness of breath. He thinks that he may have caught a cold from his granddaughter who was here over the weekend. He is been more short of breath over the last 48 hours. He did not feel well and that she did not take his antihypertensives this morning. He states that he did take them yesterday. He normally sleeps propped up on a few pillows. This is unchanged. However he is awakening over the last few days more short of breath. His dry weight at discharge in office appointments as 206. He is to 12 today. He does not notice any change in the amount of dependent edema he has. He has not had pain. He follows with [facility] cardiology and Dr. Jearld Pies. I've read the recent notes.  He had a low ejection fraction that did recover. He actually at some point and required IV milranone.  No pain.  Shortness of breath. No fever. No GI symptoms. No vomiting diarrhea.  Past Medical History  Diagnosis Date  . HTN (hypertension)   . HLD (hyperlipidemia)   . Benign prostatic hypertrophy     Hx of urinary retention as well.  . Diabetes mellitus, type 2   . Osteoarthrosis, unspecified whether generalized or localized, shoulder region   . History of tobacco abuse     quit 1992  . CAD (coronary artery disease)     a. 4v CABG (1/11): LIMA LAD, SVG-D, SVG-OM, SVG-PLV. b. NSTEMI 08/2011 cath  revealed severe native CAD, patent SVG to OM & LIMA to LAD, occluded SVG to RCA and SVG to Diagonal, EF 40%, no culprit lesion for PCI, med therapy - f/u EF 55-60%. c. NSTEMI 06/2012 - stable anatomy by cath, med rx recommended.  . CVA (cerebral infarction)     peri-catheterization in 12/10.  Patient initially developed double vision and MRI showed right dorsal mid-brain infarct.  Carotid dopplers with no  significant disease.  Double vision has resolved.    . Systolic and diastolic CHF, acute on chronic     a. EF 40% by cath 08/2011; EF 55-60% by echo 09/2011. b. EF drop to 15% by echo 06/2012.  Marland Kitchen Leg wound, left 08/2011  . Obesity   . Mitral regurgitation     a. Mild-mod by echo 07/01/12.  Marland Kitchen PVC's (premature ventricular contractions)     3 week  event monitor 8/11: no signifiant arrhythmias, occ PVCs  . Chronic kidney disease     hx of kidney stones  . CKD (chronic kidney disease)     a. Baseline Cr 1.4. b. AKI 06/2012 with Cr at discharge around 2.2.  . Bladder cancer     s/p resection 7/11  . Iron deficiency anemia    Past Surgical History  Procedure Laterality Date  . Appendectomy    . Transurethral resection of prostate    . Coronary artery bypass graft  2011  . Retinal detachment surgery    . Eye surgery  March 20 2012   Family History  Problem Relation Age of Onset  . Aneurysm Father     Father died at age 38 from an embolic cerebrovascular accident from a deep vein thrombosis in his lower extremity.  . Osteoarthritis Sister   . Heart disease Sister   .  Aneurysm Brother    History  Substance Use Topics  . Smoking status: Former Smoker -- 2.00 packs/day for 15 years    Types: Cigarettes    Quit date: 04/03/1990  . Smokeless tobacco: Not on file     Comment: patient states they just burned more than actually smoked  . Alcohol Use: Yes     Comment: occasionally    Review of Systems  Constitutional: Negative for fever, chills, diaphoresis, appetite change and fatigue.  HENT: Negative for mouth sores, sore throat and trouble swallowing.   Eyes: Negative for visual disturbance.  Respiratory: Positive for shortness of breath. Negative for cough, chest tightness and wheezing.   Cardiovascular: Negative for chest pain.  Gastrointestinal: Negative for nausea, vomiting, abdominal pain, diarrhea and abdominal distention.  Endocrine: Negative for polydipsia, polyphagia and  polyuria.  Genitourinary: Negative for dysuria, frequency and hematuria.  Musculoskeletal: Negative for gait problem.  Skin: Negative for color change, pallor and rash.  Neurological: Negative for dizziness, syncope, light-headedness and headaches.  Hematological: Does not bruise/bleed easily.  Psychiatric/Behavioral: Negative for behavioral problems and confusion.    Allergies  Niacin  Home Medications   Current Outpatient Rx  Name  Route  Sig  Dispense  Refill  . amLODipine (NORVASC) 5 MG tablet   Oral   Take 1 tablet (5 mg total) by mouth daily.   30 tablet   6   . aspirin EC 81 MG tablet   Oral   Take 81 mg by mouth daily.         Marland Kitchen atorvastatin (LIPITOR) 80 MG tablet   Oral   Take 1 tablet (80 mg total) by mouth daily.   30 tablet   6     Please file dose change patient has medication cur ...   . carvedilol (COREG) 12.5 MG tablet   Oral   Take 12.5 mg by mouth as directed. 1 and 1/2 tablets twice a day         . clopidogrel (PLAVIX) 75 MG tablet   Oral   Take 1 tablet (75 mg total) by mouth daily with breakfast.   30 tablet   3   . doxazosin (CARDURA) 8 MG tablet   Oral   Take 1 tablet (8 mg total) by mouth at bedtime.   90 tablet   3   . furosemide (LASIX) 40 MG tablet      Take 80 mg in the am and 40 mg in the pm   270 tablet   1   . hydrALAZINE (APRESOLINE) 50 MG tablet   Oral   Take 1 tablet (50 mg total) by mouth 3 (three) times daily.   270 tablet   1   . Insulin Glargine (LANTUS SOLOSTAR [place])   Subcutaneous   Inject 25 Units into the skin daily.          . isosorbide mononitrate (IMDUR) 60 MG 24 hr tablet   Oral   Take 1.5 tablets (90 mg total) by mouth daily.   45 tablet   3   . potassium chloride SA (K-DUR,KLOR-CON) 20 MEQ tablet      Take 40 meq (2 tablets) in the am and 20 meq (1 tablet) in the pm   90 tablet   6     Please file dose change patient has medication at  ...   . nitroGLYCERIN (NITROSTAT) 0.4 MG SL  tablet   Sublingual   Place 0.4 mg under the tongue every 5 (five)  minutes as needed for chest pain.          BP 164/81  Pulse 86  Temp(Src) 98.6 F (37 C) (Oral)  Resp 25  SpO2 95% Physical Exam  Constitutional: He is oriented to person, place, and time. He appears well-developed and well-nourished. No distress.  HENT:  Head: Normocephalic.  Eyes: Conjunctivae are normal. Pupils are equal, round, and reactive to light. No scleral icterus.  Neck: Normal range of motion. Neck supple. No thyromegaly present.  Cardiovascular: Normal rate and regular rhythm.  Exam reveals no gallop and no friction rub.   No murmur heard. Pulmonary/Chest: Effort normal. No respiratory distress. He has no wheezes. He has rales in the right lower field and the left lower field.  Abdominal: Soft. Bowel sounds are normal. He exhibits no distension. There is no tenderness. There is no rebound.  Musculoskeletal: Normal range of motion.  Neurological: He is alert and oriented to person, place, and time.  Skin: Skin is warm and dry. No rash noted.  Bilateral lower extremity edema. Left greater than right. He states his left leg is more swollen than his right since an injury year ago  Psychiatric: He has a normal mood and affect. His behavior is normal.    ED Course  Procedures (including critical care time) Labs Review Labs Reviewed  CBC WITH DIFFERENTIAL - Abnormal; Notable for the following:    Hemoglobin 12.7 (*)    HCT 35.7 (*)    All other components within normal limits  BASIC METABOLIC PANEL - Abnormal; Notable for the following:    Potassium 3.2 (*)    Glucose, Bld 161 (*)    BUN 31 (*)    Creatinine, Ser 2.02 (*)    GFR calc non Af Amer 31 (*)    GFR calc Af Amer 35 (*)    All other components within normal limits  PRO B NATRIURETIC PEPTIDE - Abnormal; Notable for the following:    Pro B Natriuretic peptide (BNP) 29103.0 (*)    All other components within normal limits  TROPONIN I -  Abnormal; Notable for the following:    Troponin I 1.08 (*)    All other components within normal limits   Imaging Review Dg Chest 2 View  11/09/2012   CLINICAL DATA:  Shortness of breath  EXAM: CHEST  2 VIEW  COMPARISON:  07/07/2012  FINDINGS: Cardiac shadow is stable. Postsurgical changes are noted. The lungs are well aerated with some mild bibasilar atelectatic changes and small effusions.  IMPRESSION: Mild bibasilar atelectasis and small effusions.   Electronically Signed   By: Alcide Clever M.D.   On: 11/09/2012 17:10    EKG Interpretation     Ventricular Rate:  101 PR Interval:  160 QRS Duration: 150 QT Interval:  435 QTC Calculation: 564 R Axis:   50 Text Interpretation:  Sinus tachycardia Ventricular trigeminy Right bundle branch block Anterior infarct, age indeterminate            MDM   1. CHF (congestive heart failure)     Discussion usually not feeling badly. Blood pressure is been a little refractory. He had IV hydralazine and by mouth morning meds. Is given an additional 20 of IV hydralazine. He remains high at 180/103. His initial pressure was 220/130. Shows trace bilateral effusions but no frank CHF or pulmonary edema. BNP is over 29,000. Discussed case with of our cardiologist on call. He is here complaining evaluation. Patient was given IV Lasix 40 mg was  diuresed 500 cc. Clinically he is improved. Her torso troponin returns at 1.09. His renal function is baseline at 2.02     Roney Marion, MD 11/09/12 [phone]

## 2012-11-09 NOTE — ED Notes (Signed)
Cardiology at bedside.

## 2012-11-10 DIAGNOSIS — I509 Heart failure, unspecified: Secondary | ICD-10-CM

## 2012-11-10 DIAGNOSIS — I059 Rheumatic mitral valve disease, unspecified: Secondary | ICD-10-CM

## 2012-11-10 DIAGNOSIS — I5033 Acute on chronic diastolic (congestive) heart failure: Secondary | ICD-10-CM

## 2012-11-10 HISTORY — PX: TRANSTHORACIC ECHOCARDIOGRAM: SHX275

## 2012-11-10 LAB — BASIC METABOLIC PANEL
BUN: 32 mg/dL — ABNORMAL HIGH (ref 6–23)
BUN: 32 mg/dL — ABNORMAL HIGH (ref 6–23)
CO2: 23 mEq/L (ref 19–32)
CO2: 23 mEq/L (ref 19–32)
Calcium: 8.6 mg/dL (ref 8.4–10.5)
Creatinine, Ser: 2.38 mg/dL — ABNORMAL HIGH (ref 0.50–1.35)
Creatinine, Ser: 2.38 mg/dL — ABNORMAL HIGH (ref 0.50–1.35)
GFR calc Af Amer: 29 mL/min — ABNORMAL LOW (ref >90)
GFR calc Af Amer: 29 mL/min — ABNORMAL LOW (ref >90)
GFR calc non Af Amer: 25 mL/min — ABNORMAL LOW (ref >90)
GFR calc non Af Amer: 25 mL/min — ABNORMAL LOW (ref >90)
Potassium: 3.7 mEq/L (ref 3.5–5.1)
Sodium: 141 mEq/L (ref 135–145)

## 2012-11-10 LAB — CK TOTAL AND CKMB (NOT AT ARMC)
CK, MB: 3.3 ng/mL (ref 0.3–4.0)
Relative Index: INVALID (ref 0.0–2.5)
Total CK: 84 U/L (ref 7–232)

## 2012-11-10 LAB — GLUCOSE, CAPILLARY
Glucose-Capillary: 139 mg/dL — ABNORMAL HIGH (ref 70–99)
Glucose-Capillary: 219 mg/dL — ABNORMAL HIGH (ref 70–99)

## 2012-11-10 LAB — MAGNESIUM: Magnesium: 1.9 mg/dL (ref 1.5–2.5)

## 2012-11-10 MED ORDER — HYDRALAZINE HCL 20 MG/ML IJ SOLN
10.0000 mg | Freq: Once | INTRAMUSCULAR | Status: AC
  Administered 2012-11-10: 10 mg via INTRAVENOUS
  Filled 2012-11-10: qty 1

## 2012-11-10 MED ORDER — POTASSIUM CHLORIDE CRYS ER 20 MEQ PO TBCR
40.0000 meq | EXTENDED_RELEASE_TABLET | Freq: Once | ORAL | Status: AC
  Administered 2012-11-10: 40 meq via ORAL

## 2012-11-10 MED ORDER — HEPARIN SODIUM (PORCINE) 5000 UNIT/ML IJ SOLN
5000.0000 [IU] | Freq: Three times a day (TID) | INTRAMUSCULAR | Status: DC
  Administered 2012-11-10 – 2012-11-11 (×4): 5000 [IU] via SUBCUTANEOUS
  Filled 2012-11-10 (×6): qty 1

## 2012-11-10 MED ORDER — AMLODIPINE BESYLATE 10 MG PO TABS
10.0000 mg | ORAL_TABLET | Freq: Every day | ORAL | Status: DC
  Administered 2012-11-10 – 2012-11-11 (×2): 10 mg via ORAL
  Filled 2012-11-10 (×2): qty 1

## 2012-11-10 MED ORDER — ASPIRIN 81 MG PO CHEW
81.0000 mg | CHEWABLE_TABLET | Freq: Every day | ORAL | Status: DC
  Administered 2012-11-10 – 2012-11-11 (×2): 81 mg via ORAL
  Filled 2012-11-10 (×2): qty 1

## 2012-11-10 MED ORDER — FUROSEMIDE 10 MG/ML IJ SOLN
80.0000 mg | Freq: Two times a day (BID) | INTRAMUSCULAR | Status: DC
  Administered 2012-11-10 (×2): 80 mg via INTRAVENOUS
  Filled 2012-11-10 (×4): qty 8

## 2012-11-10 NOTE — Progress Notes (Signed)
*  PRELIMINARY RESULTS* Echocardiogram 2D Echocardiogram has been performed.  Jeryl Columbia 11/10/2012, 3:09 PM

## 2012-11-10 NOTE — Progress Notes (Signed)
Patient ID: Joshua Vincent, male   DOB: 1934/06/26, 78 y.o.   MRN: [id number]    SUBJECTIVE: Feeling better this morning.  Denies dyspnea at rest.   . amLODipine  10 mg Oral Daily  . aspirin  81 mg Oral Daily  . atorvastatin  80 mg Oral q1800  . carvedilol  12.5 mg Oral BID WC  . clopidogrel  75 mg Oral Q breakfast  . doxazosin  8 mg Oral QHS  . furosemide  80 mg Intravenous BID  . hydrALAZINE  50 mg Oral TID  . insulin aspart  0-15 Units Subcutaneous TID WC  . insulin aspart  0-5 Units Subcutaneous QHS  . insulin glargine  20 Units Subcutaneous QHS  . isosorbide mononitrate  90 mg Oral Daily  . potassium chloride SA  20 mEq Oral BID  . sodium chloride  3 mL Intravenous Q12H     Filed Vitals:   11/09/12 2000 11/09/12 2130 11/10/12 0100 11/10/12 0559  BP: 159/77 163/88 132/58 140/76  Pulse: 82 92 88 87  Temp:  97.7 F (36.5 C) 98.2 F (36.8 C) 99.1 F (37.3 C)  TempSrc:  Oral Oral Oral  Resp: 21 19 18 17   Height:  6' (1.829 m)    Weight:  91.264 kg (201 lb 3.2 oz)  89.8 kg (197 lb 15.6 oz)  SpO2: 97% 98% 95% 94%    Intake/Output Summary (Last 24 hours) at 11/10/12 0829 Last data filed at 11/10/12 0603  Gross per 24 hour  Intake    243 ml  Output   1050 ml  Net   -807 ml    LABS: Basic Metabolic Panel:  Recent Labs  40/98/11 1624 11/10/12 0530  NA 137 140  K 3.2* 3.7  CL 103 108  CO2 21 23  GLUCOSE 161* 144*  BUN 31* 32*  CREATININE 2.02* 2.38*  CALCIUM 9.3 8.5  MG  --  1.9   Liver Function Tests: No results found for this basename: AST, ALT, ALKPHOS, BILITOT, PROT, ALBUMIN,  in the last 72 hours No results found for this basename: LIPASE, AMYLASE,  in the last 72 hours CBC:  Recent Labs  11/09/12 1624  WBC 7.8  NEUTROABS 5.5  HGB 12.7*  HCT 35.7*  MCV 81.1  PLT 223   Cardiac Enzymes:  Recent Labs  11/09/12 1624 11/10/12 0530  CKTOTAL 150 84  CKMB 5.0* 3.3  TROPONINI 1.08* 0.91*   BNP: No components found with this basename: POCBNP,   D-Dimer: No results found for this basename: DDIMER,  in the last 72 hours Hemoglobin A1C: No results found for this basename: HGBA1C,  in the last 72 hours Fasting Lipid Panel: No results found for this basename: CHOL, HDL, LDLCALC, TRIG, CHOLHDL, LDLDIRECT,  in the last 72 hours Thyroid Function Tests: No results found for this basename: TSH, T4TOTAL, FREET3, T3FREE, THYROIDAB,  in the last 72 hours Anemia Panel: No results found for this basename: VITAMINB12, FOLATE, FERRITIN, TIBC, IRON, RETICCTPCT,  in the last 72 hours  RADIOLOGY: Dg Chest 2 View  11/09/2012   CLINICAL DATA:  Shortness of breath  EXAM: CHEST  2 VIEW  COMPARISON:  07/07/2012  FINDINGS: Cardiac shadow is stable. Postsurgical changes are noted. The lungs are well aerated with some mild bibasilar atelectatic changes and small effusions.  IMPRESSION: Mild bibasilar atelectasis and small effusions.   Electronically Signed   By: Alcide Clever M.D.   On: 11/09/2012 17:10    PHYSICAL EXAM General: NAD  Neck: JVP 10-11 cm, no thyromegaly or thyroid nodule.  Lungs: Slight crackles at bases.  CV: Nondisplaced PMI.  Heart regular S1/S2, no S3/S4, no murmur.  1+ edema 1/3 up lower legs R>L.  No carotid bruit.  Normal pedal pulses.  Abdomen: Soft, nontender, no hepatosplenomegaly, no distention.  Neurologic: Alert and oriented x 3.  Psych: Normal affect. Extremities: No clubbing or cyanosis.   TELEMETRY: Reviewed telemetry pt in NSR  ASSESSMENT AND PLAN: 78 yo with history of CAD s/p CABG, HTN, diastolic CHF, and CKD presented with hypertensive urgency and acute on chronic diastolic CHF.  1. Acute on chronic diastolic CHF: Patient thinks he initially developed URI from grand-daughter, and dyspnea worsened considerably over the weekend.  He had some dietary indiscretion with a fair amount of soup.  On exam today, he is mild to moderately volume overloaded.  - Will repeat echo to see if EF remains relatively preserved (50-55% on  last echo but 15% prior to that).  - Lasix 80 mg IV bid today.  - Possibly home tomorrow if volume looks better.  2. CAD: Troponin mildly elevated, now trending down.  No chest pain.  He has had elevated troponin deemed to be due to demand ischemia with volume overload/hypertensive crisis on prior admissions (LHC when this last happened showed stable coronary anatomy).  Given lack of chest pain, I do not think he needs an ischemic workup this time.  Continue statin, ASA, Plavix.  3. CKD: Current creatinine is near his recent baseline, but creatinine has trended up over the last year.  Watch carefully with diuresis.  He is not on ACEI.  4. HTN: BP high at admission.  Says he has been taking his meds.  Continue Coreg, hydralazine/Imdur.  I will increase amlodipine to 10 mg daily.   Marca Ancona 11/10/2012 8:37 AM

## 2012-11-11 ENCOUNTER — Encounter (HOSPITAL_COMMUNITY): Payer: Self-pay | Admitting: Nurse Practitioner

## 2012-11-11 DIAGNOSIS — I255 Ischemic cardiomyopathy: Secondary | ICD-10-CM

## 2012-11-11 LAB — BASIC METABOLIC PANEL
BUN: 33 mg/dL — ABNORMAL HIGH (ref 6–23)
CO2: 25 mEq/L (ref 19–32)
Calcium: 8.5 mg/dL (ref 8.4–10.5)
Creatinine, Ser: 2.51 mg/dL — ABNORMAL HIGH (ref 0.50–1.35)
Glucose, Bld: 117 mg/dL — ABNORMAL HIGH (ref 70–99)
Sodium: 138 mEq/L (ref 135–145)

## 2012-11-11 LAB — GLUCOSE, CAPILLARY: Glucose-Capillary: 158 mg/dL — ABNORMAL HIGH (ref 70–99)

## 2012-11-11 MED ORDER — FUROSEMIDE 80 MG PO TABS
80.0000 mg | ORAL_TABLET | Freq: Every day | ORAL | Status: DC
  Filled 2012-11-11: qty 1

## 2012-11-11 MED ORDER — AMLODIPINE BESYLATE 10 MG PO TABS: 10.0000 mg | ORAL_TABLET | Freq: Every day | ORAL | Status: DC

## 2012-11-11 MED ORDER — FUROSEMIDE 40 MG PO TABS
40.0000 mg | ORAL_TABLET | Freq: Every evening | ORAL | Status: DC
  Filled 2012-11-11: qty 1

## 2012-11-11 MED ORDER — POTASSIUM CHLORIDE CRYS ER 20 MEQ PO TBCR: 20.0000 meq | EXTENDED_RELEASE_TABLET | Freq: Two times a day (BID) | ORAL | Status: DC

## 2012-11-11 NOTE — Discharge Summary (Signed)
Discharge Summary   Patient ID: Joshua Vincent,  MRN: [id number], DOB/AGE: 1934-02-19 78 y.o.  Admit date: 11/09/2012 Discharge date: 11/11/2012  Primary Care Provider: Lupe Carney Primary Cardiologist: Golden Circle, MD  Discharge Diagnoses Principal Problem:   Acute on chronic combined systolic and diastolic heart failure  **Net negative diuresis of 1.7 Liters with reduction in weight from 201 lbs to 197 lbs.  Active Problems:   Hypertensive crisis   Cardiomyopathy, ischemic   DM   Coronary atherosclerosis of native coronary artery   Elevated troponin   DIABETIC PERIPHERAL NEUROPATHY   DYSLIPIDEMIA  Allergies Allergies  Allergen Reactions  . Niacin Itching   Procedures  2D Echocardiogram 11.3.2014  Study Conclusions  - Left ventricle: The cavity size was normal. Wall thickness   was increased in a pattern of mild LVH. Systolic function   was mildly reduced. The estimated ejection fraction was in   the range of 45% to 50%. Diffuse hypokinesis. Features are   consistent with a pseudonormal left ventricular filling   pattern, with concomitant abnormal relaxation and   increased filling pressure (grade 2 diastolic   dysfunction). - Mitral valve: Mild regurgitation. - Left atrium: The atrium was moderately dilated. - Pulmonary arteries: Systolic pressure was mildly   increased. PA peak pressure: 42mm Hg (S). _____________   History of Present Illness  78 year old male with prior history of ischemic cardiomyopathy and coronary artery disease status post coronary artery bypass grafting. He was in his usual state of health until a few days prior to admission when he began to experience progressive lethargy, orthopnea, and dyspnea exertion associated with a 6 pound weight gain. On the day of admission, he had worsening symptoms and early satiety. He thought he was coming down with an upper respiratory infection and was eating canned chicken noodle soup. Because of  progressive symptoms, he presented to the [facility] on the evening of November 2 where he was markedly hypertensive with a blood pressure of 230/110. In the ED, he was treated with IV hydralazine and Lasix with blood pressure improvement. He was felt to be volume overloaded and admitted for further evaluation.  Hospital Course  Following admission, patient was placed on IV diuretics with good response and symptomatic improvement. For this admission, he diuresed a net negative of 1.7 L with a reduction in weight from 201 pounds on admission to 197 pounds at discharge. He has been switched back to oral Lasix therapy and has been counseled on the importance of sodium restriction, dietary compliance, daily weights, and symptom reporting. With diuresis, he did have mild creatinine elevation on top of his chronic kidney disease. For that reason he is not on ACE inhibitor or ARB therapy however is maintained on hydralazine and nitrates. He'll be discharged home today in good condition and has followup arranged in heart failure clinic next week.  Discharge Vitals Blood pressure 152/76, pulse 88, temperature 98 F (36.7 C), temperature source Oral, resp. rate 16, height 6' (1.829 m), weight 197 lb 12 oz (89.7 kg), SpO2 98.00%.  Filed Weights   11/10/12 0559 11/10/12 1720 11/11/12 0456  Weight: 197 lb 15.6 oz (89.8 kg) 232 lb (105.235 kg) 197 lb 12 oz (89.7 kg)    Labs  CBC  Recent Labs  11/09/12 1624  WBC 7.8  NEUTROABS 5.5  HGB 12.7*  HCT 35.7*  MCV 81.1  PLT 223   Basic Metabolic Panel  Recent Labs  11/10/12 0530 11/11/12 0522  NA 140  141 138  K 3.7  3.8 3.7  CL 108  109 105  CO2 23  23 25   GLUCOSE 144*  145* 117*  BUN 32*  32* 33*  CREATININE 2.38*  2.38* 2.51*  CALCIUM 8.5  8.6 8.5  MG 1.9  --    Cardiac Enzymes  Recent Labs  11/09/12 1624 11/10/12 0530  CKTOTAL 150 84  CKMB 5.0* 3.3  TROPONINI 1.08* 0.91*   Disposition  Pt is being discharged home today in  good condition.  Follow-up Plans & Appointments      Follow-up Information   Follow up with Marca Ancona, MD On 11/18/2012. (10:00)    Specialty:  Cardiology   Contact information:   [address]  [address] [place] [address] [phone]       Follow up with Lupe Carney, MD. (as scheduled.)    Specialty:  Family Medicine   Contact information:   [address] [place] [address] [phone]      Discharge Medications    Medication List         amLODipine 10 MG tablet  Commonly known as:  NORVASC  Take 1 tablet (10 mg total) by mouth daily.     aspirin EC 81 MG tablet  Take 81 mg by mouth daily.     atorvastatin 80 MG tablet  Commonly known as:  LIPITOR  Take 1 tablet (80 mg total) by mouth daily.     carvedilol 12.5 MG tablet  Commonly known as:  COREG  Take 12.5 mg by mouth as directed. 1 and 1/2 tablets twice a day     clopidogrel 75 MG tablet  Commonly known as:  PLAVIX  Take 1 tablet (75 mg total) by mouth daily with breakfast.     doxazosin 8 MG tablet  Commonly known as:  CARDURA  Take 1 tablet (8 mg total) by mouth at bedtime.     furosemide 40 MG tablet  Commonly known as:  LASIX  Take 80 mg in the am and 40 mg in the pm     hydrALAZINE 50 MG tablet  Commonly known as:  APRESOLINE  Take 1 tablet (50 mg total) by mouth 3 (three) times daily.     isosorbide mononitrate 60 MG 24 hr tablet  Commonly known as:  IMDUR  Take 1.5 tablets (90 mg total) by mouth daily.     LANTUS SOLOSTAR [place]  Inject 25 Units into the skin daily.     nitroGLYCERIN 0.4 MG SL tablet  Commonly known as:  NITROSTAT  Place 0.4 mg under the tongue every 5 (five) minutes as needed for chest pain.     potassium chloride SA 20 MEQ tablet  Commonly known as:  K-DUR,KLOR-CON  Take 1 tablet (20 mEq total) by mouth 2 (two) times daily.       Outstanding Labs/Studies  bmet @ f/u appt.  Duration of Discharge Encounter   Greater than 30  minutes including physician time.  Signed, Nicolasa Ducking NP 11/11/2012, 10:06 AM

## 2012-11-11 NOTE — Progress Notes (Signed)
Patient ID: Joshua MINJARES, male   DOB: 12-20-1934, 78 y.o.   MRN: [id number]    SUBJECTIVE: Feels back to normal.  Diuresed reasonably, creatinine higher.   Marland Kitchen amLODipine  10 mg Oral Daily  . aspirin  81 mg Oral Daily  . atorvastatin  80 mg Oral q1800  . carvedilol  12.5 mg Oral BID WC  . clopidogrel  75 mg Oral Q breakfast  . doxazosin  8 mg Oral QHS  . furosemide  80 mg Intravenous BID  . heparin  5,000 Units Subcutaneous Q8H  . hydrALAZINE  50 mg Oral TID  . insulin aspart  0-15 Units Subcutaneous TID WC  . insulin aspart  0-5 Units Subcutaneous QHS  . insulin glargine  20 Units Subcutaneous QHS  . isosorbide mononitrate  90 mg Oral Daily  . potassium chloride SA  20 mEq Oral BID  . sodium chloride  3 mL Intravenous Q12H     Filed Vitals:   11/10/12 1957 11/10/12 2217 11/10/12 2352 11/11/12 0456  BP: 178/90 177/93 142/70 146/82  Pulse: 83   84  Temp: 98.1 F (36.7 C)   98 F (36.7 C)  TempSrc: Oral   Oral  Resp: 20   20  Height:      Weight:    89.7 kg (197 lb 12 oz)  SpO2: 100%   97%    Intake/Output Summary (Last 24 hours) at 11/11/12 0746 Last data filed at 11/11/12 0437  Gross per 24 hour  Intake   1200 ml  Output   2060 ml  Net   -860 ml    LABS: Basic Metabolic Panel:  Recent Labs  13/08/65 0530 11/11/12 0522  NA 140  141 138  K 3.7  3.8 3.7  CL 108  109 105  CO2 23  23 25   GLUCOSE 144*  145* 117*  BUN 32*  32* 33*  CREATININE 2.38*  2.38* 2.51*  CALCIUM 8.5  8.6 8.5  MG 1.9  --    Liver Function Tests: No results found for this basename: AST, ALT, ALKPHOS, BILITOT, PROT, ALBUMIN,  in the last 72 hours No results found for this basename: LIPASE, AMYLASE,  in the last 72 hours CBC:  Recent Labs  11/09/12 1624  WBC 7.8  NEUTROABS 5.5  HGB 12.7*  HCT 35.7*  MCV 81.1  PLT 223   Cardiac Enzymes:  Recent Labs  11/09/12 1624 11/10/12 0530  CKTOTAL 150 84  CKMB 5.0* 3.3  TROPONINI 1.08* 0.91*   BNP: No components found  with this basename: POCBNP,  D-Dimer: No results found for this basename: DDIMER,  in the last 72 hours Hemoglobin A1C: No results found for this basename: HGBA1C,  in the last 72 hours Fasting Lipid Panel: No results found for this basename: CHOL, HDL, LDLCALC, TRIG, CHOLHDL, LDLDIRECT,  in the last 72 hours Thyroid Function Tests: No results found for this basename: TSH, T4TOTAL, FREET3, T3FREE, THYROIDAB,  in the last 72 hours Anemia Panel: No results found for this basename: VITAMINB12, FOLATE, FERRITIN, TIBC, IRON, RETICCTPCT,  in the last 72 hours  RADIOLOGY: Dg Chest 2 View  11/09/2012   CLINICAL DATA:  Shortness of breath  EXAM: CHEST  2 VIEW  COMPARISON:  07/07/2012  FINDINGS: Cardiac shadow is stable. Postsurgical changes are noted. The lungs are well aerated with some mild bibasilar atelectatic changes and small effusions.  IMPRESSION: Mild bibasilar atelectasis and small effusions.   Electronically Signed   By: Alcide Clever  M.D.   On: 11/09/2012 17:10    PHYSICAL EXAM General: NAD Neck: JVP 7-8 cm, no thyromegaly or thyroid nodule.  Lungs: Slight crackles at bases.  CV: Nondisplaced PMI.  Heart regular S1/S2, no S3/S4, no murmur.  No edema.  No carotid bruit.  Normal pedal pulses.  Abdomen: Soft, nontender, no hepatosplenomegaly, no distention.  Neurologic: Alert and oriented x 3.  Psych: Normal affect. Extremities: No clubbing or cyanosis.   TELEMETRY: Reviewed telemetry pt in NSR  ASSESSMENT AND PLAN: 78 yo with history of CAD s/p CABG, HTN, diastolic CHF, and CKD presented with hypertensive urgency and acute on chronic diastolic CHF.  1. Acute on chronic diastolic CHF: Patient thinks he initially developed URI from grand-daughter, and dyspnea worsened considerably over the weekend.  He had some dietary indiscretion with a fair amount of soup.  On exam today, he looks near his baseline.  It appears that he has lost about 4 lbs. Echo showed EF 45-50% which is near his most  recent prior.  - Transition to po Lasix and may go home today.  - It is going to be very important for him to be compliant with medications.  2. CAD: Troponin mildly elevated and trended down.  No chest pain.  He has had elevated troponin deemed to be due to demand ischemia with volume overload/hypertensive crisis on prior admissions (LHC when this last happened showed stable coronary anatomy).  Given lack of chest pain, I do not think he needs an ischemic workup this time.  Continue statin, ASA, Plavix.  3. CKD: Mild elevation of creatinine with diuresis.  He is not on ACEI.  4. HTN: BP high at admission.  Says he has been taking his meds.  Continue Coreg, hydralazine/Imdur.  Amlodipine increased to 10 mg daily. 5. Disposition: May go home today.  Meds: ASA 81, Plavix, amlodipine 10 daily, Coreg 12.5 bid, atorvastatin 80, Imdur 90 daily, Lasix 80 qam/40 qpm, KCl 20 bid, hydralazine 50 tid, doxazosin as prior to admission, diabetes meds as prior to admission.  He needs followup with me in CHF clinic on a day I am there in about a week with BMET at that time.  Needs emphasis on medication compliance.    Marca Ancona 11/11/2012 7:46 AM

## 2012-11-11 NOTE — Progress Notes (Signed)
1335 discharge instructions given to pt . Verbalized understanding

## 2012-11-13 DIAGNOSIS — E1129 Type 2 diabetes mellitus with other diabetic kidney complication: Secondary | ICD-10-CM

## 2012-11-13 DIAGNOSIS — N183 Chronic kidney disease, stage 3 (moderate): Secondary | ICD-10-CM

## 2012-11-13 DIAGNOSIS — I509 Heart failure, unspecified: Secondary | ICD-10-CM

## 2012-11-13 DIAGNOSIS — I5033 Acute on chronic diastolic (congestive) heart failure: Secondary | ICD-10-CM

## 2012-11-13 DIAGNOSIS — E1165 Type 2 diabetes mellitus with hyperglycemia: Secondary | ICD-10-CM

## 2012-11-18 ENCOUNTER — Ambulatory Visit (HOSPITAL_COMMUNITY)
Admission: RE | Admit: 2012-11-18 | Discharge: 2012-11-18 | Disposition: A | Discharge status: Home / Self Care | Payer: Medicare Other | Source: Ambulatory Visit | Attending: Internal Medicine | Admitting: Internal Medicine

## 2012-11-18 ENCOUNTER — Encounter (HOSPITAL_COMMUNITY): Payer: Self-pay

## 2012-11-18 VITALS — BP 112/58 | HR 66 | Wt 193.8 lb

## 2012-11-18 DIAGNOSIS — G4733 Obstructive sleep apnea (adult) (pediatric): Secondary | ICD-10-CM | POA: Insufficient documentation

## 2012-11-18 DIAGNOSIS — I509 Heart failure, unspecified: Secondary | ICD-10-CM | POA: Insufficient documentation

## 2012-11-18 DIAGNOSIS — E785 Hyperlipidemia, unspecified: Secondary | ICD-10-CM | POA: Insufficient documentation

## 2012-11-18 DIAGNOSIS — I5032 Chronic diastolic (congestive) heart failure: Secondary | ICD-10-CM | POA: Insufficient documentation

## 2012-11-18 DIAGNOSIS — Z8673 Personal history of transient ischemic attack (TIA), and cerebral infarction without residual deficits: Secondary | ICD-10-CM | POA: Insufficient documentation

## 2012-11-18 DIAGNOSIS — N189 Chronic kidney disease, unspecified: Secondary | ICD-10-CM | POA: Insufficient documentation

## 2012-11-18 DIAGNOSIS — I252 Old myocardial infarction: Secondary | ICD-10-CM | POA: Insufficient documentation

## 2012-11-18 DIAGNOSIS — I2510 Atherosclerotic heart disease of native coronary artery without angina pectoris: Secondary | ICD-10-CM | POA: Insufficient documentation

## 2012-11-18 DIAGNOSIS — E119 Type 2 diabetes mellitus without complications: Secondary | ICD-10-CM | POA: Insufficient documentation

## 2012-11-18 DIAGNOSIS — Z951 Presence of aortocoronary bypass graft: Secondary | ICD-10-CM | POA: Insufficient documentation

## 2012-11-18 DIAGNOSIS — I129 Hypertensive chronic kidney disease with stage 1 through stage 4 chronic kidney disease, or unspecified chronic kidney disease: Secondary | ICD-10-CM | POA: Insufficient documentation

## 2012-11-18 DIAGNOSIS — N400 Enlarged prostate without lower urinary tract symptoms: Secondary | ICD-10-CM | POA: Insufficient documentation

## 2012-11-18 DIAGNOSIS — I5022 Chronic systolic (congestive) heart failure: Secondary | ICD-10-CM

## 2012-11-18 DIAGNOSIS — M19019 Primary osteoarthritis, unspecified shoulder: Secondary | ICD-10-CM | POA: Insufficient documentation

## 2012-11-18 LAB — BASIC METABOLIC PANEL
Chloride: 101 mEq/L (ref 96–112)
Creatinine, Ser: 2.19 mg/dL — ABNORMAL HIGH (ref 0.50–1.35)
GFR calc Af Amer: 32 mL/min — ABNORMAL LOW (ref >90)
Potassium: 3.6 mEq/L (ref 3.5–5.1)

## 2012-11-18 NOTE — Progress Notes (Signed)
Patient ID: Joshua Vincent, male   DOB: [birth year], 78 y.o.   MRN: [id number] PCP: Dr. Clovis Riley  78 yo with history of diastolic CHF, CAD s/p NSTEMI in 12/10 accompanied by pulmonary edema and peri-catheterization CVA, now s/p CABG.  He was admitted in 8/13 with NSTEMI. LHC showed severe disease in the RCA and occlusion of the LAD and LCX.  SVG-D and SVG-RCA were occluded.  SVG-OM and LIMA-LAD were patent.  EF was 45% by LV-gram but echo done in 9/13 showed EF 55-60% .  He was admitted in 6/14 with NSTEMI (TnI to 12) and acute systolic CHF.  EF was 15% by echo (newly decreased).  He was begun on milrinone and IV Lasix with good diuresis.  LHC showed stable anatomy with patent LIMA-LAD and SVG-OM.  There was severe diffuse disease in small PDA and small PLV.  No intervention.  He was also admitted in 8/14 with transient aphasia.  MRI did not show acute infarct.  Suspected TIA secondary to small vessel disease.  Echo in 8/14 showed surprising EF recovery to 50-55%.   Post hospital visit: Discharged from the hospital 11/4 for A/C combined HF. Discharge weight 197 lbs. Doing well. Wears CPAP when he does not have nasal congestion. Can only walk about 50-100 ft before SOB and back pain - he feels main issue is back pain - which is normal for patient. Denies orthopnea, edema or CP. SBP at home 130/60s. Not weighing regularly at home. Waiting for [facility] to come out and bring telemonitoring equipment.   Labs (5/11): BNP 520, K 4.1, creatinine 1.0 Labs (8/11): BNP 120, HDL 34, LDL 148 Labs (9/11): K 4.7, creatinine 1.2 Labs (12/11): K 3.3 => 5.3, creatinine 0.9 => 1.3, BNP 918 => 199 Labs (2/12): K 3.9, creatinine 1.11 Labs (6/12): K 4.3, creatinine 1.0 Labs (8/13): LDL 142, HDL 40 (not taking statin) Labs (9/13): K 3.8, creatinine 1.46 => 1.6, BNP 385 Labs (12/13): K 4.1, creatinine 1.4, BNP 247, LDL 111, HDL 36 Labs (8/14): K 3.5, creatinine 2.0, HCT 33.2, LDL 183, HDL 32 Labs (9/14): K 3.4, creatinine 2.2,  cholesterol 148, HDL 35, LDL 99, AST 19, ALT 26 Labs (10/14): Cholesterol 141, TG 118, HDL 31, LDL 86 Labs (11/14): K+ 3.7, Cr 2.5  Allergies (verified):  1)  ! Niacin  Past Medical History: 1. HYPERTENSION (ICD-401.9) 2. DYSLIPIDEMIA (ICD-272.4) 3. BENIGN PROSTATIC HYPERTROPHY, HX OF (ICD-V13.8) 4. DIABETIC PERIPHERAL NEUROPATHY (ICD-250.60) 5. DM (ICD-250.00) 6. OSTEOARTHRITIS, SHOULDER, RIGHT (ICD-715.91) 7. COPD: PFTs (1/14) with mild obstruction.  8. CAD: NSTEMI (12/10).  LHC showed sequential 90% mid LAD stenoses and 90% stenosis at the mid to distal LAD, D1 totally occluded, OM1 subtotally occluded, 70% mid CFX, EF 50%.  CABG (1/11): LIMA-LAD, SVG-D, SVG-OM, SVG-PLV.  NSTEMI 8/13 with LHC showing total occlusion of the LAD and LCX, 80-90% mPDA, 90% PLV, occluded SVG-RCA, occluded SVG-D, patent SVG-OM and patent LIMA-LAD.  He was managed medically. NSTEMI (6/14) with LHC near-identical anatomy to 8/13 cath.  No intervention.  9.  CVA: peri-catheterization in 12/10.  Patient initially developed double vision and MRI showed right dorsal mid-brain infarct.  Carotid dopplers with no significant disease.  Double vision has resolved. TIA (8/14) with transient aphasia.  Carotid dopplers (8/14) without significant disease and MRI (8/14) with no acute infarct.   10.  Diastolic CHF: Pulmonary edema with NSTEMI.  Echo (12/10) showed EF 50-55% with mid to apical septal HK and mild mitral regurgitation.  Echo (3/11) when admitted  with CHF exacerbation: Moderate LVH, EF 60%, valves ok.  LV-gram with EF 45% in 8/13.  Echo (9/13) with EF 55-60%, mild MR.  Echo (6/14): EF 15%.  Echo (8/14): EF 50-55%, moderate LVH, mild biatrial enlargement. Echo (11/14); EF 45-50%, grade II DD 11. Bladder cancer s/p resection 7/11 12. 3 week event monitor (8/11): no significant arrhythmias, occasional PVCs 13. CKD 14. Nocturnal hypoxemia 15. OSA: on CPAP.    Family History: Father died at age 16 from an embolic  cerebrovascular accident from a deep vein thrombosis in his lower extremity.  His  brother died in his 17s from similar circumstances of an embolic CVA from a lower extremity deep vein thrombosis.  Mother died at age 20 without health problems prior to that.  The patient has a sister who died in her 44s from sequelae of a war injury.  He has a sister who is alive and is 36 years old with osteoarthritis.  He has 3 sons who are alive and healthy.   Social History: The patient is married.  He has been employed as a Electrical engineer.  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  He has had a distant smoking history, 50-pack years and quit 20 years ago.  He drinks alcohol occasionally.  He does not use drugs.   ROS:  All systems reviewed and negative except as per HPI.   Current Outpatient Prescriptions  Medication Sig Dispense Refill  . amLODipine (NORVASC) 10 MG tablet Take 1 tablet (10 mg total) by mouth daily.  30 tablet  6  . aspirin EC 81 MG tablet Take 81 mg by mouth daily.      Marland Kitchen atorvastatin (LIPITOR) 80 MG tablet Take 1 tablet (80 mg total) by mouth daily.  30 tablet  6  . carvedilol (COREG) 12.5 MG tablet Take 18.75 mg by mouth as directed. 1 and 1/2 tablets twice a day      . clopidogrel (PLAVIX) 75 MG tablet Take 1 tablet (75 mg total) by mouth daily with breakfast.  30 tablet  3  . doxazosin (CARDURA) 8 MG tablet Take 1 tablet (8 mg total) by mouth at bedtime.  90 tablet  3  . furosemide (LASIX) 40 MG tablet Take 80 mg in the am and 40 mg in the pm  270 tablet  1  . hydrALAZINE (APRESOLINE) 50 MG tablet Take 1 tablet (50 mg total) by mouth 3 (three) times daily.  270 tablet  1  . Insulin Glargine (LANTUS SOLOSTAR [place]) Inject 25 Units into the skin daily.       . isosorbide mononitrate (IMDUR) 60 MG 24 hr tablet Take 1.5 tablets (90 mg total) by mouth daily.  45 tablet  3  . nitroGLYCERIN (NITROSTAT) 0.4 MG SL tablet Place 0.4 mg under the tongue every 5 (five) minutes as needed for chest pain.       . potassium chloride SA (K-DUR,KLOR-CON) 20 MEQ tablet Take 1 tablet (20 mEq total) by mouth 2 (two) times daily.  90 tablet  6   No current facility-administered medications for this encounter.   Filed Vitals:   11/18/12 1009  BP: 112/58  Pulse: 66  Weight: 193 lb 12.8 oz (87.907 kg)  SpO2: 98%   General:  Elderly. Well developed, well nourished, in no acute distress; in wheelchair Neck:  Neck supple, JVP 7 cm. No masses, thyromegaly or abnormal cervical nodes. Lungs:  Clear bilaterally to auscultation and percussion. Heart:  Non-displaced PMI, chest non-tender; regular  rate and rhythm, S1, 2/6 systolic murmur RSB, rubs or gallops. Carotid upstroke normal, no bruit. R no edema, LLE trace edema Abdomen:  Bowel sounds positive; abdomen soft and non-tender without masses, organomegaly, or hernias noted. No hepatosplenomegaly. Extremities:  No clubbing or cyanosis. Injury to LLE with healed wound.  Neurologic:  Alert and oriented x 3. Psych:  Normal affect.  Assessment/Plan: 1. CAD: Status post CABG.  LHC 6/14 in setting of NSTEMI showed 2/4 patent grafts.  There was no change from 8/13 LHC (apparently a Type II NSTEMI).  He was managed medically. No exertional chest pain.  Continue ASA 81, Coreg, statin.   2. Chronic combined CHF: EF 45-50% low normal on 11/14 echo (improved from 15%).  NYHA class II-III symptoms. Volume status is good, weight down 4 lbs since discharge. Continue Lasix 80 qam, 40 qpm.   3. CKD: Cr from hospital d/c 2.5. Will recheck BMET today. Appears current baseline is 2.0-2.3  4. OSA: Using CPAP.   5. TIA: Recent admission with TIA (aphasia).  He is on ASA and Plavix.  Workup in hospital suggested small vessel disease. Carotid dopplers did not show significant disease.   F/U 6-8 weeks. Will set up for paramedicine program to follow.   Ulla Potash B 11/18/2012  Patient seen and examined with Ulla Potash, NP. We discussed all aspects of the encounter. I  agree with the assessment and plan as stated above.   Overall he is doing fairly well for HF standpoint. EF only mildly reduced. Volume status looks good. Seems limited mostly by back pain. Need to watch renal function closely. Reinforced need for daily weights and reviewed use of sliding scale diuretics. Given situation, I do think Paramedicine referral will be helpful.   Daniel Bensimhon,MD 3:29 PM

## 2012-11-18 NOTE — Patient Instructions (Addendum)
Doing great.  Will set up paramedicine program.  Follow up 6 weeks  Call any issues [phone].  No medication changes.  Do the following things EVERYDAY: 1) Weigh yourself in the morning before breakfast. Write it down and keep it in a log. 2) Take your medicines as prescribed 3) Eat low salt foods-Limit salt (sodium) to 2000 mg per day.  4) Stay as active as you can everyday 5) Limit all fluids for the day to less than 2 liters

## 2012-11-20 ENCOUNTER — Encounter (HOSPITAL_COMMUNITY): Payer: Self-pay

## 2012-12-03 ENCOUNTER — Telehealth (HOSPITAL_COMMUNITY): Payer: Self-pay | Admitting: Cardiology

## 2012-12-03 NOTE — Telephone Encounter (Signed)
Heather called during pts home visit with concerns for pts decreased HR Pt also reports some episodes of chest pain/pressure lasting a few seconds B/P @ time of visit 195/85 pulse 70 dropped to 36 (NO MEDS YET) While listening to HR she can hear 45-50 however with pulse ox she gets 28-30    Per VO Dr. Gala Romney DECREASE Coreg to 12.5mg  BID INCREASE   Hydralazine 75mg  TID If pts becomes symptomatic (dizziness,HA, chest pain, increased SOB) pt should report to ER for further evaluation

## 2012-12-10 ENCOUNTER — Telehealth: Payer: Self-pay | Admitting: Cardiology

## 2012-12-10 NOTE — Telephone Encounter (Signed)
Spoke w/Eliezer Khawaja, she states pt is d/c from [facility] b/c he is no longer homebound, pt is still followed by our paramedicine program

## 2012-12-10 NOTE — Telephone Encounter (Signed)
New problem   Need to talk to you concerning some issues of pt. Please call.

## 2012-12-29 ENCOUNTER — Ambulatory Visit (HOSPITAL_COMMUNITY): Payer: Medicare Other

## 2013-01-13 ENCOUNTER — Telehealth: Payer: Self-pay | Admitting: Cardiology

## 2013-01-13 NOTE — Telephone Encounter (Signed)
Walk In pt Form " Disability Pla-Card" dropped off gave to [facility]

## 2013-01-20 ENCOUNTER — Other Ambulatory Visit: Payer: Self-pay

## 2013-01-20 MED ORDER — FUROSEMIDE 40 MG PO TABS: ORAL_TABLET | ORAL | Status: DC

## 2013-01-26 ENCOUNTER — Other Ambulatory Visit: Payer: Self-pay

## 2013-01-26 DIAGNOSIS — E785 Hyperlipidemia, unspecified: Secondary | ICD-10-CM

## 2013-01-26 DIAGNOSIS — I5032 Chronic diastolic (congestive) heart failure: Secondary | ICD-10-CM

## 2013-01-26 DIAGNOSIS — I951 Orthostatic hypotension: Secondary | ICD-10-CM

## 2013-01-26 DIAGNOSIS — I10 Essential (primary) hypertension: Secondary | ICD-10-CM

## 2013-01-26 MED ORDER — HYDRALAZINE HCL 50 MG PO TABS: 50.0000 mg | ORAL_TABLET | Freq: Three times a day (TID) | ORAL | Status: DC

## 2013-01-26 MED ORDER — CARVEDILOL 12.5 MG PO TABS: 18.7500 mg | ORAL_TABLET | ORAL | Status: DC

## 2013-01-26 MED ORDER — CLOPIDOGREL BISULFATE 75 MG PO TABS: 75.0000 mg | ORAL_TABLET | Freq: Every day | ORAL | Status: DC

## 2013-01-26 MED ORDER — POTASSIUM CHLORIDE CRYS ER 20 MEQ PO TBCR: 20.0000 meq | EXTENDED_RELEASE_TABLET | Freq: Two times a day (BID) | ORAL | Status: DC

## 2013-01-26 MED ORDER — DOXAZOSIN MESYLATE 8 MG PO TABS: 8.0000 mg | ORAL_TABLET | Freq: Every day | ORAL | Status: DC

## 2013-01-26 MED ORDER — ISOSORBIDE MONONITRATE ER 60 MG PO TB24: 90.0000 mg | ORAL_TABLET | Freq: Every day | ORAL | Status: DC

## 2013-01-26 MED ORDER — AMLODIPINE BESYLATE 10 MG PO TABS: 10.0000 mg | ORAL_TABLET | Freq: Every day | ORAL | Status: DC

## 2013-01-26 MED ORDER — ATORVASTATIN CALCIUM 80 MG PO TABS: 80.0000 mg | ORAL_TABLET | Freq: Every day | ORAL | Status: DC

## 2013-01-26 MED ORDER — NITROGLYCERIN 0.4 MG SL SUBL: 0.4000 mg | SUBLINGUAL_TABLET | SUBLINGUAL | Status: DC | PRN

## 2013-01-26 MED ORDER — FUROSEMIDE 40 MG PO TABS: ORAL_TABLET | ORAL | Status: DC

## 2013-05-25 ENCOUNTER — Other Ambulatory Visit: Payer: Self-pay | Admitting: Urology

## 2013-05-28 ENCOUNTER — Encounter (HOSPITAL_BASED_OUTPATIENT_CLINIC_OR_DEPARTMENT_OTHER): Payer: Self-pay | Admitting: *Deleted

## 2013-05-28 NOTE — Progress Notes (Addendum)
NPO AFTER MN.  ARRIVE AT [id number].  NEEDS ISTAT 8. CURRENT EKG AND CXR IN EPIC AND CHART. WILL TAKE COREG, NORVASC, LIPITOR, CARDURA, AND IMDUR AM DOS W/ SIPS OF WATER.

## 2013-06-03 ENCOUNTER — Encounter (HOSPITAL_BASED_OUTPATIENT_CLINIC_OR_DEPARTMENT_OTHER)
Admission: RE | Disposition: A | Discharge status: Home / Self Care | Payer: Self-pay | Source: Ambulatory Visit | Attending: Urology

## 2013-06-03 ENCOUNTER — Encounter (HOSPITAL_BASED_OUTPATIENT_CLINIC_OR_DEPARTMENT_OTHER): Payer: Self-pay | Admitting: Anesthesiology

## 2013-06-03 ENCOUNTER — Ambulatory Visit (HOSPITAL_BASED_OUTPATIENT_CLINIC_OR_DEPARTMENT_OTHER)
Admission: RE | Admit: 2013-06-03 | Discharge: 2013-06-03 | Disposition: A | Discharge status: Home / Self Care | Payer: Medicare PPO | Source: Ambulatory Visit | Attending: Urology | Admitting: Urology

## 2013-06-03 ENCOUNTER — Encounter (HOSPITAL_BASED_OUTPATIENT_CLINIC_OR_DEPARTMENT_OTHER): Payer: Medicare PPO | Admitting: Anesthesiology

## 2013-06-03 ENCOUNTER — Ambulatory Visit (HOSPITAL_BASED_OUTPATIENT_CLINIC_OR_DEPARTMENT_OTHER): Payer: Medicare PPO | Admitting: Anesthesiology

## 2013-06-03 DIAGNOSIS — K219 Gastro-esophageal reflux disease without esophagitis: Secondary | ICD-10-CM | POA: Insufficient documentation

## 2013-06-03 DIAGNOSIS — G4730 Sleep apnea, unspecified: Secondary | ICD-10-CM | POA: Insufficient documentation

## 2013-06-03 DIAGNOSIS — I252 Old myocardial infarction: Secondary | ICD-10-CM | POA: Insufficient documentation

## 2013-06-03 DIAGNOSIS — Z7982 Long term (current) use of aspirin: Secondary | ICD-10-CM | POA: Insufficient documentation

## 2013-06-03 DIAGNOSIS — E7800 Pure hypercholesterolemia, unspecified: Secondary | ICD-10-CM | POA: Insufficient documentation

## 2013-06-03 DIAGNOSIS — I509 Heart failure, unspecified: Secondary | ICD-10-CM | POA: Insufficient documentation

## 2013-06-03 DIAGNOSIS — I10 Essential (primary) hypertension: Secondary | ICD-10-CM | POA: Insufficient documentation

## 2013-06-03 DIAGNOSIS — J449 Chronic obstructive pulmonary disease, unspecified: Secondary | ICD-10-CM | POA: Insufficient documentation

## 2013-06-03 DIAGNOSIS — I739 Peripheral vascular disease, unspecified: Secondary | ICD-10-CM | POA: Insufficient documentation

## 2013-06-03 DIAGNOSIS — N138 Other obstructive and reflux uropathy: Secondary | ICD-10-CM | POA: Insufficient documentation

## 2013-06-03 DIAGNOSIS — N401 Enlarged prostate with lower urinary tract symptoms: Secondary | ICD-10-CM | POA: Insufficient documentation

## 2013-06-03 DIAGNOSIS — N179 Acute kidney failure, unspecified: Secondary | ICD-10-CM | POA: Insufficient documentation

## 2013-06-03 DIAGNOSIS — I2510 Atherosclerotic heart disease of native coronary artery without angina pectoris: Secondary | ICD-10-CM | POA: Insufficient documentation

## 2013-06-03 DIAGNOSIS — Z9981 Dependence on supplemental oxygen: Secondary | ICD-10-CM | POA: Insufficient documentation

## 2013-06-03 DIAGNOSIS — J4489 Other specified chronic obstructive pulmonary disease: Secondary | ICD-10-CM | POA: Insufficient documentation

## 2013-06-03 DIAGNOSIS — Z8673 Personal history of transient ischemic attack (TIA), and cerebral infarction without residual deficits: Secondary | ICD-10-CM | POA: Insufficient documentation

## 2013-06-03 DIAGNOSIS — N139 Obstructive and reflux uropathy, unspecified: Secondary | ICD-10-CM | POA: Insufficient documentation

## 2013-06-03 DIAGNOSIS — Z87891 Personal history of nicotine dependence: Secondary | ICD-10-CM | POA: Insufficient documentation

## 2013-06-03 DIAGNOSIS — C679 Malignant neoplasm of bladder, unspecified: Secondary | ICD-10-CM | POA: Insufficient documentation

## 2013-06-03 DIAGNOSIS — E109 Type 1 diabetes mellitus without complications: Secondary | ICD-10-CM | POA: Insufficient documentation

## 2013-06-03 DIAGNOSIS — Z79899 Other long term (current) drug therapy: Secondary | ICD-10-CM | POA: Insufficient documentation

## 2013-06-03 HISTORY — DX: Old myocardial infarction: I25.2

## 2013-06-03 HISTORY — DX: Personal history of other specified conditions: Z87.898

## 2013-06-03 HISTORY — DX: Gastro-esophageal reflux disease without esophagitis: K21.9

## 2013-06-03 HISTORY — DX: Personal history of other diseases of the nervous system and sense organs: Z86.69

## 2013-06-03 HISTORY — DX: Obstructive sleep apnea (adult) (pediatric): G47.33

## 2013-06-03 HISTORY — DX: Personal history of transient ischemic attack (TIA), and cerebral infarction without residual deficits: Z86.73

## 2013-06-03 HISTORY — DX: Unspecified right bundle-branch block: I45.10

## 2013-06-03 HISTORY — PX: CYSTOSCOPY WITH BIOPSY: SHX5122

## 2013-06-03 HISTORY — DX: Chronic combined systolic (congestive) and diastolic (congestive) heart failure: I50.42

## 2013-06-03 HISTORY — DX: Unspecified osteoarthritis, unspecified site: M19.90

## 2013-06-03 HISTORY — DX: Frequency of micturition: R35.0

## 2013-06-03 HISTORY — DX: Chronic obstructive pulmonary disease, unspecified: J44.9

## 2013-06-03 HISTORY — DX: Type 2 diabetes mellitus with diabetic polyneuropathy: E11.42

## 2013-06-03 HISTORY — DX: Personal history of urinary calculi: Z87.442

## 2013-06-03 HISTORY — DX: Essential (primary) hypertension: I10

## 2013-06-03 LAB — POCT I-STAT, CHEM 8
BUN: 24 mg/dL — AB (ref 6–23)
Calcium, Ion: 1.29 mmol/L (ref 1.13–1.30)
Chloride: 104 mEq/L (ref 96–112)
Creatinine, Ser: 2.1 mg/dL — ABNORMAL HIGH (ref 0.50–1.35)
GLUCOSE: 142 mg/dL — AB (ref 70–99)
HCT: 33 % — ABNORMAL LOW (ref 39.0–52.0)
HEMOGLOBIN: 11.2 g/dL — AB (ref 13.0–17.0)
Potassium: 3.6 mEq/L — ABNORMAL LOW (ref 3.7–5.3)
Sodium: 142 mEq/L (ref 137–147)
TCO2: 21 mmol/L (ref 0–100)

## 2013-06-03 LAB — GLUCOSE, CAPILLARY: Glucose-Capillary: 134 mg/dL — ABNORMAL HIGH (ref 70–99)

## 2013-06-03 SURGERY — CYSTOSCOPY, WITH BIOPSY
Anesthesia: General | Site: Bladder

## 2013-06-03 MED ORDER — ONDANSETRON HCL 4 MG/2ML IJ SOLN
INTRAMUSCULAR | Status: DC | PRN
  Administered 2013-06-03: 4 mg via INTRAVENOUS

## 2013-06-03 MED ORDER — MIDAZOLAM HCL 5 MG/5ML IJ SOLN
INTRAMUSCULAR | Status: DC | PRN
  Administered 2013-06-03: 2 mg via INTRAVENOUS

## 2013-06-03 MED ORDER — LIDOCAINE HCL (CARDIAC) 20 MG/ML IV SOLN
INTRAVENOUS | Status: DC | PRN
Start: 2013-06-03 — End: 2013-06-03
  Administered 2013-06-03: 100 mg via INTRAVENOUS

## 2013-06-03 MED ORDER — PROPOFOL INFUSION 10 MG/ML OPTIME
INTRAVENOUS | Status: DC | PRN
  Administered 2013-06-03: 150 mL via INTRAVENOUS

## 2013-06-03 MED ORDER — MIDAZOLAM HCL 2 MG/2ML IJ SOLN
INTRAMUSCULAR | Status: AC
  Filled 2013-06-03: qty 2

## 2013-06-03 MED ORDER — CIPROFLOXACIN IN D5W 400 MG/200ML IV SOLN
400.0000 mg | INTRAVENOUS | Status: AC
  Administered 2013-06-03: 400 mg via INTRAVENOUS
  Filled 2013-06-03: qty 200

## 2013-06-03 MED ORDER — FENTANYL CITRATE 0.05 MG/ML IJ SOLN
INTRAMUSCULAR | Status: AC
  Filled 2013-06-03: qty 4

## 2013-06-03 MED ORDER — FENTANYL CITRATE 0.05 MG/ML IJ SOLN
25.0000 ug | INTRAMUSCULAR | Status: DC | PRN
  Filled 2013-06-03: qty 1

## 2013-06-03 MED ORDER — STERILE WATER FOR IRRIGATION IR SOLN
Status: DC | PRN
  Administered 2013-06-03: 6000 mL

## 2013-06-03 MED ORDER — MITOMYCIN CHEMO FOR BLADDER INSTILLATION 40 MG
40.0000 mg | Freq: Once | INTRAVENOUS | Status: AC
  Administered 2013-06-03: 40 mg via INTRAVESICAL
  Filled 2013-06-03: qty 40

## 2013-06-03 MED ORDER — FENTANYL CITRATE 0.05 MG/ML IJ SOLN
INTRAMUSCULAR | Status: DC | PRN
  Administered 2013-06-03: 100 ug via INTRAVENOUS

## 2013-06-03 MED ORDER — MITOMYCIN CHEMO FOR BLADDER INSTILLATION 40 MG: 40.0000 mg | Freq: Once | INTRAVENOUS | Status: DC

## 2013-06-03 MED ORDER — HYDROCODONE-ACETAMINOPHEN 5-325 MG PO TABS: 1.0000 | ORAL_TABLET | Freq: Four times a day (QID) | ORAL | Status: DC | PRN

## 2013-06-03 MED ORDER — DEXAMETHASONE SODIUM PHOSPHATE 10 MG/ML IJ SOLN
INTRAMUSCULAR | Status: DC | PRN
  Administered 2013-06-03: 10 mg via INTRAVENOUS

## 2013-06-03 MED ORDER — SODIUM CHLORIDE 0.9 % IV SOLN
INTRAVENOUS | Status: DC
  Administered 2013-06-03 (×2): via INTRAVENOUS
  Filled 2013-06-03: qty 1000

## 2013-06-03 SURGICAL SUPPLY — 28 items
BAG DRAIN URO-CYSTO SKYTR STRL (DRAIN) ×3 IMPLANT
BAG DRN UROCATH (DRAIN) ×1
CANISTER SUCT LVC 12 LTR MEDI- (MISCELLANEOUS) ×2 IMPLANT
CATH FOLEY 2WAY SLVR  5CC 16FR (CATHETERS) ×2
CATH FOLEY 2WAY SLVR 5CC 16FR (CATHETERS) IMPLANT
CATH ROBINSON RED A/P 14FR (CATHETERS) IMPLANT
CATH ROBINSON RED A/P 16FR (CATHETERS) IMPLANT
CLOTH BEACON ORANGE TIMEOUT ST (SAFETY) ×3 IMPLANT
DRAPE CAMERA CLOSED 9X96 (DRAPES) ×3 IMPLANT
ELECT REM PT RETURN 9FT ADLT (ELECTROSURGICAL) ×3
ELECTRODE REM PT RTRN 9FT ADLT (ELECTROSURGICAL) ×1 IMPLANT
GLOVE BIO SURGEON STRL SZ7.5 (GLOVE) ×3 IMPLANT
GLOVE BIOGEL PI IND STRL 7.5 (GLOVE) IMPLANT
GLOVE BIOGEL PI INDICATOR 7.5 (GLOVE) ×2
GLOVE SURG SS PI 7.5 STRL IVOR (GLOVE) ×2 IMPLANT
GOWN STRL REIN XL XLG (GOWN DISPOSABLE) ×1 IMPLANT
GOWN STRL REUS W/TWL LRG LVL3 (GOWN DISPOSABLE) ×2 IMPLANT
GOWN STRL REUS W/TWL XL LVL3 (GOWN DISPOSABLE) ×2 IMPLANT
HOLDER FOLEY CATH W/STRAP (MISCELLANEOUS) ×2 IMPLANT
NDL SAFETY ECLIPSE 18X1.5 (NEEDLE) IMPLANT
NEEDLE HYPO 18GX1.5 SHARP (NEEDLE)
NEEDLE HYPO 22GX1.5 SAFETY (NEEDLE) IMPLANT
NS IRRIG 500ML POUR BTL (IV SOLUTION) IMPLANT
PACK CYSTOSCOPY (CUSTOM PROCEDURE TRAY) ×3 IMPLANT
PLUG CATH AND CAP STER (CATHETERS) ×2 IMPLANT
SYR 20CC LL (SYRINGE) IMPLANT
SYR BULB IRRIGATION 50ML (SYRINGE) IMPLANT
WATER STERILE IRR 3000ML UROMA (IV SOLUTION) ×5 IMPLANT

## 2013-06-03 NOTE — Anesthesia Preprocedure Evaluation (Addendum)
Anesthesia Evaluation  Patient identified by MRN, date of birth, ID band Patient awake    Reviewed: Allergy & Precautions, H&P , NPO status , Patient's Chart, lab work & pertinent test results  Airway Mallampati: II TM Distance: >3 FB Neck ROM: Full    Dental no notable dental hx. (+) Dental Advisory Given, Lower Dentures, Upper Dentures   Pulmonary sleep apnea and Oxygen sleep apnea , pneumonia -, resolved, COPD oxygen dependent, former smoker,  He does not wear CPAP, but often uses wife's oxygen at night because it makes him feel better. breath sounds clear to auscultation  Pulmonary exam normal       Cardiovascular hypertension, Pt. on medications and Pt. on home beta blockers + CAD, + Past MI, + Peripheral Vascular Disease and +CHF negative cardio ROS  + dysrhythmias Rhythm:Regular Rate:Normal  No recent cardiopulmonary complaints.  Cardiology office note August 2014 reviewed. Pulmonary office note August 2014 reviewed.  ECHO November 2014 EF 45-50%. Mildly increased PA pressures.   Neuro/Psych He states he has generalized weakness since his stroke, but able to walk , sometimes with cane. TIA Neuromuscular disease CVA negative psych ROS   GI/Hepatic Neg liver ROS, GERD-  ,  Endo/Other  diabetes, Type 1, Insulin Dependent  Renal/GU Renal Insufficiency and ARFRenal disease  negative genitourinary   Musculoskeletal negative musculoskeletal ROS (+)   Abdominal   Peds negative pediatric ROS (+)  Hematology  (+) anemia ,   Anesthesia Other Findings Dentures remain in place per pt request.  Reproductive/Obstetrics negative OB ROS                      Anesthesia Physical Anesthesia Plan  ASA: III  Anesthesia Plan: General   Post-op Pain Management:    Induction: Intravenous  Airway Management Planned: LMA  Additional Equipment:   Intra-op Plan:   Post-operative Plan: Extubation in  OR  Informed Consent: I have reviewed the patients History and Physical, chart, labs and discussed the procedure including the risks, benefits and alternatives for the proposed anesthesia with the patient or authorized representative who has indicated his/her understanding and acceptance.   Dental advisory given  Plan Discussed with: CRNA  Anesthesia Plan Comments:         Anesthesia Quick Evaluation

## 2013-06-03 NOTE — Transfer of Care (Signed)
Immediate Anesthesia Transfer of Care Note  Patient: Joshua Vincent  Procedure(s) Performed: Procedure(s): CYSTOSCOPY WITH COLD CUP RESECTION/MITOMYCIN INSTILLATION in PACU (N/A)  Patient Location: PACU  Anesthesia Type:General  Level of Consciousness: awake, alert , oriented and patient cooperative  Airway & Oxygen Therapy: Patient Spontanous Breathing and Patient connected to nasal cannula oxygen  Post-op Assessment: Report given to PACU RN and Post -op Vital signs reviewed and stable  Post vital signs: Reviewed and stable  Complications: No apparent anesthesia complications

## 2013-06-03 NOTE — Anesthesia Postprocedure Evaluation (Signed)
  Anesthesia Post-op Note  Patient: Joshua Vincent  Procedure(s) Performed: Procedure(s) (LRB): CYSTOSCOPY WITH COLD CUP RESECTION/MITOMYCIN INSTILLATION in PACU (N/A)  Patient Location: PACU  Anesthesia Type: General  Level of Consciousness: awake and alert   Airway and Oxygen Therapy: Patient Spontanous Breathing  Post-op Pain: mild  Post-op Assessment: Post-op Vital signs reviewed, Patient's Cardiovascular Status Stable, Respiratory Function Stable, Patent Airway and No signs of Nausea or vomiting  Last Vitals:  Filed Vitals:   06/03/13 1145  BP: 171/74  Pulse: 52  Temp:   Resp: 16    Post-op Vital Signs: stable   Complications: No apparent anesthesia complications

## 2013-06-03 NOTE — Op Note (Signed)
Preoperative diagnosis: Recurrent transitional cell carcinoma bladder Postoperative diagnosis: Same  Procedure: Cold cup resection of multiple bladder cancers   Surgeon: Bernestine Amass M.D.  Anesthesia: Gen.  Indications: Patient is currently 79 years of age. He has a history of transitional cell carcinoma the bladder which have been high grade in the past but noninvasive. The patient did receive BCG as an induction course back in 2011. Followups since then have been negative until recently. On office cystoscopy the patient was noted to have an area of papillary tumor recurrence near the dome of his bladder. He presents now for more careful reassessment of the entire bladder with resection and fulguration of recurrent tumor.     Technique and findings: Patient was brought the operating room where he had successful induction general anesthesia. He received perioperative antibiotics and placement of PAS compression boots. Appropriate surgical timeout was performed. Cystoscopic assessment of the bladder was performed with 12 as well as 70 lens systems. The patient was noted to have an area of bladder tumor carpeting the junction of the posterior wall and dome of the bladder. There was a second 2 cm x 2 cm area on the posterior wall and a additional 1 x 1 cm tumor involving the right hemitrigone/lateral wall lateral to the right ureteral orifice. All these tumors appeared papillary and had a low-grade noninvasive appearance.   Each of these areas were cold cup resection. To do 6 cold cup resection as were performed and each of these 3 areas which were sent separately. The biopsy sites were then fulgurated with a Bugbee electrode an additional tumor carpeting the areas between the resections were fulgurated. At completion the procedure hemostasis was excellent. There was no evidence of bladder perforation or residual tumor. A 16 French Foley catheter was inserted in anticipation of inserting mitomycin into  the patient's bladder in the PACU. -No obvious complications or problems occurred.

## 2013-06-03 NOTE — Discharge Instructions (Signed)
Transurethral Resection of Bladder Tumor (TURBT)   Definition:  Transurethral Resection of the Bladder Tumor is a surgical procedure used to diagnose and remove tumors within the bladder. TURBT is the most common treatment for early stage bladder cancer.  General instructions:     Your recent bladder surgery requires very little post hospital care but some definite precautions.  Despite the fact that no skin incisions were used, the area around the bladder incisions are raw and covered with scabs to promote healing and prevent bleeding. Certain precautions are needed to insure that the scabs are not disturbed over the next 2-4 weeks while the healing proceeds.  Because the raw surface inside your bladder and the irritating effects of urine you may expect frequency of urination and/or urgency (a stronger desire to urinate) and perhaps even getting up at night more often. This will usually resolve or improve slowly over the healing period. You may see some blood in your urine over the first 6 weeks. Do not be alarmed, even if the urine was clear for a while. Get off your feet and drink lots of fluids until clearing occurs. If you start to pass clots or don't improve call us.  Diet:  You may return to your normal diet immediately. Because of the raw surface of your bladder, alcohol, spicy foods, foods high in acid and drinks with caffeine may cause irritation or frequency and should be used in moderation. To keep your urine flowing freely and avoid constipation, drink plenty of fluids during the day (8-10 glasses). Tip: Avoid cranberry juice because it is very acidic.  Activity:  Your physical activity doesn't need to be restricted. However, if you are very active, you may see some blood in the urine. We suggest that you reduce your activity under the circumstances until the bleeding has stopped.  Bowels:  It is important to keep your bowels regular during the postoperative period. Straining  with bowel movements can cause bleeding. A bowel movement every other day is reasonable. Use a mild laxative if needed, such as milk of magnesia 2-3 tablespoons, or 2 Dulcolax tablets. Call if you continue to have problems. If you had been taking narcotics for pain, before, during or after your surgery, you may be constipated. Take a laxative if necessary.    Medication:  You should resume your pre-surgery medications unless told not to. In addition you may be given an antibiotic to prevent or treat infection. Antibiotics are not always necessary. All medication should be taken as prescribed until the bottles are finished unless you are having an unusual reaction to one of the drugs.  He may restart her aspirin after 3 days     Post Anesthesia Home Care Instructions  Activity: Get plenty of rest for the remainder of the day. A responsible adult should stay with you for 24 hours following the procedure.  For the next 24 hours, DO NOT: -Drive a car -Paediatric nurse -Drink alcoholic beverages -Take any medication unless instructed by your physician -Make any legal decisions or sign important papers.  Meals: Start with liquid foods such as gelatin or soup. Progress to regular foods as tolerated. Avoid greasy, spicy, heavy foods. If nausea and/or vomiting occur, drink only clear liquids until the nausea and/or vomiting subsides. Call your physician if vomiting continues.  Special Instructions/Symptoms: Your throat may feel dry or sore from the anesthesia or the breathing tube placed in your throat during surgery. If this causes discomfort, gargle with warm salt water. The  discomfort should disappear within 24 hours.

## 2013-06-03 NOTE — Anesthesia Procedure Notes (Signed)
Procedure Name: LMA Insertion Date/Time: 06/03/2013 10:01 AM Performed by: Wanita Chamberlain Pre-anesthesia Checklist: Patient identified, Timeout performed, Emergency Drugs available, Suction available and Patient being monitored Patient Re-evaluated:Patient Re-evaluated prior to inductionOxygen Delivery Method: Circle system utilized Preoxygenation: Pre-oxygenation with 100% oxygen Intubation Type: IV induction Ventilation: Mask ventilation without difficulty LMA: LMA inserted LMA Size: 5.0 Number of attempts: 1 Airway Equipment and Method: Bite block Placement Confirmation: breath sounds checked- equal and bilateral Tube secured with: Tape Dental Injury: Teeth and Oropharynx as per pre-operative assessment  Comments: Dentures remain in place and intact

## 2013-06-03 NOTE — H&P (Signed)
Reason For Visit             [name] returns for routine bladder cancer followup with cystoscopy. His original tumor was diagnosed approximately 4 years ago.  His only recurrence was in December 2013 when he had fulguration for several small areas of papillary recurrence  Overall voiding patterns are unchanged. Urinalysis today shows nothing of concern.   On his last to follow-up cystoscopies he's had some slight erythema that does not appear to have progressed. No definitive areas of tumor recurrence.  His last urine cytology was done in August 2014   History of Present Illness   Past urologic history:     [name] returns for follow-up and cystoscopy. There are several urologic issues but most recently transitional cell carcinoma of the bladder. The patient developed gross hematuria. CT Scan in May of 2011 showed nothing of concern. The patient was eventually diagnosed with multiple high-grade noninvasive transitional carcinomas and underwent a resection back in 05/2009. Three discrete tumors were noted. The patient recovered from that well. Of note, he did have a urethral stricture disease that required balloon dilation. The patient subsequently received BCG therapy. He did well with one-third strength BCG q week x6 ( completed in 9/ 2011).  No other clinical change. His left inguinal hernia was repaired in 2/ 2012. Cytology and cystoscopy negative in 12/ 2011. Stream so so.  History of TURP in 2003 by myself.  04/2010: NEGATIVE CYSTOSCOPY and NMP-22.  08/2010: NEGATIVE CYSTOSCOPY and CYTOLOGY.  12/2010:NEGATIVE CYSTOSCOPY ( bulbar stricture noted but not manipulated)  04/2011: Negative cystoscopy with unmanipulated bulbar stricture. NMP 22 testing negative.  12/2011 the patient underwent fulguration for several small areas of papillary tumor recurrence.  03/2012: Negative cystoscopy and NMP-22  8/ 2014: Negative cystoscopy and urine cytology.   12/ 2014:Negative  Cystoscopy.     Past Medical History Problems  1. History of Arthritis (V13.4) 2. History of Chronic Peptic Ulcer (533.70) 3. History of diabetes mellitus (V12.29) 4. History of glaucoma (V12.49) 5. History of heartburn (V12.79) 6. History of hypercholesterolemia (V12.29) 7. History of hypertension (V12.59) 8. 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 History of Stroke syndrome (434.91)  Surgical History Problems  1. History of Appendectomy 2. History of Cystoscopy For Urethral Stricture 3. History of Cystoscopy With Fulguration Medium Lesion (2-5cm) 4. History of Heart Surgery 5. History of Hernia Repair 6. Prostate Surgery  Current Meds 1. Aspirin 81 MG Oral Tablet;  Therapy: (Recorded:05Apr2012) to Recorded 2. Atorvastatin Calcium 80 MG Oral Tablet;  Therapy: [id number] to Recorded 3. BD Pen Needle Mini U/F 31G X 5 MM Miscellaneous;  Therapy: 201 [id number] 0402 to Recorded 4. Cardura TABS;  Therapy: (Recorded:18Dec2007) to Recorded 5. Chlorthalidone TABS;  Therapy: (Recorded:18Dec2007) to Recorded 6. Clopidogrel Bisulfate 75 MG Oral Tablet;  Therapy: [id number] to Recorded 7. CoQ-10 CAPS;  Therapy: (Recorded:05Apr2012) to Recorded 8. Furosemide 40 MG Oral Tablet;  Therapy: 11Apr2012 to Recorded 9. Lantus SoloStar 100 UNIT/ML SOLN;  Therapy: [id number] to Recorded 10. Nabumetone 500 MG Oral Tablet;   Therapy: (Recorded:23Dec2008) to Recorded 11. Naproxen 375 MG Oral Tablet;   Therapy: 09Aug2013 to Recorded 12. Quinapril HCl TABS;   Therapy: (Recorded:18Dec2007) to Recorded 13. Simvastatin TABS;   Therapy: (Recorded:23Aug2011) to Recorded 14. TraMADol HCl - 50 MG Oral Tablet;   Therapy: 09Aug2013 to Recorded 15. Vitamin A TABS;   Therapy: (Recorded:05Apr2012) to Recorded 16. Vitamin C TABS;   Therapy: (Recorded:05Apr2012) to Recorded 17. Vitamin D TABS;   Therapy: (Recorded:05Apr2012) to Recorded 18. Vitamin E  TABS;   Therapy: (Recorded:05Apr2012) to  Recorded  Allergies Medication  1. No Known Drug Allergies  Family History Problems  1. Family history of Family Health Status Number Of Children   3 sons 2. Family history of Father Deceased At Age ____   64y/o 3. Family history of Mother Deceased At Age ____   20y/o  Social History Problems  1. Denied: History of Alcohol Use 2. Caffeine Use   4 qd 3. Marital History - Currently Married 4. Occupation:   security gurard 5. History of Tobacco Use (V15.82)   quit 25 years ago; smoked two packs per day  Review of Systems Genitourinary, constitutional, skin, eye, otolaryngeal, hematologic/lymphatic, cardiovascular, pulmonary, endocrine, musculoskeletal, gastrointestinal, neurological and psychiatric system(s) were reviewed and pertinent findings if present are noted.  Genitourinary: urinary frequency, feelings of urinary urgency, nocturia, weak urinary stream and initiating urination requires straining, but no dysuria, no difficulty starting the urinary stream, no incomplete emptying of bladder and no hematuria.  Gastrointestinal: heartburn.  Constitutional: night sweats and feeling tired (fatigue).  Eyes: blurred vision.  ENT: sinus problems.  Respiratory: shortness of breath during exertion.  Musculoskeletal: bone pain, back pain, joint pain, joint swelling, joint stiffness and limb pain.    Vitals Vital Signs [Data Includes: Last 1 Day]  Recorded: [id number] 01:49PM  Blood Pressure: 146 / 73 Temperature: 98 F Heart Rate: 46  Well-developed well-nourished male in no acute distress Respiratory: Normal effort Cardiac: Regular rate and rhythm Abdomen: Soft nontender no obvious palpable masses Extremities: Symmetric no edema Neurologic: Grossly nonfocal  Results/Data Urine [Data Includes: Last 1 Day]   [id number] COLOR AMBER  APPEARANCE CLEAR  SPECIFIC GRAVITY 1.015  pH 6.0  GLUCOSE NEG mg/dL BILIRUBIN NEG  KETONE NEG mg/dL BLOOD NEG  PROTEIN > 300  mg/dL UROBILINOGEN 0.2 mg/dL NITRITE NEG  LEUKOCYTE ESTERASE NEG  SQUAMOUS EPITHELIAL/HPF RARE  WBC 0-2 WBC/hpf RBC 0-2 RBC/hpf BACTERIA RARE  CRYSTALS NONE SEEN  CASTS Hyaline casts noted   Procedure  Procedure: Cystoscopy  Chaperone Present: brandy.  Indication: Urethral Stricture. History of Urothelial Carcinoma.  Informed Consent: Risks, benefits, and potential adverse events were discussed and informed consent was obtained from the patient . Specific risks including, but not limited to bleeding, infection, pain, allergic reaction etc. were explained.  Prep: The patient was prepped with betadine.  Anesthesia:. Local anesthesia was administered intraurethrally with 2% lidocaine jelly.  Antibiotic prophylaxis: Ciprofloxacin.  Procedure Note:  Urethral meatus:. No abnormalities.  Anterior urethra:. A mild stricture was present in the bulbar urethra but was not manipulated.  Prostatic urethra: No abnormalities.  Bladder: Visulization was clear. Examination of the bladder demonstrated erythematous mucosa located on the posterior aspect, near the trigone of the bladder . A solitary tumor was visualized in the bladder. A papillary tumor was seen in the bladder measuring approximately 2 cm in size. The patient tolerated the procedure well.  Complications: None.    Assessment Assessed  1. Benign prostatic hyperplasia with urinary obstruction (600.01,599.69) 2. Bladder cancer (188.9)  Plan  Bladder cancer  1. Follow-up Schedule Surgery Office  Follow-up  Status: Hold For - Appointment   Requested for: [id number]  Cysto; Status:Hold For - Appointment,Date of Service; Requested for:24Apr2015;  Perform:AUS; Due:26Apr2015; Marked Important;Ordered; Today;  KGM:WNUUVOZ cancer; Ordered DG:UYQIHK, Shanon Brow;   Discussion/Summary        Kavan does have evidence of a tumor recurrence in his bladder dome this appears to be primarily papillary and I hope will be low-grade and noninvasive. It  may be a difficult place to access. Do not feel comfortable doing an office fulguration on this tumor. I would also like to biopsy this area of erythema to rule out carcinoma in situ. I believe this can be done as an outpatient. Hopefully he will not need to have to go home with a Foley. We will want to try to instill mitomycin. His overall voiding is satisfactory. He continues to have at least moderate obstructive and irritative voiding symptoms change significantly. His urine is clear today.     Signatures Electronically signed by : Rana Snare, M.D.; May 21 2013  2:15PM EST

## 2013-06-04 ENCOUNTER — Encounter (HOSPITAL_BASED_OUTPATIENT_CLINIC_OR_DEPARTMENT_OTHER): Payer: Self-pay | Admitting: Urology

## 2013-07-07 ENCOUNTER — Other Ambulatory Visit: Payer: Self-pay | Admitting: Cardiology

## 2013-07-12 ENCOUNTER — Other Ambulatory Visit: Payer: Self-pay | Admitting: Cardiology

## 2013-07-13 ENCOUNTER — Other Ambulatory Visit: Payer: Self-pay | Admitting: *Deleted

## 2013-07-13 MED ORDER — ISOSORBIDE MONONITRATE ER 60 MG PO TB24: 60.0000 mg | ORAL_TABLET | Freq: Every day | ORAL | Status: DC

## 2013-07-20 ENCOUNTER — Ambulatory Visit (INDEPENDENT_AMBULATORY_CARE_PROVIDER_SITE_OTHER): Payer: Medicare PPO | Admitting: Physician Assistant

## 2013-07-20 ENCOUNTER — Encounter: Payer: Self-pay | Admitting: Physician Assistant

## 2013-07-20 VITALS — BP 120/62 | HR 74 | Ht 72.0 in | Wt 193.0 lb

## 2013-07-20 DIAGNOSIS — I2510 Atherosclerotic heart disease of native coronary artery without angina pectoris: Secondary | ICD-10-CM

## 2013-07-20 DIAGNOSIS — N189 Chronic kidney disease, unspecified: Secondary | ICD-10-CM | POA: Insufficient documentation

## 2013-07-20 DIAGNOSIS — I5042 Chronic combined systolic (congestive) and diastolic (congestive) heart failure: Secondary | ICD-10-CM

## 2013-07-20 DIAGNOSIS — I10 Essential (primary) hypertension: Secondary | ICD-10-CM

## 2013-07-20 DIAGNOSIS — E785 Hyperlipidemia, unspecified: Secondary | ICD-10-CM

## 2013-07-20 DIAGNOSIS — C679 Malignant neoplasm of bladder, unspecified: Secondary | ICD-10-CM | POA: Insufficient documentation

## 2013-07-20 DIAGNOSIS — G4733 Obstructive sleep apnea (adult) (pediatric): Secondary | ICD-10-CM

## 2013-07-20 MED ORDER — FUROSEMIDE 40 MG PO TABS: ORAL_TABLET | ORAL | Status: DC

## 2013-07-20 NOTE — Progress Notes (Signed)
Cardiology Office Note    Date:  07/20/2013   ID:  Joshua Vincent, DOB 06/09/1934, MRN [id number]  PCP:  Donnie Coffin, MD  Cardiologist:  Dr. Loralie Champagne >>> [name]     History of Present Illness: Joshua Vincent is a 79 y.o. male with a hx of diastolic CHF, CAD s/p NSTEMI in 12/10 accompanied by pulmonary edema and peri-catheterization CVA, now s/p CABG. He was admitted in 8/13 with NSTEMI. LHC showed severe disease in the RCA and occlusion of the LAD and LCX. SVG-Dx and SVG-RCA were occluded. SVG-OM and LIMA-LAD were patent. EF was 45% by LV-gram but echo done in 9/13 showed EF 55-60% .   He was admitted in 6/14 with NSTEMI (TnI to 12) and acute systolic CHF. EF was 15% by echo (newly decreased). He was begun on milrinone and IV Lasix with good diuresis. LHC showed stable anatomy with patent LIMA-LAD and SVG-OM. There was severe diffuse disease in small PDA and small PLV. No intervention. He was also admitted in 8/14 with transient aphasia. MRI did not show acute infarct. Suspected TIA secondary to small vessel disease. Echo in 8/14 showed surprising EF recovery to 50-55%.   Admitted 11/4 for A/C combined CHF. EF was 45-50% by echo.  He was seen in f/u at the [facility] 11/30/12.  He was to be set up to see Para-medicine.    He returns for follow up.  His children recently decided to take over his medications and medical care.  He moved into [facility] in [place] (independent living).  He follows up today to make sure he is taking appropriate medications.  Prior to his children helping him, there were times he did not take his medications correctly.  He is doing well.  He denies chest pain, significant dyspnea, orthopnea, PND or significant edema.  He is NYHA 2-2b.  He is not wearing CPAP (cannot tolerate it).     Studies:  - LHC (06/2012):  Proximal LAD occluded, proximal circumflex occluded, mid RCA 30%, distal RCA 30%, PDA 90%, PL branch 1 proximal 95%, LIMA-LAD patent, SVG-OM  patent (SVG-RCA and SVG-diagonal known to be occluded)  - Echo (11/2012):  Mild LVH, EF 45-50%, diffuse HK, grade 2 diastolic dysfunction, mild MR, moderate LAE, PASP 42 mm Hg  - Carotid US (08/2012):  Bilateral ICA <39%   Recent Labs: 10/22/2012: ALT 17; HDL Cholesterol by NMR 31.00*; LDL (calc) 86  11/09/2012: Pro B Natriuretic peptide (BNP) 29103.0*  06/03/2013: Creatinine 2.10*; Hemoglobin 11.2*; Potassium 3.6* 06/09/2013:  Hgb 11.7, Cr 2.03, K 3.5, ALT 27, LDL 87, TSH 5   Wt Readings from Last 3 Encounters:  07/20/13 193 lb (87.544 kg)  06/03/13 197 lb (89.359 kg)  06/03/13 197 lb (89.359 kg)     Past Medical History  Diagnosis Date  . Benign prostatic hypertrophy   . Diabetes mellitus, type 2   . PVC's (premature ventricular contractions)     3 week  event monitor 8/11: no signifiant arrhythmias, occ PVCs  . CKD (chronic kidney disease), stage IV     a. Baseline Cr 1.4. b. AKI 06/2012 with Cr at discharge around 2.2.  . Bladder cancer   . Hypertension   . Diabetic peripheral neuropathy   . History of CVA (cerebrovascular accident) NO RESIDUALS    peri-cardiac cath 12-16-2008--  right dorsal midbrain embolic infarc--  carotid dopplers with no sig. disease (double vision resolved)  . History of kidney stones   . OA (osteoarthritis)  right shoulder  . History of TIA (transient ischemic attack) NO RESIDUAL    08-18-2012-- SMALL VESSEL DISEASE  . HLD (hyperlipidemia)   . History of urinary retention   . GERD (gastroesophageal reflux disease)   . RBBB (right bundle branch block)   . CAD (coronary artery disease) CARDIOLOGIST-  DR MCLEAN    a. 4v CABG (1/11): LIMA LAD, SVG-D, SVG-OM, SVG-PLV. b. NSTEMI 08/2011 cath  revealed severe native CAD, patent SVG to OM & LIMA to LAD, occluded SVG to RCA and SVG to Diagonal, EF 40%, no culprit lesion for PCI, med therapy - f/u EF 55-60%. c. NSTEMI 06/2012 - stable anatomy by cath, med rx recommended.  Marland Kitchen History of non-ST elevation  myocardial infarction (NSTEMI)     12/2008  NSTEMI W/ PULMONARY EDEMA &  PERICATHETERIZATION CVA( S/P CABG)/   08-19-2011 --  type 2 NSTEMI ([place])  &  06-30-2012--  NSTEMI &  ACUTE SYSTOLIC CHF (EF 72% PER ECHO)  MED. THERAPY  . COPD (chronic obstructive pulmonary disease)   . Systolic and diastolic CHF, chronic     a. EF 40% by cath 08/2011; EF 55-60% by echo 09/2011. b. EF drop to 15% by echo 06/2012;  c. 11/2012 Echo: EF 45-50%, diff HK, Gr 2 DD, mild MR, mod dil LA, PASP 42  . Frequency of urination   . OSA (obstructive sleep apnea)     PULMOLOGIST-  DR CLANCE--  PT NON-COMPLIANT CPAP BUT STATETS IS USING OXYGEN AT NIGHT VIA [place]  . History of glaucoma     BILATERAL LASER TX    Current Outpatient Prescriptions  Medication Sig Dispense Refill  . amLODipine (NORVASC) 10 MG tablet Take 10 mg by mouth every morning.      Marland Kitchen aspirin 81 MG tablet Take 81 mg by mouth daily.      Marland Kitchen atorvastatin (LIPITOR) 80 MG tablet Take 80 mg by mouth every morning.      . carvedilol (COREG) 12.5 MG tablet Take 18.75 mg by mouth 2 (two) times daily. 1 and 1/2 tablets twice a day      . Cholecalciferol (VITAMIN D3) 1000 UNITS CAPS Take 1 capsule by mouth daily.      . clopidogrel (PLAVIX) 75 MG tablet Take 1 tablet (75 mg total) by mouth daily with breakfast.  90 tablet  3  . doxazosin (CARDURA) 8 MG tablet Take 8 mg by mouth at bedtime.       . furosemide (LASIX) 40 MG tablet Take by mouth 2 (two) times daily. Takes one in the AM, One in the afternoon, and one at bedtime      . hydrALAZINE (APRESOLINE) 50 MG tablet Take 1 tablet (50 mg total) by mouth 3 (three) times daily.  270 tablet  1  . Insulin Glargine (LANTUS SOLOSTAR [place]) Inject 25 Units into the skin every evening.       . isosorbide mononitrate (IMDUR) 60 MG 24 hr tablet Take 60 mg by mouth daily. One pill in the AM, 1/2 at bedtime      . nitroGLYCERIN (NITROSTAT) 0.4 MG SL tablet Place 1 tablet (0.4 mg total) under the tongue every 5 (five) minutes  as needed for chest pain.  100 tablet  0  . potassium chloride SA (K-DUR,KLOR-CON) 20 MEQ tablet Take 1 tablet (20 mEq total) by mouth 2 (two) times daily.  180 tablet  3  . vitamin C (ASCORBIC ACID) 500 MG tablet Take 500 mg by mouth daily.  No current facility-administered medications for this visit.    Allergies:   Niacin   Social History:  The patient  reports that he quit smoking about 23 years ago. His smoking use included Cigarettes. He has a 40 pack-year smoking history. He has never used smokeless tobacco. He reports that he drinks alcohol. He reports that he does not use illicit drugs.   Family History:  The patient's family history includes Aneurysm in his brother and father; Heart disease in his sister; Osteoarthritis in his sister.   ROS:  Please see the history of present illness.   He recently had multiple bladder tumors removed.  He continues to have some hematuria.   All other systems reviewed and negative.   PHYSICAL EXAM: VS:  BP 120/62  Pulse 74  Ht 6' (1.829 m)  Wt 193 lb (87.544 kg)  BMI 26.17 kg/m2 Well nourished, well developed, in no acute distress HEENT: normal Neck: no JVD Cardiac:  normal S1, S2; RRR; no murmur Lungs:  clear to auscultation bilaterally, no wheezing, rhonchi or rales Abd: soft, nontender, no hepatomegaly Ext: no edema Skin: warm and dry Neuro:  CNs 2-12 intact, no focal abnormalities noted  EKG:  NSR, HR 74, RBBB, PVC, no change from prior tracing    ASSESSMENT AND PLAN:  1. Chronic combined systolic and diastolic heart failure:  Volume stable.  He is taking Lasix TID.  I suggested he change it to 60 mg bid for ease of administration.   2. Atherosclerosis of native coronary artery of native heart without angina pectoris:  No angina.  Continue ASA, statin, beta blocker, Plavix. 3. HYPERTENSION:  Controlled.  He thinks his BP may be low sometimes.  If it starts to run lower, I would recommend eliminating Cardura 1st.   4. CKD  (chronic kidney disease), unspecified stage:  Recent Creatinine stable.  5. DYSLIPIDEMIA:  Continue statin. 6. OSA (obstructive sleep apnea):  F/u with Pulmonology to help with CPAP compliance.  7. Malignant neoplasm of urinary bladder, unspecified site:  F/u with Dr. Risa Grill. 8. Disposition:  He now lives in [place] and prefers to follow up there.  I will have him establish with Dr. Rockey Situ or Dr. Fletcher Anon.    Signed, Versie Starks, MHS 07/20/2013 10:47 AM    [facility] [place], [place], [place]  [address] Phone: [phone]; Fax: [phone]

## 2013-07-20 NOTE — Patient Instructions (Signed)
CHANGE LASIX TO 60 MG TWICE DAILY; THIS WILL BE 1 AND 1/2 TABS TWICE DAILY  Your physician recommends that you schedule a follow-up appointment in: [facility]

## 2013-07-22 ENCOUNTER — Other Ambulatory Visit: Payer: Self-pay

## 2013-07-22 MED ORDER — ISOSORBIDE MONONITRATE ER 60 MG PO TB24: ORAL_TABLET | ORAL | Status: DC

## 2013-08-18 ENCOUNTER — Other Ambulatory Visit: Payer: Self-pay | Admitting: Cardiology

## 2013-08-21 ENCOUNTER — Ambulatory Visit (INDEPENDENT_AMBULATORY_CARE_PROVIDER_SITE_OTHER): Payer: Medicare PPO | Admitting: Pulmonary Disease

## 2013-08-21 ENCOUNTER — Encounter: Payer: Self-pay | Admitting: Pulmonary Disease

## 2013-08-21 VITALS — BP 122/70 | HR 74 | Temp 97.8°F | Ht 71.0 in | Wt 194.0 lb

## 2013-08-21 DIAGNOSIS — G4733 Obstructive sleep apnea (adult) (pediatric): Secondary | ICD-10-CM

## 2013-08-21 NOTE — Assessment & Plan Note (Addendum)
The patient had been doing fairly well with CPAP except for nasal dryness, but the last few months has had issues where he awakens in the early morning hours with a feeling of choking and inability to breathe. It is unclear if this is simply a collection of mucus in his throat from postnasal drip, or whether this could be a cardiopulmonary issue. I would think it is unlikely to be a pressure issues, since he is able to initiate sleep and do well during the first part of the night.  However, he has been on auto and it may be that he does worse later in the night when osa is worse and pressure is highest?  Will treat his PND and allergies to see if this improves, but if continues, will put him on a fixed pressure of 10cm to see if things improve.

## 2013-08-21 NOTE — Patient Instructions (Signed)
Try dymista 2 sprays each nostril each evening before going to bed. Will send an order to [facility] to check your machine and mask fit. Please call me in a few weeks to give me update with how things are going.  Try and be specific about symptoms when you awaken.

## 2013-08-21 NOTE — Progress Notes (Signed)
   Subjective:    Patient ID: Joshua Vincent, male    DOB: May 02, 1934, 79 y.o.   MRN: [id number]  HPI The patient comes in today for followup of his obstructive sleep apnea. He has had difficulty wearing his device the last few months because of awakening in the early morning hours with a feeling of choking/smothering. He does not have any chest discomfort at that time, but does note mucus collected in his throat. He does have a long-standing problem with postnasal drip and allergy symptoms. He denies any reflux symptoms. The patient states that he is able to initiate sleep during the first half of the night easily with CPAP without issue.   Review of Systems  Constitutional: Negative for fever and unexpected weight change.  HENT: Negative for congestion, dental problem, ear pain, nosebleeds, postnasal drip, rhinorrhea, sinus pressure, sneezing, sore throat and trouble swallowing.   Eyes: Negative for redness and itching.  Respiratory: Negative for cough, chest tightness, shortness of breath and wheezing.   Cardiovascular: Negative for palpitations and leg swelling.  Gastrointestinal: Negative for nausea and vomiting.  Genitourinary: Negative for dysuria.  Musculoskeletal: Negative for joint swelling.  Skin: Negative for rash.  Neurological: Negative for headaches.  Hematological: Does not bruise/bleed easily.  Psychiatric/Behavioral: Negative for dysphoric mood. The patient is not nervous/anxious.        Objective:   Physical Exam Well-developed male in no acute distress Nose without purulence or discharge noted Neck without lymphadenopathy or thyromegaly Lower extremities with mild edema, no cyanosis Alert and oriented, moves all 4 extremities.       Assessment & Plan:

## 2013-09-04 ENCOUNTER — Ambulatory Visit: Payer: Self-pay | Admitting: Pulmonary Disease

## 2013-10-14 ENCOUNTER — Other Ambulatory Visit: Payer: Self-pay

## 2013-10-14 DIAGNOSIS — I10 Essential (primary) hypertension: Secondary | ICD-10-CM

## 2013-10-14 DIAGNOSIS — I5042 Chronic combined systolic (congestive) and diastolic (congestive) heart failure: Secondary | ICD-10-CM

## 2013-10-14 MED ORDER — FUROSEMIDE 40 MG PO TABS: ORAL_TABLET | ORAL | Status: DC

## 2013-11-11 ENCOUNTER — Institutional Professional Consult (permissible substitution): Payer: Self-pay | Admitting: Cardiovascular Disease

## 2013-12-02 ENCOUNTER — Encounter: Payer: Self-pay | Admitting: Cardiovascular Disease

## 2013-12-02 ENCOUNTER — Ambulatory Visit (INDEPENDENT_AMBULATORY_CARE_PROVIDER_SITE_OTHER): Payer: Medicare PPO | Admitting: Cardiovascular Disease

## 2013-12-02 VITALS — BP 108/62 | HR 64 | Ht 71.0 in | Wt 192.0 lb

## 2013-12-02 DIAGNOSIS — I2510 Atherosclerotic heart disease of native coronary artery without angina pectoris: Secondary | ICD-10-CM

## 2013-12-02 DIAGNOSIS — N183 Chronic kidney disease, stage 3 (moderate): Secondary | ICD-10-CM

## 2013-12-02 DIAGNOSIS — IMO0002 Reserved for concepts with insufficient information to code with codable children: Secondary | ICD-10-CM

## 2013-12-02 DIAGNOSIS — I5042 Chronic combined systolic (congestive) and diastolic (congestive) heart failure: Secondary | ICD-10-CM

## 2013-12-02 DIAGNOSIS — E118 Type 2 diabetes mellitus with unspecified complications: Secondary | ICD-10-CM

## 2013-12-02 DIAGNOSIS — E1165 Type 2 diabetes mellitus with hyperglycemia: Secondary | ICD-10-CM

## 2013-12-02 DIAGNOSIS — G459 Transient cerebral ischemic attack, unspecified: Secondary | ICD-10-CM

## 2013-12-02 DIAGNOSIS — Z951 Presence of aortocoronary bypass graft: Secondary | ICD-10-CM

## 2013-12-02 DIAGNOSIS — I10 Essential (primary) hypertension: Secondary | ICD-10-CM

## 2013-12-02 DIAGNOSIS — G4733 Obstructive sleep apnea (adult) (pediatric): Secondary | ICD-10-CM

## 2013-12-02 MED ORDER — ATORVASTATIN CALCIUM 80 MG PO TABS: 80.0000 mg | ORAL_TABLET | Freq: Every morning | ORAL | Status: DC

## 2013-12-02 MED ORDER — AMLODIPINE BESYLATE 10 MG PO TABS: 10.0000 mg | ORAL_TABLET | Freq: Every morning | ORAL | Status: DC

## 2013-12-02 MED ORDER — FUROSEMIDE 40 MG PO TABS: ORAL_TABLET | ORAL | Status: DC

## 2013-12-02 MED ORDER — DOXAZOSIN MESYLATE 8 MG PO TABS: 8.0000 mg | ORAL_TABLET | Freq: Every day | ORAL | Status: DC

## 2013-12-02 MED ORDER — ISOSORBIDE MONONITRATE ER 60 MG PO TB24: ORAL_TABLET | ORAL | Status: DC

## 2013-12-02 MED ORDER — NITROGLYCERIN 0.4 MG SL SUBL: 0.4000 mg | SUBLINGUAL_TABLET | SUBLINGUAL | Status: DC | PRN

## 2013-12-02 MED ORDER — CARVEDILOL 12.5 MG PO TABS: 18.7500 mg | ORAL_TABLET | Freq: Two times a day (BID) | ORAL | Status: DC

## 2013-12-02 MED ORDER — CLOPIDOGREL BISULFATE 75 MG PO TABS: 75.0000 mg | ORAL_TABLET | Freq: Every day | ORAL | Status: DC

## 2013-12-02 MED ORDER — HYDRALAZINE HCL 50 MG PO TABS: 50.0000 mg | ORAL_TABLET | Freq: Three times a day (TID) | ORAL | Status: DC

## 2013-12-02 MED ORDER — POTASSIUM CHLORIDE CRYS ER 20 MEQ PO TBCR: 20.0000 meq | EXTENDED_RELEASE_TABLET | Freq: Two times a day (BID) | ORAL | Status: DC

## 2013-12-02 NOTE — Assessment & Plan Note (Signed)
Severe CAD, prior CABG, occluded grafts prior catheterization. Currently with no symptoms of angina. We'll continue his current medication regimen

## 2013-12-02 NOTE — Assessment & Plan Note (Signed)
Euvolemic on today's visit. We'll continue on Lasix 60 mg twice a day. Baseline weight appears to be in the low 190 range. Encouraged him to stay on his potassium.

## 2013-12-02 NOTE — Assessment & Plan Note (Signed)
We discussed his diet with him. Dietary guide given to him and his son. Significant dietary noncompliance Recommended strict control given his underlying severe coronary artery disease

## 2013-12-02 NOTE — Assessment & Plan Note (Signed)
Blood pressure is well controlled on today's visit. No changes made to the medications. 

## 2013-12-02 NOTE — Assessment & Plan Note (Signed)
He denies any TIA symptoms. We'll continue on aspirin and Plavix

## 2013-12-02 NOTE — Assessment & Plan Note (Signed)
Baseline creatinine appears in the low 2 range. We'll check a basic metabolic panel on his next visit

## 2013-12-02 NOTE — Assessment & Plan Note (Signed)
He is not tolerating his sleep apnea. Followed by pulmonary

## 2013-12-02 NOTE — Assessment & Plan Note (Signed)
Catheterization last year showing occluded grafts. Stable at this time, no anginal symptoms

## 2013-12-02 NOTE — Patient Instructions (Signed)
You are doing well. No medication changes were made.  Please call us if you have new issues that need to be addressed before your next appt.  Your physician wants you to follow-up in: 6 months.  You will receive a reminder letter in the mail two months in advance. If you don't receive a letter, please call our office to schedule the follow-up appointment.   

## 2013-12-02 NOTE — Progress Notes (Signed)
Patient ID: Joshua Vincent, male    DOB: October 19, 1934, 79 y.o.   MRN: [id number]  HPI Comments: [name] is a 79 y.o. male with a hx of diastolic CHF, CAD, CABG, s/p NSTEMI in 12/10 accompanied by pulmonary edema and peri-catheterization CVA,  Presents to establish care in the [place] office.  In follow-up today, he reports that he is doing well. Denies any significant shortness of breath with exertion. Family does most of his medications as he was previously noncompliant. Diabetes has been a major issue, dietary noncompliance. Eats what he wants. Recently eating doughnuts. Currently lives at [facility] [place] good food there". Denies any leg edema, no PND or orthopnea. Overall he has no complaints. 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 Has dry skin around that area We did discuss his see Pap. He is seen by pulmonary. He is unable to tolerate his see Pap and reports that he sleeps fine without it. Weight is around his baseline No significant hematuria on today's visit. This was noted previously  EKG on today's visit shows normal sinus rhythm with right bundle-branch block   Other past medical history admitted in 8/13 with NSTEMI. LHC showed severe disease in the RCA and occlusion of the LAD and LCX. SVG-Dx and SVG-RCA were occluded. SVG-OM and LIMA-LAD were patent. EF was 45% by LV-gram but echo done in 9/13 showed EF 55-60% .   He was admitted in 6/14 with NSTEMI (TnI to 12) and acute systolic CHF. EF was 15% by echo (newly decreased). He was begun on milrinone and IV Lasix with good diuresis. LHC showed stable anatomy with patent LIMA-LAD and SVG-OM. There was severe diffuse disease in small PDA and small PLV. No intervention. He was also admitted in 8/14 with transient aphasia. MRI did not show acute infarct. Suspected TIA secondary to small vessel disease. Echo in 8/14 showed surprising EF recovery to 50-55%.    Admitted 11/4 for A/C combined CHF. EF was 45-50% by echo. He was seen in f/u at the [facility] 11/30/12. He was to be set up to see Para-medicine.   Studies: - LHC (06/2012): Proximal LAD occluded, proximal circumflex occluded, mid RCA 30%, distal RCA 30%, PDA 90%, PL branch 1 proximal 95%, LIMA-LAD patent, SVG-OM patent (SVG-RCA and SVG-diagonal known to be occluded) - Echo (11/2012): Mild LVH, EF 45-50%, diffuse HK, grade 2 diastolic dysfunction, mild MR, moderate LAE, PASP 42 mm Hg - Carotid US (08/2012): Bilateral ICA <39%  Recent Labs: 10/22/2012: ALT 17; HDL Cholesterol by NMR 31.00*; LDL (calc) 86 11/09/2012: Pro B Natriuretic peptide (BNP) 29103.0* 06/03/2013: Creatinine 2.10*; Hemoglobin 11.2*; Potassium 3.6* 06/09/2013: Hgb 11.7, Cr 2.03, K 3.5, ALT 27, LDL 87, TSH 5   Wt Readings from Last 3 Encounters: 07/20/13 193 lb (87.544 kg) 06/03/13 197 lb (89.359 kg) 06/03/13 197 lb (89.359 kg)    Past Medical History . Benign prostatic hypertrophy  . Diabetes mellitus, type 2  . PVC's (premature ventricular contractions)    3 week event monitor 8/11: no signifiant arrhythmias, occ PVCs . CKD (chronic kidney disease), stage IV    a. Baseline Cr 1.4. b. AKI 06/2012 with Cr at discharge around 2.2. . Bladder cancer  . Hypertension  . Diabetic peripheral neuropathy  . History of CVA (cerebrovascular accident) NO RESIDUALS   peri-cardiac cath 12-16-2008-- right dorsal midbrain embolic infarc-- carotid dopplers with no sig. disease (double vision resolved) . History of kidney stones  .  OA (osteoarthritis)    right shoulder . History of TIA (transient ischemic attack) NO RESIDUAL   08-18-2012-- SMALL VESSEL DISEASE . HLD (hyperlipidemia)  . History of urinary retention  . GERD (gastroesophageal reflux disease)  . RBBB (right bundle branch block)  . CAD (coronary artery disease) CARDIOLOGIST- DR  MCLEAN   a. 4v CABG (1/11): LIMA LAD, SVG-D, SVG-OM, SVG-PLV. b. NSTEMI 08/2011 cath revealed severe native CAD, patent SVG to OM & LIMA to LAD, occluded SVG to RCA and SVG to Diagonal, EF 40%, no culprit lesion for PCI, med therapy - f/u EF 55-60%. c. NSTEMI 06/2012 - stable anatomy by cath, med rx recommended. Marland Kitchen History of non-ST elevation myocardial infarction (NSTEMI)    12/2008 NSTEMI W/ PULMONARY EDEMA & PERICATHETERIZATION CVA( S/P CABG)/ 08-19-2011 -- type 2 NSTEMI ([place]) & 06-30-2012-- NSTEMI & ACUTE SYSTOLIC CHF (EF 93% PER ECHO) MED. THERAPY . COPD (chronic obstructive pulmonary disease)  . Systolic and diastolic CHF, chronic    a. EF 40% by cath 08/2011; EF 55-60% by echo 09/2011. b. EF drop to 15% by echo 06/2012; c. 11/2012 Echo: EF 45-50%, diff HK, Gr 2 DD, mild MR, mod dil LA, PASP 42 . Frequency of urination  . OSA (obstructive sleep apnea)    PULMOLOGIST- DR CLANCE-- PT NON-COMPLIANT CPAP BUT STATETS IS USING OXYGEN AT NIGHT VIA  . History of glaucoma    BILATERAL LASER TX    Outpatient Encounter Prescriptions as of 12/02/2013: amLODipine (NORVASC) 10 MG tablet, Take 1 tablet (10 mg total) by mouth every morning. aspirin 81 MG tablet, Take 81 mg by mouth daily. atorvastatin (LIPITOR) 80 MG tablet, Take 1 tablet (80 mg total) by mouth every morning. carvedilol (COREG) 12.5 MG tablet, Take 1.5 tablets (18.75 mg total) by mouth 2 (two) times daily. 1 and 1/2 tablets twice a day clopidogrel (PLAVIX) 75 MG tablet, Take 1 tablet (75 mg total) by mouth daily with breakfast. doxazosin (CARDURA) 8 MG tablet, Take 1 tablet (8 mg total) by mouth at bedtime. furosemide (LASIX) 40 MG tablet, TAKE 1 AND 1/2 TABS = 60 MG TWICE DAILY DAILY hydrALAZINE (APRESOLINE) 50 MG tablet, Take 1 tablet (50 mg total) by mouth 3 (three) times daily. Insulin Glargine (LANTUS SOLOSTAR [place]), Inject 27 Units into the skin every evening.  isosorbide  mononitrate (IMDUR) 60 MG 24 hr tablet, One pill in the AM, 1/2 at bedtime daily nitroGLYCERIN (NITROSTAT) 0.4 MG SL tablet, Place 1 tablet (0.4 mg total) under the tongue every 5 (five) minutes as needed for chest pain. potassium chloride SA (K-DUR,KLOR-CON) 20 MEQ tablet, Take 1 tablet (20 mEq total) by mouth 2 (two) times daily. vitamin C (ASCORBIC ACID) 500 MG tablet, Take 500 mg by mouth daily.  Allergies: Niacin   Social History:  quit smoking about 23 years ago. His smoking use included Cigarettes. He has a 40 pack-year smoking history. He has never used smokeless tobacco. He reports that he drinks alcohol. He reports that he does not use illicit drugs.   Family History:   Aneurysm in his brother and father; Heart disease in his sister; Osteoarthritis in his sister.    Outpatient Encounter Prescriptions as of 12/02/2013  Medication Sig  . amLODipine (NORVASC) 10 MG tablet Take 1 tablet (10 mg total) by mouth every morning.  Marland Kitchen aspirin 81 MG tablet Take 81 mg by mouth daily.  Marland Kitchen atorvastatin (LIPITOR) 80 MG tablet Take 1 tablet (80 mg total) by mouth every morning.  . carvedilol (COREG) 12.5  MG tablet Take 1.5 tablets (18.75 mg total) by mouth 2 (two) times daily. 1 and 1/2 tablets twice a day  . clopidogrel (PLAVIX) 75 MG tablet Take 1 tablet (75 mg total) by mouth daily with breakfast.  . doxazosin (CARDURA) 8 MG tablet Take 1 tablet (8 mg total) by mouth at bedtime.  . furosemide (LASIX) 40 MG tablet TAKE 1 AND 1/2 TABS = 60 MG TWICE DAILY DAILY  . hydrALAZINE (APRESOLINE) 50 MG tablet Take 1 tablet (50 mg total) by mouth 3 (three) times daily.  . Insulin Glargine (LANTUS SOLOSTAR [place]) Inject 27 Units into the skin every evening.   . isosorbide mononitrate (IMDUR) 60 MG 24 hr tablet One pill in the AM, 1/2 at bedtime daily  . nitroGLYCERIN (NITROSTAT) 0.4 MG SL tablet Place 1 tablet (0.4 mg total) under the tongue every 5 (five) minutes as needed for chest pain.  . potassium  chloride SA (K-DUR,KLOR-CON) 20 MEQ tablet Take 1 tablet (20 mEq total) by mouth 2 (two) times daily.  . vitamin C (ASCORBIC ACID) 500 MG tablet Take 500 mg by mouth daily.      Review of Systems  Constitutional: Negative.   Respiratory: Negative.   Cardiovascular: Negative.   Gastrointestinal: Negative.   Skin: Negative.   Neurological: Negative.   Hematological: Negative.   Psychiatric/Behavioral: Negative.   All other systems reviewed and are negative.   BP 108/62 mmHg  Pulse 64  Ht 5\' 11"  (1.803 m)  Wt 192 lb (87.091 kg)  BMI 26.79 kg/m2  Physical Exam  Constitutional: He is oriented to person, place, and time. He appears well-developed and well-nourished.  HENT:  Head: Normocephalic.  Nose: Nose normal.  Mouth/Throat: Oropharynx is clear and moist.  Eyes: Conjunctivae are normal. Pupils are equal, round, and reactive to light.  Neck: Normal range of motion. Neck supple. No JVD present.  Cardiovascular: Normal rate, regular rhythm, S1 normal, S2 normal, normal heart sounds and intact distal pulses.  Exam reveals no gallop and no friction rub.   No murmur heard. Pulmonary/Chest: Effort normal and breath sounds normal. No respiratory distress. He has no wheezes. He has no rales. He exhibits no tenderness.  Abdominal: Soft. Bowel sounds are normal. He exhibits no distension. There is no tenderness.  Musculoskeletal: Normal range of motion. He exhibits no edema or tenderness.  Lymphadenopathy:    He has no cervical adenopathy.  Neurological: He is alert and oriented to person, place, and time. Coordination normal.  Skin: Skin is warm and dry. No rash noted. No erythema.  Psychiatric: He has a normal mood and affect. His behavior is normal. Judgment and thought content normal.      Assessment and Plan   Nursing note and vitals reviewed.

## 2013-12-14 ENCOUNTER — Telehealth: Payer: Self-pay | Admitting: *Deleted

## 2013-12-14 NOTE — Telephone Encounter (Signed)
Okay to travel Would use the transportation in the terminal Assistance getting in his seat

## 2013-12-14 NOTE — Telephone Encounter (Signed)
Spoke w/ Melissa.  Advised her of Dr. Donivan Scull recommendation.  She verbalizes understanding and is appreciative.

## 2013-12-14 NOTE — Telephone Encounter (Signed)
Patient's daughter called regarding if he fly across country via airplane.

## 2013-12-17 ENCOUNTER — Encounter (HOSPITAL_COMMUNITY): Payer: Self-pay | Admitting: Cardiovascular Disease

## 2014-06-10 ENCOUNTER — Encounter: Payer: Self-pay | Admitting: Cardiovascular Disease

## 2014-06-10 ENCOUNTER — Other Ambulatory Visit: Payer: Self-pay | Admitting: Cardiovascular Disease

## 2014-06-10 ENCOUNTER — Ambulatory Visit (INDEPENDENT_AMBULATORY_CARE_PROVIDER_SITE_OTHER): Payer: Medicare Other | Admitting: Cardiovascular Disease

## 2014-06-10 VITALS — BP 150/72 | HR 67 | Ht 72.0 in | Wt 196.5 lb

## 2014-06-10 DIAGNOSIS — I214 Non-ST elevation (NSTEMI) myocardial infarction: Secondary | ICD-10-CM

## 2014-06-10 DIAGNOSIS — I10 Essential (primary) hypertension: Secondary | ICD-10-CM

## 2014-06-10 DIAGNOSIS — I6350 Cerebral infarction due to unspecified occlusion or stenosis of unspecified cerebral artery: Secondary | ICD-10-CM

## 2014-06-10 DIAGNOSIS — I5032 Chronic diastolic (congestive) heart failure: Secondary | ICD-10-CM

## 2014-06-10 DIAGNOSIS — E1165 Type 2 diabetes mellitus with hyperglycemia: Secondary | ICD-10-CM

## 2014-06-10 DIAGNOSIS — I639 Cerebral infarction, unspecified: Secondary | ICD-10-CM

## 2014-06-10 DIAGNOSIS — I739 Peripheral vascular disease, unspecified: Secondary | ICD-10-CM

## 2014-06-10 DIAGNOSIS — Z951 Presence of aortocoronary bypass graft: Secondary | ICD-10-CM

## 2014-06-10 DIAGNOSIS — E785 Hyperlipidemia, unspecified: Secondary | ICD-10-CM

## 2014-06-10 DIAGNOSIS — E118 Type 2 diabetes mellitus with unspecified complications: Secondary | ICD-10-CM

## 2014-06-10 DIAGNOSIS — R29898 Other symptoms and signs involving the musculoskeletal system: Secondary | ICD-10-CM

## 2014-06-10 DIAGNOSIS — IMO0002 Reserved for concepts with insufficient information to code with codable children: Secondary | ICD-10-CM

## 2014-06-10 NOTE — Assessment & Plan Note (Signed)
Most of his visit today spent discussing his diet and need to restrict his food intake. We have encouraged continued exercise, careful diet management in an effort to lose weight.

## 2014-06-10 NOTE — Patient Instructions (Addendum)
You are doing well. No medication changes were made.  We will order ABIs/lower extremity doppler for leg weakness/claudication  Please call us if you have new issues that need to be addressed before your next appt.  Your physician wants you to follow-up in: 6 months.  You will receive a reminder letter in the mail two months in advance. If you don't receive a letter, please call our office to schedule the follow-up appointment.

## 2014-06-10 NOTE — Progress Notes (Signed)
Patient ID: Joshua Vincent, male    DOB: 02-02-1934, 80 y.o.   MRN: [id number]  HPI Comments: Joshua Vincent is a 80 y.o. male with a hx of diastolic CHF, CAD, CABG, s/p NSTEMI in 12/10 accompanied by pulmonary edema and peri-catheterization CVA,  He presents today for follow-up of his coronary artery disease. Daughter presents with him He currently lives at [facility] History of obstructive sleep apnea, unable to tolerate his CPAP Baseline creatinine greater than 2  In follow-up today, he reports that he is doing well. He reports his legs are giving out. Some leg weakness, cramping. Daughter reports that he does not walk very far before giving out. Primary care physician was concerned about PAD. He denies any significant chest pain with exertion. He does have some chronic mild shortness of breath. Daughter reports that creatinine has climbed up to 2.8. He continues on high dose Lasix 60 mg twice a day with potassium twice a day. Recent lab works to primary care Daughter is concerned as diabetes is not well controlled. He eats what he wants with no restrictions  EKG on today's visit shows normal sinus rhythm with rate 67 bpm, right bundle branch block, consider old anterior MI  Other past medical history admitted in 8/13 with NSTEMI. LHC showed severe disease in the RCA and occlusion of the LAD and LCX. SVG-Dx and SVG-RCA were occluded. SVG-OM and LIMA-LAD were patent. EF was 45% by LV-gram but echo done in 9/13 showed EF 55-60% .   He was admitted in 6/14 with NSTEMI (TnI to 12) and acute systolic CHF. EF was 15% by echo (newly decreased). He was begun on milrinone and IV Lasix with good diuresis. LHC showed stable anatomy with patent LIMA-LAD and SVG-OM. There was severe diffuse disease in small PDA and small PLV. No intervention. He was also admitted in 8/14 with transient aphasia. MRI did not show acute infarct. Suspected TIA secondary to small vessel disease. Echo in 8/14 showed surprising  EF recovery to 50-55%.   Admitted 11/4 for A/C combined CHF. EF was 45-50% by echo. He was seen in f/u at the [facility] 11/30/12. He was to be set up to see Para-medicine.   Studies: - LHC (06/2012): Proximal LAD occluded, proximal circumflex occluded, mid RCA 30%, distal RCA 30%, PDA 90%, PL branch 1 proximal 95%, LIMA-LAD patent, SVG-OM patent (SVG-RCA and SVG-diagonal known to be occluded) - Echo (11/2012): Mild LVH, EF 45-50%, diffuse HK, grade 2 diastolic dysfunction, mild MR, moderate LAE, PASP 42 mm Hg - Carotid US (08/2012): Bilateral ICA <39%    Allergies  Allergen Reactions  . Niacin Itching    Current Outpatient Prescriptions on File Prior to Visit  Medication Sig Dispense Refill  . amLODipine (NORVASC) 10 MG tablet Take 1 tablet (10 mg total) by mouth every morning. 90 tablet 3  . aspirin 81 MG tablet Take 81 mg by mouth daily.    Marland Kitchen atorvastatin (LIPITOR) 80 MG tablet Take 1 tablet (80 mg total) by mouth every morning. 90 tablet 3  . carvedilol (COREG) 12.5 MG tablet Take 1.5 tablets (18.75 mg total) by mouth 2 (two) times daily. 1 and 1/2 tablets twice a day 270 tablet 3  . Cholecalciferol (VITAMIN D3) 1000 UNITS CAPS Take 1 capsule by mouth daily.    . clopidogrel (PLAVIX) 75 MG tablet Take 1 tablet (75 mg total) by mouth daily with breakfast. 90 tablet 3  . doxazosin (CARDURA) 8 MG tablet Take 1 tablet (  8 mg total) by mouth at bedtime. 90 tablet 3  . furosemide (LASIX) 40 MG tablet TAKE 1 AND 1/2 TABS = 60 MG TWICE DAILY DAILY 270 tablet 3  . hydrALAZINE (APRESOLINE) 50 MG tablet Take 1 tablet (50 mg total) by mouth 3 (three) times daily. 270 tablet 3  . Insulin Glargine (LANTUS SOLOSTAR [place]) Inject 35 Units into the skin every evening.     . isosorbide mononitrate (IMDUR) 60 MG 24 hr tablet One pill in the AM, 1/2 at bedtime daily 135 tablet 3  . nitroGLYCERIN (NITROSTAT) 0.4 MG SL tablet Place 1 tablet (0.4 mg total) under the tongue every 5 (five) minutes as  needed for chest pain. 25 tablet 6  . potassium chloride SA (K-DUR,KLOR-CON) 20 MEQ tablet Take 1 tablet (20 mEq total) by mouth 2 (two) times daily. 180 tablet 3  . vitamin C (ASCORBIC ACID) 500 MG tablet Take 500 mg by mouth daily.     No current facility-administered medications on file prior to visit.    Past Medical History  Diagnosis Date  . Benign prostatic hypertrophy   . Diabetes mellitus, type 2   . PVC's (premature ventricular contractions)     3 week  event monitor 8/11: no signifiant arrhythmias, occ PVCs  . Bladder cancer   . Hypertension   . Diabetic peripheral neuropathy   . History of CVA (cerebrovascular accident) NO RESIDUALS    peri-cardiac cath 12-16-2008--  right dorsal midbrain embolic infarc--  carotid dopplers with no sig. disease (double vision resolved)  . History of kidney stones   . OA (osteoarthritis)     right shoulder  . History of TIA (transient ischemic attack) NO RESIDUAL    08-18-2012-- SMALL VESSEL DISEASE  . HLD (hyperlipidemia)   . History of urinary retention   . GERD (gastroesophageal reflux disease)   . RBBB (right bundle branch block)   . CAD (coronary artery disease) CARDIOLOGIST-  DR MCLEAN    a. 4v CABG (1/11): LIMA LAD, SVG-D, SVG-OM, SVG-PLV. b. NSTEMI 08/2011 cath  revealed severe native CAD, patent SVG to OM & LIMA to LAD, occluded SVG to RCA and SVG to Diagonal, EF 40%, no culprit lesion for PCI, med therapy - f/u EF 55-60%. c. NSTEMI 06/2012 - stable anatomy by cath, med rx recommended.  Marland Kitchen History of non-ST elevation myocardial infarction (NSTEMI)     12/2008  NSTEMI W/ PULMONARY EDEMA &  PERICATHETERIZATION CVA( S/P CABG)/   08-19-2011 --  type 2 NSTEMI ([place])  &  06-30-2012--  NSTEMI &  ACUTE SYSTOLIC CHF (EF 18% PER ECHO)  MED. THERAPY  . COPD (chronic obstructive pulmonary disease)   . Systolic and diastolic CHF, chronic     a. EF 40% by cath 08/2011; EF 55-60% by echo 09/2011. b. EF drop to 15% by echo 06/2012;  c. 11/2012  Echo: EF 45-50%, diff HK, Gr 2 DD, mild MR, mod dil LA, PASP 42  . Frequency of urination   . OSA (obstructive sleep apnea)     PULMOLOGIST-  DR CLANCE--  PT NON-COMPLIANT CPAP BUT STATETS IS USING OXYGEN AT NIGHT VIA [place]  . History of glaucoma     BILATERAL LASER TX  . CKD (chronic kidney disease), stage IV     a. Baseline Cr 1.4. b. AKI 06/2012 with Cr at discharge around 2.2.    Past Surgical History  Procedure Laterality Date  . Appendectomy    . Transurethral resection of prostate  1998  .  Retinal detachment surgery  2010  . Inguinal hernia repair Left 02-17-2010  . Cataract extraction w/ intraocular lens  implant, bilateral  2010  . Cysto/  balloon dilation urethral stricture/  resection bladder tumor/ fulgeration/ instillation mitomycin  07-13-2009  . Transthoracic echocardiogram  11-10-2012    MILD LVH/  EF 45-50%/  DIFFUSE/ HYPOKINESIS/ GRADE II DIASTOLIC DYSFUNCTION/  MILD MR/  MODERATE LAE/  MILD INCREASE SYSTOLIC PRESSURE  . Coronary artery bypass graft  02-07-2009  dr Lawson Fiscal    X4/   LIMA to LAD/  SVA to DIAG./  SVG to OM/  SVG to PLV  . Cardiac catheterization  12-26-2008  dr cooper    LAD 90-95%/  CFX 80%/  RCA 50-60%/  DIAG. LAD OCCLUDED  . Cardiac catheterization  08-19-2011   dr Cathie Olden    (NSTEMI)  SEVERE NATIVE CAD/  PATENT SVG to OM & LIMA to LAD/  OCCLUDED SVG to RCA & SVG to DIAG/   EF 40%/  NO CULPRIT LESION FOR PCI/  MED THERAPY  . Cardiac catheterization  07-07-2012  dr Martinique    NO SIGNIFICANT CHANGE COMPARED TO PRIOR CARDIAC CATH/  SEVERE 3V  CAD/  PATENT LIMA to LAD  &  SVG to OM (NSTEMI)  . Cystoscopy with biopsy N/A 06/03/2013    Procedure: CYSTOSCOPY WITH COLD CUP RESECTION/MITOMYCIN INSTILLATION in PACU;  Surgeon: Bernestine Amass, MD;  Location: [facility];  Service: Urology;  Laterality: N/A;  . Left heart catheterization with coronary angiogram N/A 08/20/2011    Procedure: LEFT HEART CATHETERIZATION WITH CORONARY ANGIOGRAM;  Surgeon:  Thayer Headings, MD;  Location: [facility] CATH LAB;  Service: Cardiovascular;  Laterality: N/A;  . Coronary angiogram  07/07/2012    Procedure: CORONARY ANGIOGRAM;  Surgeon: Peter M Martinique, MD;  Location: [facility] CATH LAB;  Service: Cardiovascular;;  . Graft(s) angiogram  07/07/2012    Procedure: GRAFT(S) Cyril Loosen;  Surgeon: Peter M Martinique, MD;  Location: [facility] CATH LAB;  Service: Cardiovascular;;    Social History  reports that he quit smoking about 24 years ago. His smoking use included Cigarettes. He has a 40 pack-year smoking history. He has never used smokeless tobacco. He reports that he drinks alcohol. He reports that he does not use illicit drugs.  Family History family history includes Aneurysm in his brother and father; Heart disease in his sister; Osteoarthritis in his sister.   Review of Systems  Respiratory: Positive for shortness of breath.   Cardiovascular: Negative.   Gastrointestinal: Negative.   Skin: Negative.   Neurological: Positive for weakness.  Hematological: Negative.   Psychiatric/Behavioral: Negative.   All other systems reviewed and are negative.   BP 150/72 mmHg  Pulse 67  Ht 6' (1.829 m)  Wt 196 lb 8 oz (89.132 kg)  BMI 26.64 kg/m2  Physical Exam  Constitutional: He is oriented to person, place, and time. He appears well-developed and well-nourished.  HENT:  Head: Normocephalic.  Nose: Nose normal.  Mouth/Throat: Oropharynx is clear and moist.  Eyes: Conjunctivae are normal. Pupils are equal, round, and reactive to light.  Neck: Normal range of motion. Neck supple. No JVD present.  Cardiovascular: Normal rate, regular rhythm, S1 normal, S2 normal, normal heart sounds and intact distal pulses.  Exam reveals no gallop and no friction rub.   No murmur heard. Pulmonary/Chest: Effort normal and breath sounds normal. No respiratory distress. He has no wheezes. He has no rales. He exhibits no tenderness.  Abdominal: Soft. Bowel sounds are normal.  He exhibits no  distension. There is no tenderness.  Musculoskeletal: Normal range of motion. He exhibits no edema or tenderness.  Lymphadenopathy:    He has no cervical adenopathy.  Neurological: He is alert and oriented to person, place, and time. Coordination normal.  Skin: Skin is warm and dry. No rash noted. No erythema.  Psychiatric: He has a normal mood and affect. His behavior is normal. Judgment and thought content normal.      Assessment and Plan   Nursing note and vitals reviewed.

## 2014-06-10 NOTE — Assessment & Plan Note (Signed)
Euvolemic on today's visit. Given the rise in his creatinine, I'm concerned about mild dehydration. Suggested they try to skip some doses in the afternoon They are scheduling new patient evaluation with nephrology in [place]

## 2014-06-10 NOTE — Assessment & Plan Note (Signed)
Primary care is drawing fasting labs. Goal LDL less than 70

## 2014-06-10 NOTE — Assessment & Plan Note (Signed)
Currently with no symptoms concerning for angina

## 2014-06-10 NOTE — Assessment & Plan Note (Signed)
No significant pulses appreciated on exam. He has symptoms concerning for claudication. Also with leg weakness. ABIs and lower extremity Doppler has been ordered

## 2014-06-10 NOTE — Assessment & Plan Note (Signed)
Blood pressure is well controlled on today's visit. No changes made to the medications. 

## 2014-06-16 ENCOUNTER — Other Ambulatory Visit: Payer: Self-pay | Admitting: Cardiovascular Disease

## 2014-06-16 DIAGNOSIS — I739 Peripheral vascular disease, unspecified: Secondary | ICD-10-CM

## 2014-06-29 ENCOUNTER — Ambulatory Visit (INDEPENDENT_AMBULATORY_CARE_PROVIDER_SITE_OTHER): Payer: Medicare Other

## 2014-06-29 DIAGNOSIS — I5032 Chronic diastolic (congestive) heart failure: Secondary | ICD-10-CM | POA: Diagnosis not present

## 2014-06-29 DIAGNOSIS — I739 Peripheral vascular disease, unspecified: Secondary | ICD-10-CM

## 2014-06-29 DIAGNOSIS — I639 Cerebral infarction, unspecified: Secondary | ICD-10-CM | POA: Diagnosis not present

## 2014-06-29 DIAGNOSIS — I214 Non-ST elevation (NSTEMI) myocardial infarction: Secondary | ICD-10-CM

## 2014-06-29 DIAGNOSIS — I6350 Cerebral infarction due to unspecified occlusion or stenosis of unspecified cerebral artery: Secondary | ICD-10-CM

## 2014-06-30 ENCOUNTER — Other Ambulatory Visit: Payer: Self-pay

## 2014-06-30 DIAGNOSIS — I739 Peripheral vascular disease, unspecified: Secondary | ICD-10-CM

## 2014-06-30 DIAGNOSIS — R29898 Other symptoms and signs involving the musculoskeletal system: Secondary | ICD-10-CM

## 2014-07-05 ENCOUNTER — Other Ambulatory Visit: Payer: Self-pay

## 2014-07-27 ENCOUNTER — Telehealth: Payer: Self-pay | Admitting: Cardiovascular Disease

## 2014-07-27 ENCOUNTER — Ambulatory Visit (INDEPENDENT_AMBULATORY_CARE_PROVIDER_SITE_OTHER): Payer: Medicare Other

## 2014-07-27 DIAGNOSIS — R29898 Other symptoms and signs involving the musculoskeletal system: Secondary | ICD-10-CM | POA: Diagnosis not present

## 2014-07-27 DIAGNOSIS — I739 Peripheral vascular disease, unspecified: Secondary | ICD-10-CM | POA: Diagnosis not present

## 2014-07-27 NOTE — Telephone Encounter (Signed)
Patient daughter Lenna Sciara would like to discuss with Nurse.  Please call.

## 2014-07-27 NOTE — Telephone Encounter (Signed)
I spoke with the patient's daughter.  I advised her I did receive the patient's record of his BP/ blood sugar readings that he brought in the office this morning.  BP's are running from 119/74- 187/97. His BP's are taken anywhere from 7:13 am - 12:00 pm. I asked the patient's daughter if the patient was taking his BP readings after he took his medications.  She was unsure of this.  She states that if he does not have a doctor's appointment/ somewhere he has to be, that he will sleep late, so the readings may have been taken right when he got up regardless of the time. I advised the patient's daughter to have the patient take his BP at least 30 minutes to 1 hour after he takes his medications and keep track of these readings.  They will then notify us of the readings. In regards to low blood sugars, I have advised the patient's daughter to contact the patient's PCP as he is on insulin.  She is agreeable.

## 2014-07-27 NOTE — Telephone Encounter (Signed)
Patient here for vascular scan and c/o low blood suger (50's this morning recheck currently at 89) .  Patient has also had some high blood pressures as well.  See log for this month.

## 2014-08-04 ENCOUNTER — Other Ambulatory Visit: Payer: Self-pay | Admitting: Cardiovascular Disease

## 2014-08-15 ENCOUNTER — Telehealth: Payer: Self-pay | Admitting: Cardiovascular Disease

## 2014-08-15 NOTE — Telephone Encounter (Signed)
Blood pressure numbers reviewed, patient dropped off numbers BP is running high, Could increase cardura up to 8 mg twice a day Or increase hydralazine up to 100 mg TID Would continue to monitor

## 2014-08-16 NOTE — Telephone Encounter (Signed)
Spoke w/ pt's daughter, Lenna Sciara.  She reports that pt's BP has been running 136/82, 145/87, 136/94, 127/82, 135/83.

## 2014-08-16 NOTE — Telephone Encounter (Signed)
Advised her of Dr. Donivan Scull recommendation.  She states that she does not want to increase any of pt's meds until Dr. Rockey Situ speaks w/ Dr. Rolly Salter, as pt has ESRD. She states that Dr. Rolly Salter wants to start pt on an ACE inhibitor or decrease hydralazine.  She does not want to pay for any additional meds if pt will not be able to take them.

## 2014-08-16 NOTE — Telephone Encounter (Signed)
Blood pressure numbers just provided are better than the numbers that were written down on the paper in the office he had given [place]  If they continue to run in the numbers as given today, would continue his current medication regimen

## 2014-08-16 NOTE — Telephone Encounter (Signed)
Spoke w/ Melissa.  Advised her of Dr. Donivan Scull recommendation.  She is agreeable and will have Dr. Rolly Salter call if he would like to discuss any further med changes.

## 2014-12-06 ENCOUNTER — Ambulatory Visit: Payer: Medicare Other | Admitting: Cardiovascular Disease

## 2015-01-18 ENCOUNTER — Other Ambulatory Visit: Payer: Self-pay | Admitting: Cardiovascular Disease

## 2015-02-03 ENCOUNTER — Ambulatory Visit: Payer: Medicare Other | Admitting: Cardiovascular Disease

## 2015-03-17 ENCOUNTER — Encounter: Payer: Self-pay | Admitting: Cardiovascular Disease

## 2015-03-17 ENCOUNTER — Ambulatory Visit (INDEPENDENT_AMBULATORY_CARE_PROVIDER_SITE_OTHER): Payer: Medicare Other | Admitting: Cardiovascular Disease

## 2015-03-17 VITALS — BP 110/60 | HR 73 | Ht 72.0 in | Wt 202.1 lb

## 2015-03-17 DIAGNOSIS — I169 Hypertensive crisis, unspecified: Secondary | ICD-10-CM | POA: Diagnosis not present

## 2015-03-17 DIAGNOSIS — I5032 Chronic diastolic (congestive) heart failure: Secondary | ICD-10-CM

## 2015-03-17 DIAGNOSIS — E785 Hyperlipidemia, unspecified: Secondary | ICD-10-CM

## 2015-03-17 DIAGNOSIS — I2510 Atherosclerotic heart disease of native coronary artery without angina pectoris: Secondary | ICD-10-CM | POA: Diagnosis not present

## 2015-03-17 DIAGNOSIS — E118 Type 2 diabetes mellitus with unspecified complications: Secondary | ICD-10-CM

## 2015-03-17 DIAGNOSIS — IMO0002 Reserved for concepts with insufficient information to code with codable children: Secondary | ICD-10-CM

## 2015-03-17 DIAGNOSIS — E1165 Type 2 diabetes mellitus with hyperglycemia: Secondary | ICD-10-CM

## 2015-03-17 DIAGNOSIS — Z794 Long term (current) use of insulin: Secondary | ICD-10-CM

## 2015-03-17 NOTE — Progress Notes (Signed)
Patient ID: Joshua Vincent, male    DOB: Aug 27, 1934, 81 y.o.   MRN: [id number]  HPI Comments: Joshua Vincent is a 81 y.o. male with a hx of diastolic CHF, CAD, CABG, s/p NSTEMI in 12/10 accompanied by pulmonary edema and peri-catheterization CVA,  He presents today for follow-up of his coronary artery disease. Daughter presents with him He currently lives at [facility] History of obstructive sleep apnea, unable to tolerate his CPAP Baseline creatinine greater than 2  In follow-up, patient reports that he feels well Blood pressure in January was elevated, does not have any measurements since that time He has not been checking his sugars in the afternoon, hemoglobin A1c running high 8.6 Denies having any significant leg edema Severe renal dysfunction, followed by nephrology, Dr. Abigail Butts. Daughter reports GFR is 26 or less Lasix increased up to 80 mg twice a day with potassium He reports that he makes good urine  Daughter also reports patient has significant memory loss, they are considering medications for depression, SSRI, but has not been started yet   EKG on today's visit shows normal sinus rhythm with rate 73 beats minute, right bundle branch block, left anterior fascicular block  Other past medical history admitted in 8/13 with NSTEMI. LHC showed severe disease in the RCA and occlusion of the LAD and LCX. SVG-Dx and SVG-RCA were occluded. SVG-OM and LIMA-LAD were patent. EF was 45% by LV-gram but echo done in 9/13 showed EF 55-60% .   He was admitted in 6/14 with NSTEMI (TnI to 12) and acute systolic CHF. EF was 15% by echo (newly decreased). He was begun on milrinone and IV Lasix with good diuresis. LHC showed stable anatomy with patent LIMA-LAD and SVG-OM. There was severe diffuse disease in small PDA and small PLV. No intervention. He was also admitted in 8/14 with transient aphasia. MRI did not show acute infarct. Suspected TIA secondary to small vessel disease. Echo in 8/14 showed  surprising EF recovery to 50-55%.   Admitted 11/4 for A/C combined CHF. EF was 45-50% by echo. He was seen in f/u at the [facility] 11/30/12. He was to be set up to see Para-medicine.   Studies: - LHC (06/2012): Proximal LAD occluded, proximal circumflex occluded, mid RCA 30%, distal RCA 30%, PDA 90%, PL branch 1 proximal 95%, LIMA-LAD patent, SVG-OM patent (SVG-RCA and SVG-diagonal known to be occluded) - Echo (11/2012): Mild LVH, EF 45-50%, diffuse HK, grade 2 diastolic dysfunction, mild MR, moderate LAE, PASP 42 mm Hg - Carotid US (08/2012): Bilateral ICA <39%    Allergies  Allergen Reactions  . Niacin Itching    Current Outpatient Prescriptions on File Prior to Visit  Medication Sig Dispense Refill  . amLODipine (NORVASC) 10 MG tablet TAKE 1 TABLET (10 MG TOTAL) BY MOUTH EVERY MORNING. 90 tablet 3  . aspirin 81 MG tablet Take 81 mg by mouth daily.    Marland Kitchen atorvastatin (LIPITOR) 80 MG tablet Take 1 tablet (80 mg total) by mouth every morning. 90 tablet 3  . carvedilol (COREG) 12.5 MG tablet TAKE 1 AND 1/2 TABLETS BY MOUTH 2 (TWO) TIMES DAILY. 270 tablet 3  . Cholecalciferol (VITAMIN D3) 1000 UNITS CAPS Take 1 capsule by mouth daily.    . clopidogrel (PLAVIX) 75 MG tablet TAKE 1 TABLET (75 MG TOTAL) BY MOUTH DAILY WITH BREAKFAST. 90 tablet 3  . doxazosin (CARDURA) 8 MG tablet TAKE 1 TABLET (8 MG TOTAL) BY MOUTH AT BEDTIME. 90 tablet 3  . hydrALAZINE (  APRESOLINE) 50 MG tablet TAKE 1 TABLET (50 MG TOTAL) BY MOUTH 3 (THREE) TIMES DAILY. 270 tablet 3  . Insulin Glargine (LANTUS SOLOSTAR [place]) Inject 35 Units into the skin every evening.     . insulin lispro (HUMALOG) 100 UNIT/ML injection Sliding scale at meal time.    . isosorbide mononitrate (IMDUR) 60 MG 24 hr tablet TAKE ONE PILL IN THE MORNING AND, 1/2 TABLET AT BEDTIME DAILY 135 tablet 3  . nitroGLYCERIN (NITROSTAT) 0.4 MG SL tablet Place 1 tablet (0.4 mg total) under the tongue every 5 (five) minutes as needed for chest pain. 25  tablet 6  . vitamin C (ASCORBIC ACID) 500 MG tablet Take 500 mg by mouth daily.     No current facility-administered medications on file prior to visit.    Past Medical History  Diagnosis Date  . Benign prostatic hypertrophy   . Diabetes mellitus, type 2 ([place])   . PVC's (premature ventricular contractions)     3 week  event monitor 8/11: no signifiant arrhythmias, occ PVCs  . Bladder cancer ([place])   . Hypertension   . Diabetic peripheral neuropathy ([place])   . History of CVA (cerebrovascular accident) NO RESIDUALS    peri-cardiac cath 12-16-2008--  right dorsal midbrain embolic infarc--  carotid dopplers with no sig. disease (double vision resolved)  . History of kidney stones   . OA (osteoarthritis)     right shoulder  . History of TIA (transient ischemic attack) NO RESIDUAL    08-18-2012-- SMALL VESSEL DISEASE  . HLD (hyperlipidemia)   . History of urinary retention   . GERD (gastroesophageal reflux disease)   . RBBB (right bundle branch block)   . CAD (coronary artery disease) CARDIOLOGIST-  DR MCLEAN    a. 4v CABG (1/11): LIMA LAD, SVG-D, SVG-OM, SVG-PLV. b. NSTEMI 08/2011 cath  revealed severe native CAD, patent SVG to OM & LIMA to LAD, occluded SVG to RCA and SVG to Diagonal, EF 40%, no culprit lesion for PCI, med therapy - f/u EF 55-60%. c. NSTEMI 06/2012 - stable anatomy by cath, med rx recommended.  Marland Kitchen History of non-ST elevation myocardial infarction (NSTEMI)     12/2008  NSTEMI W/ PULMONARY EDEMA &  PERICATHETERIZATION CVA( S/P CABG)/   08-19-2011 --  type 2 NSTEMI ([place])  &  06-30-2012--  NSTEMI &  ACUTE SYSTOLIC CHF (EF 0000000 PER ECHO)  MED. THERAPY  . COPD (chronic obstructive pulmonary disease) ([place])   . Systolic and diastolic CHF, chronic ([place])     a. EF 40% by cath 08/2011; EF 55-60% by echo 09/2011. b. EF drop to 15% by echo 06/2012;  c. 11/2012 Echo: EF 45-50%, diff HK, Gr 2 DD, mild MR, mod dil LA, PASP 42  . Frequency of urination   . OSA (obstructive sleep apnea)      PULMOLOGIST-  DR CLANCE--  PT NON-COMPLIANT CPAP BUT STATETS IS USING OXYGEN AT NIGHT VIA [place]  . History of glaucoma     BILATERAL LASER TX  . CKD (chronic kidney disease), stage IV ([place])     a. Baseline Cr 1.4. b. AKI 06/2012 with Cr at discharge around 2.2.    Past Surgical History  Procedure Laterality Date  . Appendectomy    . Transurethral resection of prostate  1998  . Retinal detachment surgery  2010  . Inguinal hernia repair Left 02-17-2010  . Cataract extraction w/ intraocular lens  implant, bilateral  2010  . Cysto/  balloon dilation urethral stricture/  resection bladder tumor/ fulgeration/ instillation mitomycin  07-13-2009  . Transthoracic echocardiogram  11-10-2012    MILD LVH/  EF 45-50%/  DIFFUSE/ HYPOKINESIS/ GRADE II DIASTOLIC DYSFUNCTION/  MILD MR/  MODERATE LAE/  MILD INCREASE SYSTOLIC PRESSURE  . Coronary artery bypass graft  02-07-2009  dr Lawson Fiscal    X4/   LIMA to LAD/  SVA to DIAG./  SVG to OM/  SVG to PLV  . Cardiac catheterization  12-26-2008  dr cooper    LAD 90-95%/  CFX 80%/  RCA 50-60%/  DIAG. LAD OCCLUDED  . Cardiac catheterization  08-19-2011   dr Cathie Olden    (NSTEMI)  SEVERE NATIVE CAD/  PATENT SVG to OM & LIMA to LAD/  OCCLUDED SVG to RCA & SVG to DIAG/   EF 40%/  NO CULPRIT LESION FOR PCI/  MED THERAPY  . Cardiac catheterization  07-07-2012  dr Martinique    NO SIGNIFICANT CHANGE COMPARED TO PRIOR CARDIAC CATH/  SEVERE 3V  CAD/  PATENT LIMA to LAD  &  SVG to OM (NSTEMI)  . Cystoscopy with biopsy N/A 06/03/2013    Procedure: CYSTOSCOPY WITH COLD CUP RESECTION/MITOMYCIN INSTILLATION in PACU;  Surgeon: Bernestine Amass, MD;  Location: [facility];  Service: Urology;  Laterality: N/A;  . Left heart catheterization with coronary angiogram N/A 08/20/2011    Procedure: LEFT HEART CATHETERIZATION WITH CORONARY ANGIOGRAM;  Surgeon: Thayer Headings, MD;  Location: [facility] CATH LAB;  Service: Cardiovascular;  Laterality: N/A;  . Coronary angiogram  07/07/2012     Procedure: CORONARY ANGIOGRAM;  Surgeon: Peter M Martinique, MD;  Location: [facility] CATH LAB;  Service: Cardiovascular;;  . Graft(s) angiogram  07/07/2012    Procedure: GRAFT(S) Cyril Loosen;  Surgeon: Peter M Martinique, MD;  Location: [facility] CATH LAB;  Service: Cardiovascular;;    Social History  reports that he quit smoking about 24 years ago. His smoking use included Cigarettes. He has a 40 pack-year smoking history. He has never used smokeless tobacco. He reports that he drinks alcohol. He reports that he does not use illicit drugs.  Family History family history includes Aneurysm in his brother and father; Heart disease in his sister; Osteoarthritis in his sister.   Review of Systems  Respiratory: Negative.   Cardiovascular: Negative.   Gastrointestinal: Negative.   Skin: Negative.   Neurological: Negative.   Hematological: Negative.   Psychiatric/Behavioral: Negative.   All other systems reviewed and are negative.   BP 110/60 mmHg  Pulse 73  Ht 6' (1.829 m)  Wt 202 lb 1.9 oz (91.681 kg)  BMI 27.41 kg/m2  SpO2 93%  Physical Exam  Constitutional: He is oriented to person, place, and time. He appears well-developed and well-nourished.  HENT:  Head: Normocephalic.  Nose: Nose normal.  Mouth/Throat: Oropharynx is clear and moist.  Eyes: Conjunctivae are normal. Pupils are equal, round, and reactive to light.  Neck: Normal range of motion. Neck supple. No JVD present.  Cardiovascular: Normal rate, regular rhythm, S1 normal, S2 normal, normal heart sounds and intact distal pulses.  Exam reveals no gallop and no friction rub.   No murmur heard. Pulmonary/Chest: Effort normal and breath sounds normal. No respiratory distress. He has no wheezes. He has no rales. He exhibits no tenderness.  Abdominal: Soft. Bowel sounds are normal. He exhibits no distension. There is no tenderness.  Musculoskeletal: Normal range of motion. He exhibits no edema or tenderness.  Lymphadenopathy:    He has no  cervical adenopathy.  Neurological: He is  alert and oriented to person, place, and time. Coordination normal.  Skin: Skin is warm and dry. No rash noted. No erythema.  Psychiatric: He has a normal mood and affect. His behavior is normal. Judgment and thought content normal.      Assessment and Plan   Nursing note and vitals reviewed.

## 2015-03-17 NOTE — Assessment & Plan Note (Signed)
No recent lipid panel available, previously close to goal Encouraged him to stay on his Lipitor   Total encounter time more than 25 minutes  Greater than 50% was spent in counseling and coordination of care with the patient

## 2015-03-17 NOTE — Assessment & Plan Note (Signed)
Needs to check his sugars in the afternoon and adjust insulin accordingly  We have encouraged continued exercise, careful diet management in an effort to lose weight.

## 2015-03-17 NOTE — Assessment & Plan Note (Signed)
Currently taking Lasix 80 mg twice a day Most recent lab work not available, no signs of CHF on today's visit Denies shortness of breath, will defer diuretic management to nephrology

## 2015-03-17 NOTE — Assessment & Plan Note (Signed)
Currently with no symptoms of angina. No further workup at this time. Continue current medication regimen. 

## 2015-03-17 NOTE — Assessment & Plan Note (Signed)
Blood pressure well controlled on today's visit Recommended the daughter monitor blood pressures at home and for orthostasis Patient does report occasional lightheadedness when he stands up No changes to the medications made

## 2015-03-17 NOTE — Patient Instructions (Signed)
You are doing well. No medication changes were made.  If you get dizzy when you stand up on a regular basis, Call the office  Please call us if you have new issues that need to be addressed before your next appt.  Your physician wants you to follow-up in: 6 months.  You will receive a reminder letter in the mail two months in advance. If you don't receive a letter, please call our office to schedule the follow-up appointment.

## 2015-03-24 ENCOUNTER — Other Ambulatory Visit: Payer: Self-pay | Admitting: Cardiovascular Disease

## 2015-06-02 IMAGING — CR DG CHEST 1V PORT
1 series · 1 of 1 positions shown · non-contrast
Comparison: 09/22/2011

CLINICAL DATA: Persistent cough.

PORTABLE CHEST - 1 VIEW

[AP]
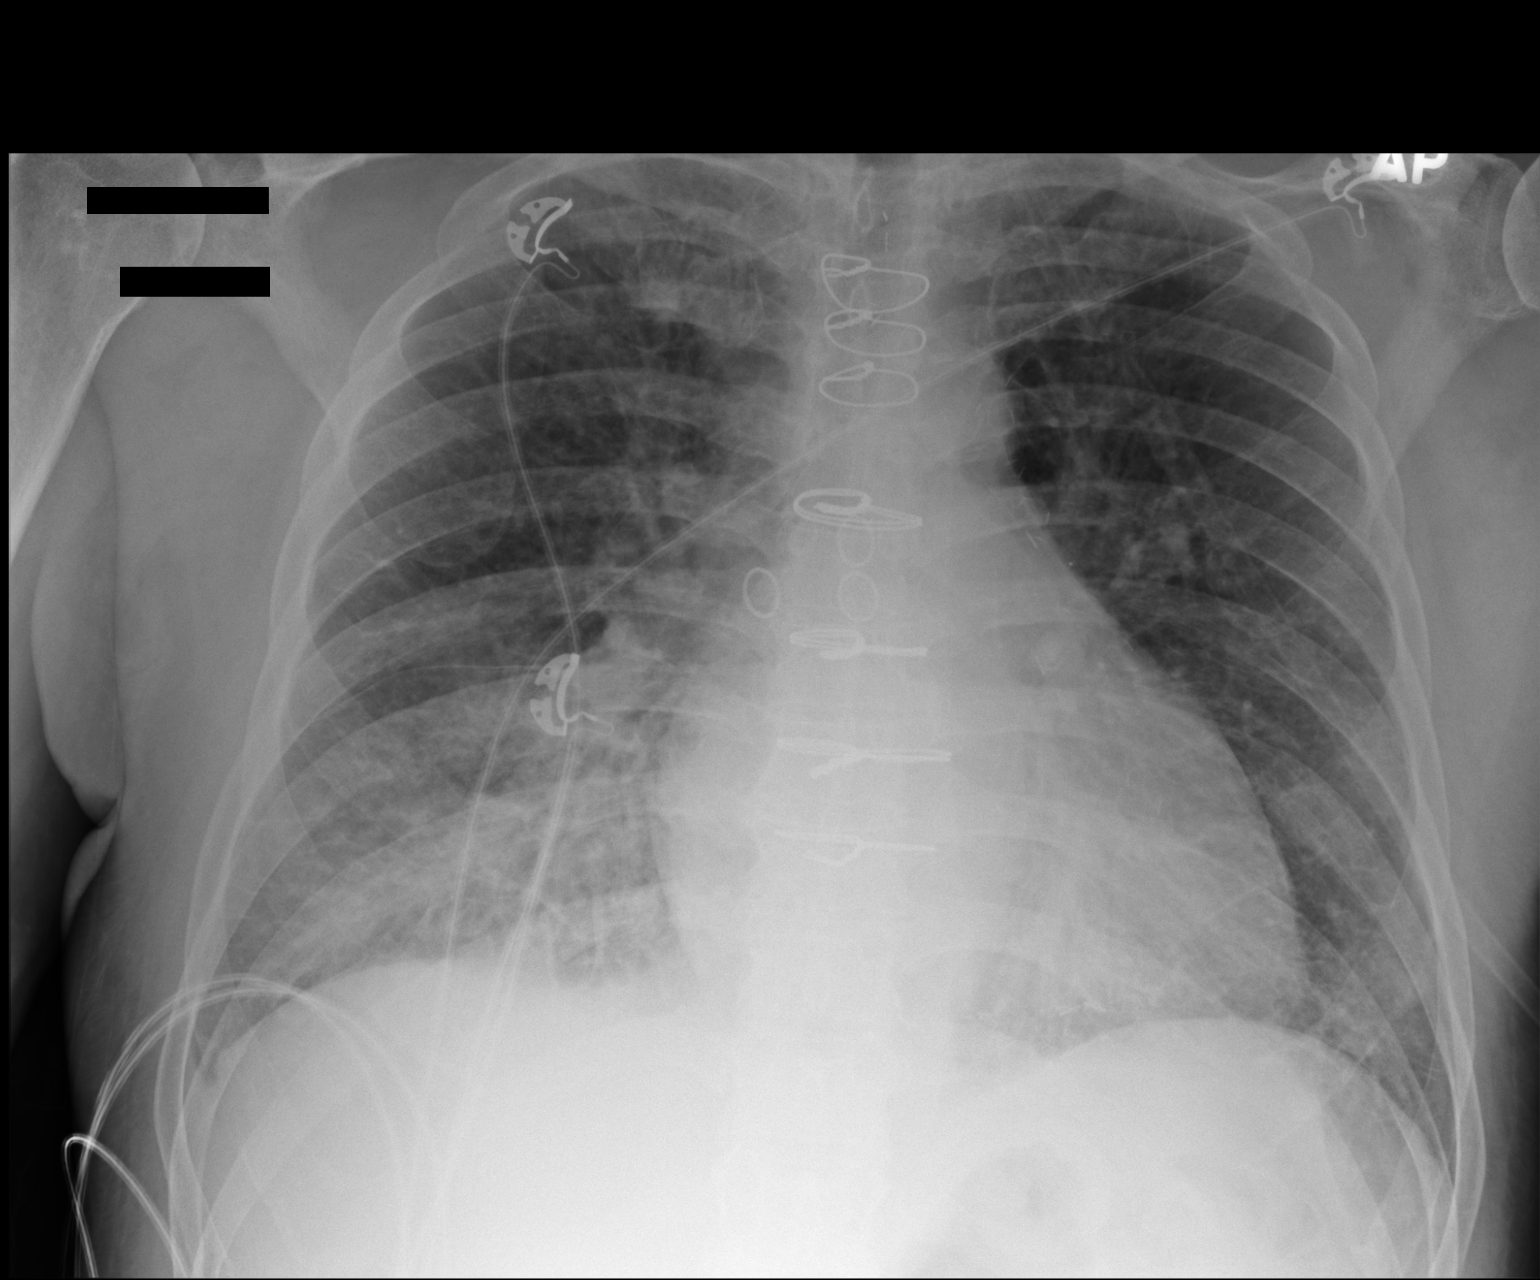

[1 of 1 positions shown; findings below may reference images not displayed]

FINDINGS: Bilateral lower lobe airspace opacities, right greater
than left.  Diffuse interstitial prominence.  Heart is borderline
in size.  Trace right pleural effusion.  Prior median sternotomy
and CABG.  No acute bony abnormality.
IMPRESSION: Bilateral lower lobe airspace opacities and interstitial
prominence.  Findings could represent asymmetric edema or
infection.

## 2015-07-20 ENCOUNTER — Other Ambulatory Visit: Payer: Self-pay | Admitting: *Deleted

## 2015-07-20 ENCOUNTER — Other Ambulatory Visit: Payer: Self-pay | Admitting: Cardiovascular Disease

## 2015-07-20 MED ORDER — POTASSIUM CHLORIDE CRYS ER 20 MEQ PO TBCR: EXTENDED_RELEASE_TABLET | ORAL | Status: DC

## 2015-07-20 NOTE — Telephone Encounter (Signed)
Requested Prescriptions   Signed Prescriptions Disp Refills  . potassium chloride SA (KLOR-CON M20) 20 MEQ tablet 180 tablet 3    Sig: TAKE 1 TABLET BY MOUTH 2 (TWO) TIMES DAILY.    Authorizing Provider: Minna Merritts    Ordering User: Britt Bottom

## 2015-08-26 ENCOUNTER — Other Ambulatory Visit: Payer: Self-pay | Admitting: *Deleted

## 2015-08-26 ENCOUNTER — Telehealth: Payer: Self-pay | Admitting: Cardiovascular Disease

## 2015-08-26 MED ORDER — AMLODIPINE BESYLATE 10 MG PO TABS: ORAL_TABLET | ORAL | 3 refills | Status: DC

## 2015-08-26 MED ORDER — NITROGLYCERIN 0.4 MG SL SUBL: 0.4000 mg | SUBLINGUAL_TABLET | SUBLINGUAL | 3 refills | Status: AC | PRN

## 2015-08-26 NOTE — Telephone Encounter (Signed)
Requested Prescriptions   Signed Prescriptions Disp Refills  . amLODipine (NORVASC) 10 MG tablet 90 tablet 3    Sig: TAKE 1 TABLET (10 MG TOTAL) BY MOUTH EVERY MORNING.    Authorizing Provider: Minna Merritts    Ordering User: Britt Bottom nitroGLYCERIN (NITROSTAT) 0.4 MG SL tablet 25 tablet 3    Sig: Place 1 tablet (0.4 mg total) under the tongue every 5 (five) minutes as needed for chest pain.    Authorizing Provider: Minna Merritts    Ordering User: Britt Bottom

## 2015-08-26 NOTE — Telephone Encounter (Signed)
°*  STAT* If patient is at the pharmacy, call can be transferred to refill team.   1. Which medications need to be refilled? (please list name of each medication and dose if known)   Amlodipine  Nitro   2. Which pharmacy/location (including street and city if local pharmacy) is medication to be sent to?  [facility] [address]   3. Do they need a 30 day or 90 day supply?  90 day

## 2015-09-05 ENCOUNTER — Other Ambulatory Visit: Payer: Self-pay | Admitting: *Deleted

## 2015-09-05 ENCOUNTER — Telehealth: Payer: Self-pay | Admitting: Cardiovascular Disease

## 2015-09-05 MED ORDER — AMLODIPINE BESYLATE 10 MG PO TABS: ORAL_TABLET | ORAL | 3 refills | Status: AC

## 2015-09-05 NOTE — Telephone Encounter (Signed)
Pt daughter in law called states pt has had elevated BP. 8/24-132/74 8/25-blood sugar dropped into the 50s, BP 97/52, Blood sugars normalized. BP went to 167/90 8/26-a.m. 166/92, afternoon 180/95 8/27-a.m. 160/80, afternoon 178/90 This morning before medication 190/98  Please call.

## 2015-09-05 NOTE — Progress Notes (Deleted)
Cardiology Office Note Date:  09/05/2015  Patient ID:  Joshua Vincent [birth year], MRN [id number] PCP:  Joshua Cha, MD  Cardiologist:  Dr. Rockey Situ, MD  ***refresh   Chief Complaint: Labile HTN  History of Present Illness: Joshua Vincent is a 81 y.o. male with history of ***   Past Medical History:  Diagnosis Date  . Benign prostatic hypertrophy   . Bladder cancer ([place])   . CAD (coronary artery disease) CARDIOLOGIST-  DR MCLEAN   a. 4v CABG (1/11): LIMA LAD, SVG-D, SVG-OM, SVG-PLV. b. NSTEMI 08/2011 cath  revealed severe native CAD, patent SVG to OM & LIMA to LAD, occluded SVG to RCA and SVG to Diagonal, EF 40%, no culprit lesion for PCI, med therapy - f/u EF 55-60%. c. NSTEMI 06/2012 - stable anatomy by cath, med rx recommended.  . CKD (chronic kidney disease), stage IV ([place])    a. Baseline Cr 1.4. b. AKI 06/2012 with Cr at discharge around 2.2.  . COPD (chronic obstructive pulmonary disease) ([place])   . Diabetes mellitus, type 2 ([place])   . Diabetic peripheral neuropathy ([place])   . Frequency of urination   . GERD (gastroesophageal reflux disease)   . History of CVA (cerebrovascular accident) NO RESIDUALS   peri-cardiac cath 12-16-2008--  right dorsal midbrain embolic infarc--  carotid dopplers with no sig. disease (double vision resolved)  . History of glaucoma    BILATERAL LASER TX  . History of kidney stones   . History of non-ST elevation myocardial infarction (NSTEMI)    12/2008  NSTEMI W/ PULMONARY EDEMA &  PERICATHETERIZATION CVA( S/P CABG)/   08-19-2011 --  type 2 NSTEMI ([place])  &  06-30-2012--  NSTEMI &  ACUTE SYSTOLIC CHF (EF 0000000 PER ECHO)  MED. THERAPY  . History of TIA (transient ischemic attack) NO RESIDUAL   08-18-2012-- SMALL VESSEL DISEASE  . History of urinary retention   . HLD (hyperlipidemia)   . Hypertension   . OA (osteoarthritis)    right shoulder  . OSA (obstructive sleep apnea)    PULMOLOGIST-  DR CLANCE--  PT NON-COMPLIANT CPAP BUT  STATETS IS USING OXYGEN AT NIGHT VIA [place]  . PVC's (premature ventricular contractions)    3 week  event monitor 8/11: no signifiant arrhythmias, occ PVCs  . RBBB (right bundle branch block)   . Systolic and diastolic CHF, chronic ([place])    a. EF 40% by cath 08/2011; EF 55-60% by echo 09/2011. b. EF drop to 15% by echo 06/2012;  c. 11/2012 Echo: EF 45-50%, diff HK, Gr 2 DD, mild MR, mod dil LA, PASP 42    Past Surgical History:  Procedure Laterality Date  . APPENDECTOMY    . CARDIAC CATHETERIZATION  12-26-2008  dr cooper   LAD 90-95%/  CFX 80%/  RCA 50-60%/  DIAG. LAD OCCLUDED  . CARDIAC CATHETERIZATION  08-19-2011   dr Cathie Olden   (NSTEMI)  SEVERE NATIVE CAD/  PATENT SVG to OM & LIMA to LAD/  OCCLUDED SVG to RCA & SVG to DIAG/   EF 40%/  NO CULPRIT LESION FOR PCI/  MED THERAPY  . CARDIAC CATHETERIZATION  07-07-2012  dr Martinique   NO SIGNIFICANT CHANGE COMPARED TO PRIOR CARDIAC CATH/  SEVERE 3V  CAD/  PATENT LIMA to LAD  &  SVG to OM (NSTEMI)  . CATARACT EXTRACTION W/ INTRAOCULAR LENS  IMPLANT, BILATERAL  2010  . CORONARY ANGIOGRAM  07/07/2012   Procedure: CORONARY ANGIOGRAM;  Surgeon: Peter M Martinique,  MD;  Location: [facility] CATH LAB;  Service: Cardiovascular;;  . CORONARY ARTERY BYPASS GRAFT  02-07-2009  dr Lawson Fiscal   X4/   LIMA to LAD/  SVA to DIAG./  SVG to OM/  SVG to PLV  . CYSTO/  BALLOON DILATION URETHRAL STRICTURE/  RESECTION BLADDER TUMOR/ FULGERATION/ INSTILLATION MITOMYCIN  07-13-2009  . CYSTOSCOPY WITH BIOPSY N/A 06/03/2013   Procedure: CYSTOSCOPY WITH COLD CUP RESECTION/MITOMYCIN INSTILLATION in PACU;  Surgeon: Bernestine Amass, MD;  Location[facility];  Service: Urology;  Laterality: N/A;  . GRAFT(S) ANGIOGRAM  07/07/2012   Procedure: GRAFT(S) ANGIOGRAM;  Surgeon: Peter M Martinique, MD;  Location: [facility] CATH LAB;  Service: Cardiovascular;;  . INGUINAL HERNIA REPAIR Left 02-17-2010  . LEFT HEART CATHETERIZATION WITH CORONARY ANGIOGRAM N/A 08/20/2011   Procedure: LEFT HEART  CATHETERIZATION WITH CORONARY ANGIOGRAM;  Surgeon: Thayer Headings, MD;  Location: [facility] CATH LAB;  Service: Cardiovascular;  Laterality: N/A;  . RETINAL DETACHMENT SURGERY  2010  . TRANSTHORACIC ECHOCARDIOGRAM  11-10-2012   MILD LVH/  EF 45-50%/  DIFFUSE/ HYPOKINESIS/ GRADE II DIASTOLIC DYSFUNCTION/  MILD MR/  MODERATE LAE/  MILD INCREASE SYSTOLIC PRESSURE  . TRANSURETHRAL RESECTION OF PROSTATE  1998    Current Outpatient Prescriptions  Medication Sig Dispense Refill  . amLODipine (NORVASC) 10 MG tablet TAKE 1 TABLET (10 MG TOTAL) BY MOUTH EVERY MORNING. 90 tablet 3  . aspirin 81 MG tablet Take 81 mg by mouth daily.    Marland Kitchen atorvastatin (LIPITOR) 80 MG tablet Take 1 tablet (80 mg total) by mouth at bedtime. 30 tablet 6  . atorvastatin (LIPITOR) 80 MG tablet TAKE 1 TABLET (80 MG TOTAL) BY MOUTH EVERY MORNING. 90 tablet 3  . carvedilol (COREG) 12.5 MG tablet TAKE 1 AND 1/2 TABLETS BY MOUTH 2 (TWO) TIMES DAILY. 270 tablet 3  . Cholecalciferol (VITAMIN D3) 1000 UNITS CAPS Take 1 capsule by mouth daily.    . clopidogrel (PLAVIX) 75 MG tablet TAKE 1 TABLET (75 MG TOTAL) BY MOUTH DAILY WITH BREAKFAST. 90 tablet 3  . diclofenac sodium (VOLTAREN) 1 % GEL Apply 2 g topically daily as needed (for pain).    Marland Kitchen doxazosin (CARDURA) 8 MG tablet TAKE 1 TABLET (8 MG TOTAL) BY MOUTH AT BEDTIME. 90 tablet 3  . furosemide (LASIX) 80 MG tablet Take 80 mg by mouth 2 (two) times daily.    . hydrALAZINE (APRESOLINE) 50 MG tablet TAKE 1 TABLET (50 MG TOTAL) BY MOUTH 3 (THREE) TIMES DAILY. 270 tablet 3  . Insulin Glargine (LANTUS SOLOSTAR [place]) Inject 35 Units into the skin every evening.     . insulin lispro (HUMALOG) 100 UNIT/ML injection Sliding scale at meal time.    . isosorbide mononitrate (IMDUR) 60 MG 24 hr tablet TAKE ONE PILL IN THE MORNING AND, 1/2 TABLET AT BEDTIME DAILY 135 tablet 3  . nitroGLYCERIN (NITROSTAT) 0.4 MG SL tablet Place 1 tablet (0.4 mg total) under the tongue every 5 (five) minutes as needed for  chest pain. 25 tablet 3  . potassium chloride SA (K-DUR,KLOR-CON) 20 MEQ tablet Take 20 mEq by mouth daily.    . potassium chloride SA (KLOR-CON M20) 20 MEQ tablet TAKE 1 TABLET BY MOUTH 2 (TWO) TIMES DAILY. 180 tablet 3  . vitamin C (ASCORBIC ACID) 500 MG tablet Take 500 mg by mouth daily.     No current facility-administered medications for this visit.     Allergies:   Niacin   Social History:  The patient  reports  that he quit smoking about 25 years ago. His smoking use included Cigarettes. He has a 40.00 pack-year smoking history. He has never used smokeless tobacco. He reports that he drinks alcohol. He reports that he does not use drugs.   Family History:  The patient's family history includes Aneurysm in his brother and father; Heart disease in his sister; Osteoarthritis in his sister.  ROS:   ROS   PHYSICAL EXAM: *** VS:  There were no vitals taken for this visit. BMI: There is no height or weight on file to calculate BMI.  Physical Exam   EKG:  Was ordered and interpreted by me today. Shows ***  Recent Labs: No results found for requested labs within last 8760 hours.  No results found for requested labs within last 8760 hours.   CrCl cannot be calculated (Unknown ideal weight.).   Wt Readings from Last 3 Encounters:  03/17/15 202 lb 1.9 oz (91.7 kg)  06/10/14 196 lb 8 oz (89.1 kg)  12/02/13 192 lb (87.1 kg)     Other studies reviewed: Additional studies/records reviewed today include: summarized above  ASSESSMENT AND PLAN:  1. Labile HTN: 2. CAD s/p CABG as above: 3. Chronic combined CHF: 4. CKD stage IV: 5. OSA: 6. HLD:  Disposition: F/u with *** in   Current medicines are reviewed at length with the patient today.  The patient did not have any concerns regarding medicines.  Melvern Banker PA-C 09/05/2015 2:59 PM     [address] [place], [place] [address] [phone]

## 2015-09-05 NOTE — Telephone Encounter (Signed)
Joshua Vincent has an opening tomorrow afternoon. Can you see if he can come in?

## 2015-09-06 ENCOUNTER — Ambulatory Visit: Payer: Self-pay | Admitting: Physician Assistant

## 2015-09-08 ENCOUNTER — Ambulatory Visit: Payer: Self-pay | Admitting: Nurse Practitioner

## 2015-09-13 ENCOUNTER — Encounter: Payer: Self-pay | Admitting: Cardiovascular Disease

## 2015-09-13 ENCOUNTER — Ambulatory Visit (INDEPENDENT_AMBULATORY_CARE_PROVIDER_SITE_OTHER): Payer: Medicare Other | Admitting: Cardiovascular Disease

## 2015-09-13 VITALS — BP 176/90 | HR 67 | Ht 72.0 in | Wt 202.0 lb

## 2015-09-13 DIAGNOSIS — E785 Hyperlipidemia, unspecified: Secondary | ICD-10-CM

## 2015-09-13 DIAGNOSIS — I2510 Atherosclerotic heart disease of native coronary artery without angina pectoris: Secondary | ICD-10-CM | POA: Diagnosis not present

## 2015-09-13 DIAGNOSIS — I10 Essential (primary) hypertension: Secondary | ICD-10-CM | POA: Diagnosis not present

## 2015-09-13 DIAGNOSIS — E1165 Type 2 diabetes mellitus with hyperglycemia: Secondary | ICD-10-CM

## 2015-09-13 DIAGNOSIS — Z951 Presence of aortocoronary bypass graft: Secondary | ICD-10-CM

## 2015-09-13 DIAGNOSIS — E118 Type 2 diabetes mellitus with unspecified complications: Secondary | ICD-10-CM

## 2015-09-13 DIAGNOSIS — I5042 Chronic combined systolic (congestive) and diastolic (congestive) heart failure: Secondary | ICD-10-CM | POA: Diagnosis not present

## 2015-09-13 DIAGNOSIS — IMO0002 Reserved for concepts with insufficient information to code with codable children: Secondary | ICD-10-CM

## 2015-09-13 DIAGNOSIS — Z794 Long term (current) use of insulin: Secondary | ICD-10-CM

## 2015-09-13 MED ORDER — HYDRALAZINE HCL 100 MG PO TABS: 100.0000 mg | ORAL_TABLET | Freq: Three times a day (TID) | ORAL | 3 refills | Status: AC

## 2015-09-13 MED ORDER — ISOSORBIDE MONONITRATE ER 60 MG PO TB24: 60.0000 mg | ORAL_TABLET | Freq: Two times a day (BID) | ORAL | 3 refills | Status: AC

## 2015-09-13 NOTE — Progress Notes (Signed)
Cardiology Office Note  Date:  09/13/2015   ID:  Joshua Vincent, DOB [birth year], MRN [id number]  PCP:  Leeroy Cha, MD   Chief Complaint  Patient presents with  . Other    Elevated BP. Meds reviewed verbally with pt.    HPI:  Joshua Vincent is a 81 y.o. male with a hx of diastolic CHF, CAD, CABG, s/p NSTEMI in 12/10 accompanied by pulmonary edema and peri-catheterization CVA,  He presents today for follow-up of his coronary artery disease.  He currently lives at [facility] [place] History of obstructive sleep apnea, unable to tolerate his CPAP Baseline creatinine greater than 2  In follow-up today, he presents with his daughter He is currently Living with Daughter, she is helping with medications When he does live at [facility] [place], they do have a visiting doctor Dr. has been making various changes to his diabetes medications Daughter reports that previous blood pressures in June and July were relatively well-controlled but she does not have many measurements She has been closely monitoring blood pressure in August and reports it is consistently elevated 0000000 up to [id number] systolic on a regular basis She is concerned as yesterday blood pressure was very elevated the morning then took his morning medications and seemed to drop drastically, remains low for much of the day  systolic [id number] were recorded In general though it has been very high throughout much of the day with most measurements that daughter provides  He reports he is well-hydrated, no regular exercise Daughter reports he is lazy. He is doing PT for pain in his left knee Sleeps late, daughter has to wake him up  Previous HBA1C was 12, now down to 9.3 Daughter reports it is even better He is taking Lasix 80 BID Creatinine 2.55, BUN 42, daughter reports these numbers have been stable   followed by nephrology, Dr. Abigail Butts.  significant memory loss  EKG on today's visit shows normal sinus rhythm with rate 73 beats minute,  right bundle branch block, left anterior fascicular block  Other past medical history admitted in 8/13 with NSTEMI. LHC showed severe disease in the RCA and occlusion of the LAD and LCX. SVG-Dx and SVG-RCA were occluded. SVG-OM and LIMA-LAD were patent. EF was 45% by LV-gram but echo done in 9/13 showed EF 55-60% .   He was admitted in 6/14 with NSTEMI (TnI to 12) and acute systolic CHF. EF was 15% by echo (newly decreased). He was begun on milrinone and IV Lasix with good diuresis. LHC showed stable anatomy with patent LIMA-LAD and SVG-OM. There was severe diffuse disease in small PDA and small PLV. No intervention. He was also admitted in 8/14 with transient aphasia. MRI did not show acute infarct. Suspected TIA secondary to small vessel disease. Echo in 8/14 showed surprising EF recovery to 50-55%.   Admitted 11/4 for A/C combined CHF. EF was 45-50% by echo. He was seen in f/u at the [facility] 11/30/12. He was to be set up to see Para-medicine.   Studies: - LHC (06/2012): Proximal LAD occluded, proximal circumflex occluded, mid RCA 30%, distal RCA 30%, PDA 90%, PL branch 1 proximal 95%, LIMA-LAD patent, SVG-OM patent (SVG-RCA and SVG-diagonal known to be occluded) - Echo (11/2012): Mild LVH, EF 45-50%, diffuse HK, grade 2 diastolic dysfunction, mild MR, moderate LAE, PASP 42 mm Hg - Carotid US (08/2012): Bilateral ICA <39%  PMH:   has a past medical history of Benign prostatic hypertrophy; Bladder cancer ([place]); CAD (coronary artery disease) (CARDIOLOGIST-  DR [facility]); CKD (chronic kidney disease), stage IV ([place]); COPD (chronic obstructive pulmonary disease) ([place]); Diabetes mellitus, type 2 ([place]); Diabetic peripheral neuropathy ([place]); Frequency of urination; GERD (gastroesophageal reflux disease); History of CVA (cerebrovascular accident) (NO RESIDUALS); History of glaucoma; History of kidney stones; History of non-ST elevation myocardial infarction (NSTEMI); History of TIA (transient  ischemic attack) (NO RESIDUAL); History of urinary retention; HLD (hyperlipidemia); Hypertension; OA (osteoarthritis); OSA (obstructive sleep apnea); PVC's (premature ventricular contractions); RBBB (right bundle branch block); and Systolic and diastolic CHF, chronic ([place]).  PSH:    Past Surgical History:  Procedure Laterality Date  . APPENDECTOMY    . CARDIAC CATHETERIZATION  12-26-2008  dr cooper   LAD 90-95%/  CFX 80%/  RCA 50-60%/  DIAG. LAD OCCLUDED  . CARDIAC CATHETERIZATION  08-19-2011   dr Cathie Olden   (NSTEMI)  SEVERE NATIVE CAD/  PATENT SVG to OM & LIMA to LAD/  OCCLUDED SVG to RCA & SVG to DIAG/   EF 40%/  NO CULPRIT LESION FOR PCI/  MED THERAPY  . CARDIAC CATHETERIZATION  07-07-2012  dr Martinique   NO SIGNIFICANT CHANGE COMPARED TO PRIOR CARDIAC CATH/  SEVERE 3V  CAD/  PATENT LIMA to LAD  &  SVG to OM (NSTEMI)  . CATARACT EXTRACTION W/ INTRAOCULAR LENS  IMPLANT, BILATERAL  2010  . CORONARY ANGIOGRAM  07/07/2012   Procedure: CORONARY ANGIOGRAM;  Surgeon: Peter M Martinique, MD;  Location: [facility] CATH LAB;  Service: Cardiovascular;;  . CORONARY ARTERY BYPASS GRAFT  02-07-2009  dr Lawson Fiscal   X4/   LIMA to LAD/  SVA to DIAG./  SVG to OM/  SVG to PLV  . CYSTO/  BALLOON DILATION URETHRAL STRICTURE/  RESECTION BLADDER TUMOR/ FULGERATION/ INSTILLATION MITOMYCIN  07-13-2009  . CYSTOSCOPY WITH BIOPSY N/A 06/03/2013   Procedure: CYSTOSCOPY WITH COLD CUP RESECTION/MITOMYCIN INSTILLATION in PACU;  Surgeon: Bernestine Amass, MD;  Location: [facility];  Service: Urology;  Laterality: N/A;  . GRAFT(S) ANGIOGRAM  07/07/2012   Procedure: GRAFT(S) ANGIOGRAM;  Surgeon: Peter M Martinique, MD;  Location: [facility] CATH LAB;  Service: Cardiovascular;;  . INGUINAL HERNIA REPAIR Left 02-17-2010  . LEFT HEART CATHETERIZATION WITH CORONARY ANGIOGRAM N/A 08/20/2011   Procedure: LEFT HEART CATHETERIZATION WITH CORONARY ANGIOGRAM;  Surgeon: Thayer Headings, MD;  Location: [facility] CATH LAB;  Service: Cardiovascular;  Laterality:  N/A;  . RETINAL DETACHMENT SURGERY  2010  . TRANSTHORACIC ECHOCARDIOGRAM  11-10-2012   MILD LVH/  EF 45-50%/  DIFFUSE/ HYPOKINESIS/ GRADE II DIASTOLIC DYSFUNCTION/  MILD MR/  MODERATE LAE/  MILD INCREASE SYSTOLIC PRESSURE  . TRANSURETHRAL RESECTION OF PROSTATE  1998    Current Outpatient Prescriptions  Medication Sig Dispense Refill  . amLODipine (NORVASC) 10 MG tablet TAKE 1 TABLET (10 MG TOTAL) BY MOUTH EVERY MORNING. 90 tablet 3  . aspirin 81 MG tablet Take 81 mg by mouth daily.    Marland Kitchen atorvastatin (LIPITOR) 80 MG tablet TAKE 1 TABLET (80 MG TOTAL) BY MOUTH EVERY MORNING. 90 tablet 3  . carvedilol (COREG) 12.5 MG tablet TAKE 1 AND 1/2 TABLETS BY MOUTH 2 (TWO) TIMES DAILY. 270 tablet 3  . Cholecalciferol (VITAMIN D3) 1000 UNITS CAPS Take 1 capsule by mouth daily.    . clopidogrel (PLAVIX) 75 MG tablet TAKE 1 TABLET (75 MG TOTAL) BY MOUTH DAILY WITH BREAKFAST. 90 tablet 3  . diclofenac sodium (VOLTAREN) 1 % GEL Apply 2 g topically daily as needed (for pain).    Marland Kitchen doxazosin (CARDURA) 8 MG tablet TAKE  1 TABLET (8 MG TOTAL) BY MOUTH AT BEDTIME. 90 tablet 3  . furosemide (LASIX) 80 MG tablet Take 80 mg by mouth 2 (two) times daily.    . hydrALAZINE (APRESOLINE) 100 MG tablet Take 1 tablet (100 mg total) by mouth 3 (three) times daily. 270 tablet 3  . Insulin Glargine (LANTUS SOLOSTAR [place]) Takes 20 units am and 10 units pm daily.    . isosorbide mononitrate (IMDUR) 60 MG 24 hr tablet Take 1 tablet (60 mg total) by mouth 2 (two) times daily. 180 tablet 3  . nitroGLYCERIN (NITROSTAT) 0.4 MG SL tablet Place 1 tablet (0.4 mg total) under the tongue every 5 (five) minutes as needed for chest pain. 25 tablet 3  . potassium chloride SA (K-DUR,KLOR-CON) 20 MEQ tablet Take 20 mEq by mouth daily.    . sertraline (ZOLOFT) 50 MG tablet Take 50 mg by mouth daily.    . vitamin C (ASCORBIC ACID) 500 MG tablet Take 500 mg by mouth daily.     No current facility-administered medications for this visit.       Allergies:   Niacin   Social History:  The patient  reports that he quit smoking about 25 years ago. His smoking use included Cigarettes. He has a 40.00 pack-year smoking history. He has never used smokeless tobacco. He reports that he drinks alcohol. He reports that he does not use drugs.   Family History:   family history includes Aneurysm in his brother and father; Heart disease in his sister; Osteoarthritis in his sister.    Review of Systems: Review of Systems  Constitutional: Negative.   Respiratory: Negative.   Cardiovascular: Negative.   Gastrointestinal: Negative.   Musculoskeletal: Negative.   Neurological: Negative.   Psychiatric/Behavioral: Negative.   All other systems reviewed and are negative. He is a poor historian, most of the details provided by patient's daughter  PHYSICAL EXAM: VS:  BP (!) 176/90 (BP Location: Left Arm, Patient Position: Sitting, Cuff Size: Normal)   Pulse 67   Ht 6' (1.829 m)   Wt 202 lb (91.6 kg)   BMI 27.40 kg/m  , BMI Body mass index is 27.4 kg/m. GEN: Well nourished, well developed, in no acute distress  HEENT: normal  Neck: no JVD, carotid bruits, or masses Cardiac: RRR; no murmurs, rubs, or gallops,no edema  Respiratory:  clear to auscultation bilaterally, normal work of breathing GI: soft, nontender, nondistended, + BS MS: no deformity or atrophy  Skin: warm and dry, no rash Neuro:  Strength and sensation are intact Psych: euthymic mood, full affect    Recent Labs: No results found for requested labs within last 8760 hours.    Lipid Panel Lab Results  Component Value Date   CHOL 141 10/22/2012   HDL 31.00 (L) 10/22/2012   LDLCALC 86 10/22/2012   TRIG 118.0 10/22/2012      Wt Readings from Last 3 Encounters:  09/13/15 202 lb (91.6 kg)  03/17/15 202 lb 1.9 oz (91.7 kg)  06/10/14 196 lb 8 oz (89.1 kg)       ASSESSMENT AND PLAN:  Essential hypertension - Plan: EKG 12-Lead Markedly elevated blood  pressure. We have recommended he increase hydralazine up to 100 mg 3 times a day, also increase Imdur up to 60 mg twice a day. Daughter will closely monitor blood pressure with each change. If blood pressure continues to run high, one option would be to slowly increase dosing of Cardura up to 8 mg twice a day  Atherosclerosis of native coronary artery of native heart without angina pectoris - Plan: EKG 12-Lead Currently with no symptoms of angina. No further workup at this time. Continue current medication regimen.  Hyperlipidemia No recent lipid panel available. Previous measurements in 2015 showed cholesterol at goal No changes made to his medications  Chronic combined systolic and diastolic heart failure (HCC) Currently taking Lasix 80 mg twice a day. No clear signs of fluid overload. Unclear if he is prerenal as creatinine is above his baseline. Suggested that he miss a dose of diuretics here and there given he has been relatively stable. Potentially could decrease the dose down to 80 mg daily  Uncontrolled type 2 diabetes mellitus with complication, with long-term current use of insulin (HCC) stressed importance of diet restriction Daughter reports dramatic improvement in his numbers since he has been living with her Also recent changes to her diabetes regimen  S/P CABG (coronary artery bypass graft) Denies having any symptoms concerning for angina. No further workup at this time    Total encounter time more than 25 minutes  Greater than 50% was spent in counseling and coordination of care with the patient   Disposition:   F/U  6 months   Orders Placed This Encounter  Procedures  . EKG 12-Lead     Signed, Esmond Plants, M.D., Ph.D. 09/13/2015  [place], [place]

## 2015-09-13 NOTE — Patient Instructions (Addendum)
Medication Instructions:   Please increase the hydralazine up to 100 mg three times a day Increase the imdur up to 60 mg twice a day  If needed, increase the cardura up to 8 mg twice a day   Labwork:  No new labs needed  Testing/Procedures:  No further testing at this time   Follow-Up: It was a pleasure seeing you in the office today. Please call us if you have new issues that need to be addressed before your next appt.  [phone]  Your physician wants you to follow-up in: 6 months.  You will receive a reminder letter in the mail two months in advance. If you don't receive a letter, please call our office to schedule the follow-up appointment.  If you need a refill on your cardiac medications before your next appointment, please call your pharmacy.

## 2015-09-22 ENCOUNTER — Other Ambulatory Visit: Payer: Self-pay

## 2015-09-22 ENCOUNTER — Telehealth: Payer: Self-pay | Admitting: Cardiovascular Disease

## 2015-09-22 MED ORDER — FUROSEMIDE 80 MG PO TABS: 80.0000 mg | ORAL_TABLET | Freq: Two times a day (BID) | ORAL | 3 refills | Status: AC

## 2015-09-22 MED ORDER — DOXAZOSIN MESYLATE 8 MG PO TABS: ORAL_TABLET | ORAL | 3 refills | Status: AC

## 2015-09-22 NOTE — Telephone Encounter (Signed)
Paperwork is on your desk in your basket.

## 2015-09-22 NOTE — Telephone Encounter (Signed)
Pt daughter faxed some BP readings on Tues 9/12 and was wondering if Dr. Rockey Situ has looked at them. Please call and advise.

## 2015-09-22 NOTE — Telephone Encounter (Signed)
States pt is taking 2 Doxazosin per day 90 day supply for both

## 2015-09-23 NOTE — Telephone Encounter (Signed)
Left detailed message on Joshua Vincent's vm that Dr. Rockey Situ reviewed BP readings. Advised her that readings have improved and are moving in the right direction. Asked her to obtain some orthostatic readings over the weekend and call on Monday w/ these.

## 2015-09-27 ENCOUNTER — Telehealth: Payer: Self-pay | Admitting: Cardiovascular Disease

## 2015-09-27 NOTE — Telephone Encounter (Signed)
PHysical therapist states pt BP was 190/102 HR 85, oxygen was 97. These readings were today and no therapy was given today. She states a message was left with pt POA, but has not heard a response. Please call.

## 2015-09-27 NOTE — Telephone Encounter (Signed)
Please see previous phone note.  

## 2015-10-11 ENCOUNTER — Ambulatory Visit: Payer: Self-pay | Admitting: Cardiovascular Disease

## 2016-01-05 ENCOUNTER — Encounter: Payer: Self-pay | Admitting: Cardiovascular Disease

## 2016-01-05 ENCOUNTER — Other Ambulatory Visit: Payer: Self-pay

## 2016-01-05 MED ORDER — CARVEDILOL 12.5 MG PO TABS: ORAL_TABLET | ORAL | 0 refills | Status: DC

## 2016-01-05 MED ORDER — CLOPIDOGREL BISULFATE 75 MG PO TABS: ORAL_TABLET | ORAL | 0 refills | Status: DC

## 2016-01-19 ENCOUNTER — Other Ambulatory Visit: Payer: Self-pay | Admitting: Cardiovascular Disease

## 2016-03-17 ENCOUNTER — Other Ambulatory Visit: Payer: Self-pay | Admitting: Cardiovascular Disease

## 2016-03-19 NOTE — Telephone Encounter (Signed)
Pt needs f/u appt with Dr.Gollan. Thanks!

## 2016-03-26 NOTE — Telephone Encounter (Signed)
Pt is not due until September 2018

## 2016-03-31 ENCOUNTER — Other Ambulatory Visit: Payer: Self-pay | Admitting: Cardiovascular Disease

## 2016-04-02 NOTE — Telephone Encounter (Signed)
Pt needs f/u appt with Gollan. Thanks 

## 2016-04-13 NOTE — Telephone Encounter (Signed)
Lmov for patient to call back and schedule appointment ° ° °

## 2016-04-30 NOTE — Telephone Encounter (Signed)
Pt POA Joshua Vincent states pt has moved and has a new cardiologist that patient will see in about 3 weeks

## 2016-06-20 ENCOUNTER — Other Ambulatory Visit: Payer: Self-pay | Admitting: Cardiovascular Disease

## 2016-08-15 ENCOUNTER — Other Ambulatory Visit: Payer: Self-pay | Admitting: Cardiovascular Disease

## 2016-08-24 ENCOUNTER — Other Ambulatory Visit: Payer: Self-pay | Admitting: Cardiovascular Disease

## 2016-10-13 ENCOUNTER — Other Ambulatory Visit: Payer: Self-pay | Admitting: Cardiovascular Disease

## 2016-10-15 ENCOUNTER — Telehealth: Payer: Self-pay | Admitting: Cardiovascular Disease

## 2016-10-15 NOTE — Telephone Encounter (Signed)
Patient was last seen by [facility] 09/27/2015 and needed to follow up in 6 months. Patiwnt never follow back up but looks like to be receiving care from [facility].  Should we continue refilling this medication?

## 2016-10-15 NOTE — Telephone Encounter (Signed)
Pharmacy requests coreg refill. Pt last saw Dr. Rockey Situ Sept 2017. Follows up with [facility]. Left message for Bertram Haddix, [place], to contact the office.

## 2018-10-09 DEATH — deceased
